# Patient Record
Sex: Male | Born: 1960
Health system: Southern US, Community
[De-identification: ages and names within clinical notes are randomized; demographics above are authoritative.]

## PROBLEM LIST (undated history)

## (undated) DIAGNOSIS — E0500 Thyrotoxicosis with diffuse goiter without thyrotoxic crisis or storm: Secondary | ICD-10-CM

## (undated) DIAGNOSIS — J189 Pneumonia, unspecified organism: Secondary | ICD-10-CM

## (undated) DIAGNOSIS — R1310 Dysphagia, unspecified: Secondary | ICD-10-CM

## (undated) DIAGNOSIS — E039 Hypothyroidism, unspecified: Secondary | ICD-10-CM

## (undated) DIAGNOSIS — E7800 Pure hypercholesterolemia, unspecified: Secondary | ICD-10-CM

## (undated) DIAGNOSIS — G35 Multiple sclerosis: Secondary | ICD-10-CM

## (undated) DIAGNOSIS — M6281 Muscle weakness (generalized): Secondary | ICD-10-CM

## (undated) HISTORY — PX: APPENDECTOMY: SHX54

---

## 2000-04-29 ENCOUNTER — Encounter: Admission: RE | Admit: 2000-04-29 | Discharge: 2000-04-29 | Payer: Self-pay | Admitting: Neurology

## 2000-06-21 ENCOUNTER — Emergency Department (HOSPITAL_COMMUNITY): Admission: EM | Admit: 2000-06-21 | Discharge: 2000-06-22 | Payer: Self-pay | Admitting: Emergency Medicine

## 2000-10-06 ENCOUNTER — Emergency Department (HOSPITAL_COMMUNITY): Admission: EM | Admit: 2000-10-06 | Discharge: 2000-10-07 | Payer: Self-pay | Admitting: Emergency Medicine

## 2000-10-14 ENCOUNTER — Encounter: Payer: Self-pay | Admitting: Neurology

## 2000-10-14 ENCOUNTER — Ambulatory Visit (HOSPITAL_COMMUNITY): Admission: RE | Admit: 2000-10-14 | Discharge: 2000-10-14 | Payer: Self-pay | Admitting: Neurology

## 2000-11-20 ENCOUNTER — Encounter: Payer: Self-pay | Admitting: Neurosurgery

## 2000-11-20 ENCOUNTER — Observation Stay (HOSPITAL_COMMUNITY): Admission: RE | Admit: 2000-11-20 | Discharge: 2000-11-20 | Payer: Self-pay | Admitting: Neurosurgery

## 2001-10-15 ENCOUNTER — Encounter: Admission: RE | Admit: 2001-10-15 | Discharge: 2001-10-15 | Payer: Self-pay | Admitting: Neurology

## 2002-11-22 ENCOUNTER — Encounter: Admission: RE | Admit: 2002-11-22 | Discharge: 2003-01-05 | Payer: Self-pay | Admitting: Psychiatry

## 2005-09-26 ENCOUNTER — Emergency Department (HOSPITAL_COMMUNITY): Admission: EM | Admit: 2005-09-26 | Discharge: 2005-09-27 | Payer: Self-pay | Admitting: Emergency Medicine

## 2006-06-15 ENCOUNTER — Encounter: Admission: RE | Admit: 2006-06-15 | Discharge: 2006-07-28 | Payer: Self-pay | Admitting: Neurology

## 2010-05-31 NOTE — Op Note (Signed)
[facility]  Patient:    Mark Kramer, Mark Kramer Visit Number: [id number] MRN: [id number]          Service Type: SUR Location: [facility] 3172 03 Attending Physician:  Abran Duke Dictated by:   Earnie Larsson, M.D. Proc. Date: 11/20/00 Admit Date:  11/20/2000 Discharge Date: 11/20/2000                             Operative Report  PREOPERATIVE DIAGNOSIS:  Right V3 trigeminal neuralgia.  POSTOPERATIVE DIAGNOSIS:  Right V3 trigeminal neuralgia.  PROCEDURE:  Right trigeminal balloon rhizotomy.  SURGEON:  Earnie Larsson, M.D.  ANESTHESIA:  General endotracheal.  INDICATIONS:  The patient is a 50 year old black male with multiple sclerosis who has intractable right-sided V3 distribution trigeminal neuralgia which has failed medical management.  The patient presents now for a right-sided balloon compression rhizotomy in hopes of alleviating some of his symptoms.  DESCRIPTION OF PROCEDURE:  The patient was brought to the operating room and placed on the operating table in the supine position.  After an adequate level of anesthesia was achieved, the patients left cheek was prepped and draped sterilely.  A 14-gauge Tuohy needle was then inserted approximately 2 cm lateral to the angle of his mouth, and passed along the jaw line along the superior border of his mandible.  This tracked along the pterygoid plate.  The needle was then directed under fluoroscopic guidance into the foramen ovale. This was confirmed by fluoroscopic guidance in both AP and lateral plane.  A #4 Fogarty catheter was then passed through the needle to the proper depth and entered into Meckels cave.  The balloon was inflated with radiopaque dye forming a classic pear shape confirmation within Meckels cave.  Compression continued for one minute total.  The balloon was deflated and the needle was removed.  There was no complication.  The patient tolerated the procedure well.  He returned to  the recovery room postoperative. Dictated by:   Earnie Larsson, M.D. Attending Physician:  Abran Duke DD:  11/20/00 TD:  11/21/00 Job: [id number]

## 2011-04-18 ENCOUNTER — Emergency Department (HOSPITAL_COMMUNITY)
Admission: EM | Admit: 2011-04-18 | Discharge: 2011-04-18 | Disposition: A | Discharge status: Home / Self Care | Payer: Managed Care, Other (non HMO) | Attending: Emergency Medicine | Admitting: Emergency Medicine

## 2011-04-18 ENCOUNTER — Encounter (HOSPITAL_COMMUNITY): Payer: Self-pay | Admitting: Emergency Medicine

## 2011-04-18 DIAGNOSIS — E0500 Thyrotoxicosis with diffuse goiter without thyrotoxic crisis or storm: Secondary | ICD-10-CM | POA: Insufficient documentation

## 2011-04-18 DIAGNOSIS — R5381 Other malaise: Secondary | ICD-10-CM | POA: Insufficient documentation

## 2011-04-18 DIAGNOSIS — G35 Multiple sclerosis: Secondary | ICD-10-CM

## 2011-04-18 HISTORY — DX: Multiple sclerosis: G35

## 2011-04-18 HISTORY — DX: Thyrotoxicosis with diffuse goiter without thyrotoxic crisis or storm: E05.00

## 2011-04-18 LAB — CBC
HCT: 49 % (ref 39.0–52.0)
Hemoglobin: 15.9 g/dL (ref 13.0–17.0)
MCH: 28.3 pg (ref 26.0–34.0)
MCHC: 32.4 g/dL (ref 30.0–36.0)
MCV: 87.2 fL (ref 78.0–100.0)
Platelets: 211 10*3/uL (ref 150–400)
RBC: 5.62 MIL/uL (ref 4.22–5.81)
RDW: 14 % (ref 11.5–15.5)
WBC: 5.6 10*3/uL (ref 4.0–10.5)

## 2011-04-18 LAB — BASIC METABOLIC PANEL
BUN: 10 mg/dL (ref 6–23)
Calcium: 9.9 mg/dL (ref 8.4–10.5)
Chloride: 100 mEq/L (ref 96–112)
Creatinine, Ser: 0.84 mg/dL (ref 0.50–1.35)
GFR calc Af Amer: >90 mL/min (ref >90)
GFR calc non Af Amer: >90 mL/min (ref >90)
Sodium: 141 mEq/L (ref 135–145)

## 2011-04-18 MED ORDER — PREDNISONE 20 MG PO TABS
60.0000 mg | ORAL_TABLET | Freq: Once | ORAL | Status: AC
  Administered 2011-04-18: 60 mg via ORAL
  Filled 2011-04-18: qty 3

## 2011-04-18 MED ORDER — PREDNISONE 10 MG PO TABS: 60.0000 mg | ORAL_TABLET | Freq: Every day | ORAL | Status: DC

## 2011-04-18 NOTE — ED Notes (Signed)
Per EMS.  Pt fell this am before going to adult daycare.  Pt denies complaints, pain or injury.  Pt has MS and is very shaky.  Pt did not want to come, but mother wanted pt to go.  Pt has been living by himself for the past month due to a separation, and is trying to get placement.  Pt stated to EMS that he has been talking to someone, but has not gotten to placement yet.  EMS states pt's living conditions unsafe, and pt is unable to take care of self.

## 2011-04-18 NOTE — Progress Notes (Signed)
Completed [facility] Referral to [facility] with confirmation Voice message left at [phone] to informing of change to [place] and requesting a return call to get update contact numbers for pt both home and mobile numbers listed on face sheet are disconnected

## 2011-04-18 NOTE — Progress Notes (Signed)
Called in home health referral to [facility] but services could not be started until next week for PT Called in referral to second choice of [place] Spoke with Debbie to complete referral Nursing to see pt first this weekend and other services to start soon after Pt updated and has contact information

## 2011-04-18 NOTE — ED Notes (Signed)
Case manager called.  Pt wants inpatient placement and needs social work consult.  Will tell MD.

## 2011-04-18 NOTE — ED Provider Notes (Signed)
History     CSN: [id number]  Arrival date & time 04/18/11  0917   First MD Initiated Contact with Patient 04/18/11 [phone]      Chief Complaint  Patient presents with  . Fall    The history is provided by the patient.   the patient has a history of multiple sclerosis and while he was on his way to adult daycare today he noticed he was more weak than usual.  He reports this is consistent with his MS exacerbations.  He is currently not on steroids.  His neurologist is in [place].  He currently lives at home by himself and his mother and his separated wife occasionally check in on him.  He would like to go to an assisted-living center and they have been looking into placement at an assisted living center but have been unsuccessful finding a place for this time.  He denies headache or confusion.  He's had no recent trauma.  He denies neck pain and back pain.  Past Medical History  Diagnosis Date  . Multiple sclerosis   . Grave's disease     History reviewed. No pertinent past surgical history.  History reviewed. No pertinent family history.  History  Substance Use Topics  . Smoking status: Never Smoker   . Smokeless tobacco: Not on file  . Alcohol Use: No      Review of Systems  All other systems reviewed and are negative.    Allergies  Review of patient's allergies indicates no known allergies.  Home Medications   Current Outpatient Rx  Name Route Sig Dispense Refill  . AMPHETAMINE-DEXTROAMPHETAMINE 20 MG PO TABS Oral Take 20 mg by mouth daily.    [name] CLONAZEPAM 0.5 MG PO TABS Oral Take 0.5 mg by mouth 2 (two) times daily as needed.    [name] DEXTROMETHORPHAN-QUINIDINE 20-10 MG PO CAPS Oral Take 1 capsule by mouth daily.    [name] ESCITALOPRAM OXALATE 20 MG PO TABS Oral Take 20 mg by mouth daily.    . IMIPRAMINE HCL 25 MG PO TABS Oral Take 50 mg by mouth at bedtime.    [name] LEVOTHYROXINE SODIUM 112 MCG PO TABS Oral Take 112 mcg by mouth daily.    [name] NADOLOL 80 MG PO  TABS Oral Take 80 mg by mouth daily.    [name] PRAVASTATIN SODIUM 20 MG PO TABS Oral Take 20 mg by mouth daily.    . QUETIAPINE FUMARATE 50 MG PO TABS Oral Take 50 mg by mouth at bedtime.      BP 143/80  Pulse 73  Temp(Src) 98.2 F (36.8 C) (Oral)  Resp 16  SpO2 100%  Physical Exam  Nursing note and vitals reviewed. Constitutional: He is oriented to person, place, and time. He appears well-developed and well-nourished.  HENT:  Head: Normocephalic and atraumatic.  Eyes: EOM are normal.  Neck: Normal range of motion.  Cardiovascular: Normal rate, regular rhythm, normal heart sounds and intact distal pulses.   Pulmonary/Chest: Effort normal and breath sounds normal. No respiratory distress.  Abdominal: Soft. He exhibits no distension. There is no tenderness.  Musculoskeletal: Normal range of motion.  Neurological: He is alert and oriented to person, place, and time.       Mild weakness of his bilateral upper and lower extremities without focal deficits  Skin: Skin is warm and dry.  Psychiatric: He has a normal mood and affect. Judgment normal.    ED Course  Procedures (including critical care time)  Labs Reviewed  CBC  BASIC METABOLIC PANEL   No results found.   1. Multiple sclerosis exacerbation       MDM  The patient is having exacerbation of his multiple sclerosis and requires additional assistance at home.  I spoke with both case management and social work to work on these issues.  Social work states is not a candidate due to his insurance time for placement into an assisted living facility at this time.  Case management was able to evaluate the patient and provide home health assistance to begin tomorrow.  The patient's mother is in the room and she will take him home back to [place] and return him back to his home and home health aides present tomorrow for additional assistance.  They will continue to work as an outpatient on placement into an assisted living  facility.  Will place the patient on prednisone for his multiple sclerosis exacerbation.  He has walkers and a wheelchair at home. I Have recommended close followup with his neurologist        Hoy Morn, MD 04/18/11 [phone]

## 2011-04-18 NOTE — ED Notes (Signed)
[id number] Expected date:<BR> Expected time:<BR> Means of arrival:<BR> Comments:<BR> Fall/ ems

## 2011-04-18 NOTE — Progress Notes (Signed)
ED CM updated ED RN. RN states labs just drawn for pt.  SW consult order needed

## 2011-04-18 NOTE — ED Notes (Signed)
Patient can stand in place with assistance although can not place any weigh on left leg.

## 2011-04-18 NOTE — Progress Notes (Signed)
ED CM made attempts throughout day to reach pt or mother at numbers provide mobile [phone] incorrect number) (home [phone] disconnected- old number for wife Mark Kramer) No return call from Mother Mark Kramer after vm x 1 left [phone]  Cm also attempted to reach pt's adult day care at [phone] which is a incorrect number and his pcp [facility] on [address] at [phone] which is an incorrect number Cm believes correct pcp is [facility] [phone] (Office) [phone] (Fax)

## 2011-04-18 NOTE — Progress Notes (Signed)
ED Cm received consult from Dr Venora Maples to assist pt with Home health services. Pt has [employer] managed coverage.  Cm spoke with pt and male friend at bedside about CM consult.  He has not used a home health agency previously He does not have medicaid coverage.  Pt states he prefers to go to assisted living states he has been working with a [place], Mr Laurann Montana at [phone] to get to an assisted living facility but can not recall the name of the assisted living facility. CM discussed process of referral to Assisted living with pt.  CM spoke with Dr Venora Maples about pt choice and left message for ED SW

## 2011-04-18 NOTE — ED Notes (Signed)
Talked to case management.  Pt to go home with mother for the night and home health to come to pt home tomorrow.

## 2011-04-18 NOTE — Progress Notes (Signed)
CSW spoke with patient and family who were bedside. Pt expressed interest in ALF, however, insurance will not pay for it.  Also, pt is said to make too much to qualify for Medicare A&B. CSW provided patient with list of ALF/FCH/RH in [facility]. CSW also reviewed steps on placement into these facilities so they can pursue if pt becomes eligible for Medicare which they are currently working on.  CSW provided family with list of habilitation services and advocacy groups in the area. Family appreciative for information and expressed no further needs at this time.  CSW signing off.  [place] Kenora Spayd ANN S , MSW, LCSWA 04/18/2011  12:16 PM [phone]

## 2011-05-20 ENCOUNTER — Encounter (HOSPITAL_COMMUNITY): Payer: Self-pay | Admitting: *Deleted

## 2011-05-20 ENCOUNTER — Emergency Department (HOSPITAL_COMMUNITY)
Admission: EM | Admit: 2011-05-20 | Discharge: 2011-05-20 | Disposition: A | Discharge status: Home / Self Care | Payer: Managed Care, Other (non HMO) | Attending: Emergency Medicine | Admitting: Emergency Medicine

## 2011-05-20 DIAGNOSIS — W010XXA Fall on same level from slipping, tripping and stumbling without subsequent striking against object, initial encounter: Secondary | ICD-10-CM | POA: Insufficient documentation

## 2011-05-20 DIAGNOSIS — S0990XA Unspecified injury of head, initial encounter: Secondary | ICD-10-CM | POA: Insufficient documentation

## 2011-05-20 DIAGNOSIS — S0081XA Abrasion of other part of head, initial encounter: Secondary | ICD-10-CM

## 2011-05-20 DIAGNOSIS — IMO0002 Reserved for concepts with insufficient information to code with codable children: Secondary | ICD-10-CM | POA: Insufficient documentation

## 2011-05-20 DIAGNOSIS — W19XXXA Unspecified fall, initial encounter: Secondary | ICD-10-CM

## 2011-05-20 DIAGNOSIS — G35 Multiple sclerosis: Secondary | ICD-10-CM | POA: Insufficient documentation

## 2011-05-20 DIAGNOSIS — E0500 Thyrotoxicosis with diffuse goiter without thyrotoxic crisis or storm: Secondary | ICD-10-CM | POA: Insufficient documentation

## 2011-05-20 NOTE — Progress Notes (Signed)
ED CM consulted with ED SW.  CM spoke with pt who states he did go to his mother's home after 04/18/11 ED visit Reports he was not seen by home health services per his mother decision. Then reports his mother got married and she assisted with getting him into a group home.  He reports not being happy at the group home but is a better situation than his living situation prior to 04/18/11.  Dr Venora Maples in to see pt

## 2011-05-20 NOTE — Progress Notes (Signed)
ED CM contacted by EMS staff about pt stating pt may need assisted living versus group home he is presently in. Referred to ED SW Pt has been seen by ED SW and ED CM previously and his coverage would not assist with disposition to assisted living CM previously assisted with home health services see 04/18/11 notes

## 2011-05-20 NOTE — ED Notes (Signed)
Pt has a noted abrasion to left cheek adjacent to eye

## 2011-05-20 NOTE — ED Notes (Signed)
Per EMS- pt in s/p fall, states he was getting up to lunch and he slipped, pt has MS, pt stays at a group home, pt with abrasions noted to left side of face, denies LOC or neck pain

## 2011-05-20 NOTE — Discharge Instructions (Signed)
Abrasions Abrasions are skin scrapes. Their treatment depends on how large and deep the abrasion is. Abrasions do not extend through all layers of the skin. A cut or lesion through all skin layers is called a laceration. HOME CARE INSTRUCTIONS   If you were given a dressing, change it at least once a day or as instructed by your caregiver. If the bandage sticks, soak it off with a solution of water or hydrogen peroxide.   Twice a day, wash the area with soap and water to remove all the cream/ointment. You may do this in a sink, under a tub faucet, or in a shower. Rinse off the soap and pat dry with a clean towel. Look for signs of infection (see below).   Reapply cream/ointment according to your caregiver's instruction. This will help prevent infection and keep the bandage from sticking. Telfa or gauze over the wound and under the dressing or wrap will also help keep the bandage from sticking.   If the bandage becomes wet, dirty, or develops a foul smell, change it as soon as possible.   Only take over-the-counter or prescription medicines for pain, discomfort, or fever as directed by your caregiver.  SEEK IMMEDIATE MEDICAL CARE IF:   Increasing pain in the wound.   Signs of infection develop: redness, swelling, surrounding area is tender to touch, or pus coming from the wound.   You have a fever.   Any foul smell coming from the wound or dressing.  Most skin wounds heal within ten days. Facial wounds heal faster. However, an infection may occur despite proper treatment. You should have the wound checked for signs of infection within 24 to 48 hours or sooner if problems arise. If you were not given a wound-check appointment, look closely at the wound yourself on the second day for early signs of infection listed above. MAKE SURE YOU:   Understand these instructions.   Will watch your condition.   Will get help right away if you are not doing well or get worse.  Document Released:  10/09/2004 Document Revised: 12/19/2010 Document Reviewed: 12/03/2010 [facility] Patient Information 2012 [place].Head Injury, Adult You have had a head injury that does not appear serious at this time. A concussion is a state of changed mental ability, usually from a blow to the head. You should take clear liquids for the rest of the day and then resume your regular diet. You should not take sedatives or alcoholic beverages for as long as directed by your caregiver after discharge. After injuries such as yours, most problems occur within the first 24 hours. SYMPTOMS These minor symptoms may be experienced after discharge:  Memory difficulties.   Dizziness.   Headaches.   Double vision.   Hearing difficulties.   Depression.   Tiredness.   Weakness.   Difficulty with concentration.  If you experience any of these problems, you should not be alarmed. A concussion requires a few days for recovery. Many patients with head injuries frequently experience such symptoms. Usually, these problems disappear without medical care. If symptoms last for more than one day, notify your caregiver. See your caregiver sooner if symptoms are becoming worse rather than better. HOME CARE INSTRUCTIONS   During the next 24 hours you must stay with someone who can watch you for the warning signs listed below.  Although it is unlikely that serious side effects will occur, you should be aware of signs and symptoms which may necessitate your return to this location. Side effects  may occur up to 7 - 10 days following the injury. It is important for you to carefully monitor your condition and contact your caregiver or seek immediate medical attention if there is a change in your condition. SEEK IMMEDIATE MEDICAL CARE IF:   There is confusion or drowsiness.   You can not awaken the injured person.   There is nausea (feeling sick to your stomach) or continued, forceful vomiting.   You notice dizziness or  unsteadiness which is getting worse, or inability to walk.   You have convulsions or unconsciousness.   You experience severe, persistent headaches not relieved by over-the-counter or prescription medicines for pain. (Do not take aspirin as this impairs clotting abilities). Take other pain medications only as directed.   You can not use arms or legs normally.   There is clear or bloody discharge from the nose or ears.  MAKE SURE YOU:   Understand these instructions.   Will watch your condition.   Will get help right away if you are not doing well or get worse.  Document Released: 12/30/2004 Document Revised: 12/19/2010 Document Reviewed: 11/17/2008 [facility] Patient Information 2012 [place].

## 2011-05-20 NOTE — ED Provider Notes (Signed)
History     CSN: [id number]  Arrival date & time 05/20/11  1236   First MD Initiated Contact with Patient 05/20/11 1537      Chief Complaint  Patient presents with  . Fall     The history is provided by the patient.   the patient has multiple sclerosis and reports he was getting up to go to lunch when he slipped.  He landed on the left side of his face and resulted in abrasions and left-sided his face.  He denies headache.  He had no loss consciousness.  He has no neck pain at this time.  He denies weakness of his upper lower extremities.  He denies chest pain or shortness of breath.  He denies palpitations.  Is not using anticoagulants.  He's had no recent illness.  He reports occasionally he has generalized weakness from his MS. his symptoms are mild at this time.  He is not requesting anything for pain.  Nothing worsens the symptoms.  Nothing improves his symptoms  Past Medical History  Diagnosis Date  . Multiple sclerosis   . Grave's disease     History reviewed. No pertinent past surgical history.  History reviewed. No pertinent family history.  History  Substance Use Topics  . Smoking status: Never Smoker   . Smokeless tobacco: Not on file  . Alcohol Use: No      Review of Systems  All other systems reviewed and are negative.    Allergies  Review of patient's allergies indicates no known allergies.  Home Medications   Current Outpatient Rx  Name Route Sig Dispense Refill  . AMPHETAMINE-DEXTROAMPHETAMINE 20 MG PO TABS Oral Take 20 mg by mouth daily.    [name] CLONAZEPAM 0.5 MG PO TABS Oral Take 0.5 mg by mouth 2 (two) times daily as needed.    [name] DEXTROMETHORPHAN-QUINIDINE 20-10 MG PO CAPS Oral Take 1 capsule by mouth daily.    [name] ESCITALOPRAM OXALATE 20 MG PO TABS Oral Take 20 mg by mouth daily.    . IMIPRAMINE HCL 25 MG PO TABS Oral Take 50 mg by mouth at bedtime.    [name] LEVOTHYROXINE SODIUM 112 MCG PO TABS Oral Take 112 mcg by mouth daily.    [name] NADOLOL 80 MG PO  TABS Oral Take 80 mg by mouth daily.    [name] PRAVASTATIN SODIUM 20 MG PO TABS Oral Take 20 mg by mouth daily.    . QUETIAPINE FUMARATE 50 MG PO TABS Oral Take 50 mg by mouth at bedtime.      BP 147/96  Pulse 61  Temp(Src) 98.3 F (36.8 C) (Oral)  Resp 15  SpO2 100%  Physical Exam  Nursing note and vitals reviewed. Constitutional: He is oriented to person, place, and time. He appears well-developed and well-nourished.  HENT:  Head: Normocephalic and atraumatic.       Superficial abrasions to the left side of the face.  Extraocular movements are intact.  No trismus or malocclusion.  Dentition is normal.  No soft tissue tenderness or swelling  Eyes: EOM are normal.  Neck: Normal range of motion. Neck supple.       No cervical spine tenderness.  C-spine is cleared by Nexus criteria  Cardiovascular: Normal rate, regular rhythm, normal heart sounds and intact distal pulses.   Pulmonary/Chest: Effort normal and breath sounds normal. No respiratory distress.  Abdominal: Soft. He exhibits no distension. There is no tenderness.  Musculoskeletal: Normal range of motion.  Neurological: He is alert and  oriented to person, place, and time.  Skin: Skin is warm and dry.  Psychiatric: He has a normal mood and affect. Judgment normal.    ED Course  Procedures (including critical care time)  Labs Reviewed - No data to display No results found.   1. Fall   2. Minor head injury   3. Abrasion of face       MDM  Minor head injury.  No loss of consciousness.  No indication for head CT.  Patient is not on anticoagulants.  Infection and head injury warnings were given.  Will provide standard wound care at bedside to his abrasions.  C-spine is cleared by Nexus criteria.  The focal signs mechanical in nature.  He has no weakness in his upper lower extremity.        Hoy Morn, MD 05/20/11 [phone]

## 2011-09-09 ENCOUNTER — Encounter (HOSPITAL_COMMUNITY): Payer: Self-pay | Admitting: Emergency Medicine

## 2011-09-09 ENCOUNTER — Emergency Department (HOSPITAL_COMMUNITY)
Admission: EM | Admit: 2011-09-09 | Discharge: 2011-09-09 | Disposition: A | Discharge status: Home / Self Care | Payer: Managed Care, Other (non HMO) | Attending: Emergency Medicine | Admitting: Emergency Medicine

## 2011-09-09 DIAGNOSIS — W1809XA Striking against other object with subsequent fall, initial encounter: Secondary | ICD-10-CM | POA: Insufficient documentation

## 2011-09-09 DIAGNOSIS — Z79899 Other long term (current) drug therapy: Secondary | ICD-10-CM | POA: Insufficient documentation

## 2011-09-09 DIAGNOSIS — G35 Multiple sclerosis: Secondary | ICD-10-CM | POA: Insufficient documentation

## 2011-09-09 DIAGNOSIS — E7800 Pure hypercholesterolemia, unspecified: Secondary | ICD-10-CM | POA: Insufficient documentation

## 2011-09-09 DIAGNOSIS — Y9301 Activity, walking, marching and hiking: Secondary | ICD-10-CM | POA: Insufficient documentation

## 2011-09-09 DIAGNOSIS — S01119A Laceration without foreign body of unspecified eyelid and periocular area, initial encounter: Secondary | ICD-10-CM | POA: Insufficient documentation

## 2011-09-09 DIAGNOSIS — S01111A Laceration without foreign body of right eyelid and periocular area, initial encounter: Secondary | ICD-10-CM

## 2011-09-09 DIAGNOSIS — W19XXXA Unspecified fall, initial encounter: Secondary | ICD-10-CM

## 2011-09-09 DIAGNOSIS — E0500 Thyrotoxicosis with diffuse goiter without thyrotoxic crisis or storm: Secondary | ICD-10-CM | POA: Insufficient documentation

## 2011-09-09 DIAGNOSIS — S058X9A Other injuries of unspecified eye and orbit, initial encounter: Secondary | ICD-10-CM | POA: Diagnosis not present

## 2011-09-09 HISTORY — DX: Pure hypercholesterolemia, unspecified: E78.00

## 2011-09-09 MED ORDER — LIDOCAINE-EPINEPHRINE-TETRACAINE (LET) SOLUTION
3.0000 mL | Freq: Once | NASAL | Status: AC
  Administered 2011-09-09: 3 mL via TOPICAL
  Filled 2011-09-09: qty 3

## 2011-09-09 NOTE — ED Notes (Signed)
Pt fell walking to restroom this am. Small abrasion over right eyebrow. No loc. History of MS.

## 2011-09-09 NOTE — ED Notes (Signed)
Patient being discharged-[facility] called. Staff Arlyss Repress informed of discharge and discharge instructions. Per staff EMS to be called for transport. EDP made aware.

## 2011-09-09 NOTE — ED Notes (Signed)
[facility] called and notified of EMS transport, Suanne Marker now working also given discharge instructions-verbalized understanding.

## 2011-09-09 NOTE — ED Provider Notes (Signed)
History  This chart was scribed for [name], MD by [name]. This patient was seen in room APA14/APA14 and the patient's care was started at 0758.   CSN: [id number]  Arrival date & time 09/09/11  [id number]   First MD Initiated Contact with Patient 09/09/11 [phone]      Chief Complaint  Patient presents with  . Fall   Patient is a 51 y.o. male presenting with fall. The history is provided by the patient and the EMS personnel. No language interpreter was used.  Fall The accident occurred 1 to 2 hours ago. The fall occurred while walking. He landed on a hard floor. There was no blood loss. Point of impact: small laceration right upper eyelid.  Pain location: no pain.  The patient is experiencing no pain. There was no entrapment after the fall. Pertinent negatives include no visual change, no fever, no abdominal pain, no nausea, no vomiting, no headaches and no loss of consciousness. Treatment on scene includes a c-collar and a backboard. He has tried nothing for the symptoms.   Mark Kramer is a 51 y.o. male brought in by ambulance, who presents to the Emergency Department complaining of a fall while using his walker to ambulate to the restroom this AM. He states that he fell to the floor and denies any LOC. He uses a walker primarily for his MS. He denies pain anywhere over his body, nausea, emesis, diarrhea, chest pain, weakness, dizziness, neck pain, HA and visual disturbances. He reports that he had little sleep last night because his roommate kept him up. He presents on long board and with C-collar in place by EMS.  PCP at [facility] family [facility], Dr. Zadie Rhine Dr. Leonie Man neurologist Dr. Mariea Stable his neurologist  Past Medical History  Diagnosis Date  . Multiple sclerosis   . Grave's disease   . High cholesterol     History reviewed. No pertinent past surgical history.  No family history on file.  History  Substance Use Topics  . Smoking status: Never Smoker   . Smokeless tobacco: Not  on file  . Alcohol Use: No   Lives in ALF Uses a walker  Review of Systems  Constitutional: Negative for fever and chills.  HENT: Negative for congestion and neck pain.   Respiratory: Negative for shortness of breath.   Cardiovascular: Negative for chest pain.  Gastrointestinal: Negative for nausea, vomiting, abdominal pain and diarrhea.  Musculoskeletal: Negative for back pain.  Skin: Positive for wound (Small laceration right upper eyelid. ).  Neurological: Negative for loss of consciousness, weakness and headaches.  All other systems reviewed and are negative.    Allergies  Review of patient's allergies indicates no known allergies.  Home Medications   Current Outpatient Rx  Name Route Sig Dispense Refill  . AMPHETAMINE-DEXTROAMPHETAMINE 20 MG PO TABS Oral Take 20 mg by mouth daily.    [name] CLONAZEPAM 0.5 MG PO TABS Oral Take 0.5 mg by mouth 2 (two) times daily as needed.    [name] DEXTROMETHORPHAN-QUINIDINE 20-10 MG PO CAPS Oral Take 1 capsule by mouth daily.    [name] ESCITALOPRAM OXALATE 20 MG PO TABS Oral Take 20 mg by mouth daily.    . IMIPRAMINE HCL 25 MG PO TABS Oral Take 50 mg by mouth at bedtime.    [name] LEVOTHYROXINE SODIUM 112 MCG PO TABS Oral Take 112 mcg by mouth daily.    [name] NADOLOL 80 MG PO TABS Oral Take 80 mg by mouth daily.    [name]  PRAVASTATIN SODIUM 20 MG PO TABS Oral Take 20 mg by mouth daily.    . QUETIAPINE FUMARATE 50 MG PO TABS Oral Take 50 mg by mouth at bedtime.      Triage Vitals: BP 135/90  Pulse 60  Temp 97.8 F (36.6 C)  Resp 16  Ht 6\' 2"  (1.88 m)  Wt 220 lb (99.791 kg)  BMI 28.25 kg/m2  SpO2 100%  Vital signs normal    Physical Exam  Nursing note and vitals reviewed. Constitutional: He is oriented to person, place, and time. He appears well-developed and well-nourished.  Non-toxic appearance. He does not appear ill. No distress.       Pt removed from backboard during my exam and C collar left in place.    HENT:  Head: Normocephalic and  atraumatic.  Right Ear: External ear normal.  Left Ear: External ear normal.  Nose: Nose normal. No mucosal edema or rhinorrhea.  Mouth/Throat: Oropharynx is clear and moist and mucous membranes are normal. No dental abscesses or uvula swelling.       Non tender to palpation of superior and inferior right orbital rims and his other facial bones.   Eyes: Conjunctivae and EOM are normal. Pupils are equal, round, and reactive to light.  Neck: Normal range of motion and full passive range of motion without pain. Neck supple.       Non tender to midline C-spine, no pain on ROM in all directions, C Collar was removed  Cardiovascular: Normal rate, regular rhythm and normal heart sounds.  Exam reveals no gallop and no friction rub.   No murmur heard. Pulmonary/Chest: Effort normal and breath sounds normal. No respiratory distress. He has no wheezes. He has no rhonchi. He has no rales. He exhibits no tenderness and no crepitus.  Abdominal: Soft. Normal appearance and bowel sounds are normal. He exhibits no distension. There is no tenderness. There is no rebound and no guarding.  Musculoskeletal: Normal range of motion. He exhibits no edema and no tenderness.       Moves all extremities well.   Neurological: He is alert and oriented to person, place, and time. He has normal strength. No cranial nerve deficit.  Skin: Skin is warm, dry and intact. No rash noted. No erythema. No pallor.       V shaped laceration 0.5 x 3/4 cm, lateral right upper eye lid with minimal swelling  Psychiatric: He has a normal mood and affect. His speech is normal and behavior is normal. His mood appears not anxious.    ED Course  Procedures (including critical care time)  Medications  lidocaine-EPINEPHrine-tetracaine (LET) solution (3 mL Topical Given 09/09/11 0826)     DIAGNOSTIC STUDIES: Oxygen Saturation is 100% on room air, normal by my interpretation.    COORDINATION OF CARE: At 800 AM Discussed treatment plan  with patient which includes repairing small laceration to right upper eyelid with derma bond . Patient agrees.   62 AM Applied  Derma bond to repair small laceration at patients right upper eyelid. Patient tolerated procedure well and in no pain or discomfort.    1. Fall   2. Laceration, eyelid, right    Plan discharge  Rolland Porter, MD, FACEP    MDM  I personally performed the services described in this documentation, which was scribed in my presence. The recorded information has been reviewed and considered.  Rolland Porter, MD, FACEP     [name], MD 09/09/11 [phone]

## 2012-04-08 ENCOUNTER — Encounter (HOSPITAL_COMMUNITY): Payer: Self-pay | Admitting: Emergency Medicine

## 2012-04-08 ENCOUNTER — Emergency Department (HOSPITAL_COMMUNITY): Payer: Medicare Other

## 2012-04-08 ENCOUNTER — Other Ambulatory Visit: Payer: Self-pay

## 2012-04-08 ENCOUNTER — Inpatient Hospital Stay (HOSPITAL_COMMUNITY)
Admission: EM | Admit: 2012-04-08 | Discharge: 2012-04-13 | DRG: 897 | Disposition: A | Discharge status: Skilled Nursing Facility | Payer: Medicare Other | Attending: Family Medicine | Admitting: Family Medicine

## 2012-04-08 DIAGNOSIS — E785 Hyperlipidemia, unspecified: Secondary | ICD-10-CM

## 2012-04-08 DIAGNOSIS — J9819 Other pulmonary collapse: Secondary | ICD-10-CM | POA: Diagnosis not present

## 2012-04-08 DIAGNOSIS — E7800 Pure hypercholesterolemia, unspecified: Secondary | ICD-10-CM | POA: Diagnosis present

## 2012-04-08 DIAGNOSIS — G35D Multiple sclerosis, unspecified: Secondary | ICD-10-CM | POA: Diagnosis not present

## 2012-04-08 DIAGNOSIS — T438X5A Adverse effect of other psychotropic drugs, initial encounter: Secondary | ICD-10-CM | POA: Diagnosis present

## 2012-04-08 DIAGNOSIS — G35 Multiple sclerosis: Secondary | ICD-10-CM | POA: Diagnosis present

## 2012-04-08 DIAGNOSIS — Z79899 Other long term (current) drug therapy: Secondary | ICD-10-CM

## 2012-04-08 DIAGNOSIS — I10 Essential (primary) hypertension: Secondary | ICD-10-CM | POA: Diagnosis present

## 2012-04-08 DIAGNOSIS — F19951 Other psychoactive substance use, unspecified with psychoactive substance-induced psychotic disorder with hallucinations: Principal | ICD-10-CM | POA: Diagnosis present

## 2012-04-08 DIAGNOSIS — R4182 Altered mental status, unspecified: Secondary | ICD-10-CM | POA: Diagnosis not present

## 2012-04-08 DIAGNOSIS — K5900 Constipation, unspecified: Secondary | ICD-10-CM | POA: Diagnosis present

## 2012-04-08 DIAGNOSIS — E0500 Thyrotoxicosis with diffuse goiter without thyrotoxic crisis or storm: Secondary | ICD-10-CM | POA: Diagnosis not present

## 2012-04-08 DIAGNOSIS — N390 Urinary tract infection, site not specified: Secondary | ICD-10-CM

## 2012-04-08 NOTE — ED Provider Notes (Addendum)
History     CSN: [id number]  Arrival date & time 04/08/12  2241   First MD Initiated Contact with Patient 04/08/12 2256      Chief Complaint  Patient presents with  . Altered Mental Status    (Consider location/radiation/quality/duration/timing/severity/associated sxs/prior treatment) HPI Comments: Patient brought by EMS with apparent change in mental status since noon today. Mother reported the patient was talking to people who were not there and speaking in only one-word sentences. Mother is not with the patient. Patient states he feels fine and is in no distress. He is oriented x3. Is answering questions appropriately. He denies any pain. He denies any recent illnesses, cough congestion, chest pain, shortness of breath or abdominal pain. Denies any recent medication changes. Has a history of multiple sclerosis and follows with  The history is provided by the patient and the EMS personnel. The history is limited by the absence of a caregiver and the condition of the patient.    Past Medical History  Diagnosis Date  . Multiple sclerosis   . Grave's disease   . High cholesterol     Past Surgical History  Procedure Laterality Date  . Appendectomy      No family history on file.  History  Substance Use Topics  . Smoking status: Never Smoker   . Smokeless tobacco: Not on file  . Alcohol Use: No      Review of Systems  Constitutional: Negative for fever, activity change and appetite change.  HENT: Negative for congestion and rhinorrhea.   Respiratory: Negative for cough, chest tightness and shortness of breath.   Cardiovascular: Negative for chest pain.  Gastrointestinal: Negative for nausea, vomiting and abdominal pain.  Genitourinary: Negative for dysuria and hematuria.  Musculoskeletal: Negative for back pain.  Skin: Negative for rash.  Neurological: Negative for dizziness, weakness and headaches.  Psychiatric/Behavioral: Positive for altered mental status.  A  complete 10 system review of systems was obtained and all systems are negative except as noted in the HPI and PMH.    Allergies  Review of patient's allergies indicates no known allergies.  Home Medications   No current outpatient prescriptions on file.  BP 157/88  Pulse 62  Temp(Src) 98 F (36.7 C) (Oral)  Resp 12  Ht 6\' 2"  (1.88 m)  Wt 188 lb 0.8 oz (85.3 kg)  BMI 24.13 kg/m2  SpO2 100%  Physical Exam  Constitutional: He is oriented to person, place, and time. He appears well-developed and well-nourished. No distress.  HENT:  Head: Normocephalic and atraumatic.  Mouth/Throat: Oropharynx is clear and moist. No oropharyngeal exudate.  Eyes: Conjunctivae and EOM are normal. Pupils are equal, round, and reactive to light.  Neck: Normal range of motion. Neck supple.  No meningimus  Cardiovascular: Normal rate, regular rhythm and normal heart sounds.   No murmur heard. Pulmonary/Chest: Effort normal and breath sounds normal. No respiratory distress.  Abdominal: Soft. There is no tenderness. There is no rebound and no guarding.  Musculoskeletal: Normal range of motion. He exhibits no edema and no tenderness.  Neurological: He is alert and oriented to person, place, and time. No cranial nerve deficit. He exhibits normal muscle tone. Coordination normal.  CN 2-12 intact, 5/5 strength upper extremitiest, 4/5 lower extremiteis. intention tremor with dysmetria  Skin: Skin is warm.    ED Course  Procedures (including critical care time)  Labs Reviewed  CBC WITH DIFFERENTIAL - Abnormal; Notable for the following:    Lymphocytes Relative 10 (*)  Lymphs Abs 0.4 (*)    Monocytes Relative 20 (*)    All other components within normal limits  COMPREHENSIVE METABOLIC PANEL - Abnormal; Notable for the following:    BUN 26 (*)    All other components within normal limits  CBC - Abnormal; Notable for the following:    WBC 3.1 (*)    All other components within normal limits  BASIC  METABOLIC PANEL - Abnormal; Notable for the following:    BUN 26 (*)    All other components within normal limits  MRSA PCR SCREENING  TROPONIN I  URINALYSIS, ROUTINE W REFLEX MICROSCOPIC  SEDIMENTATION RATE  C-REACTIVE PROTEIN  TSH   Dg Chest 2 View  04/08/2012  *RADIOLOGY REPORT*  Clinical Data: Altered mental status.  CHEST - 2 VIEW  Comparison: 08/30/2008.  Findings: Lung volumes are lower than on prior.  Basilar atelectasis.  Low volumes accentuate the cardiopericardial silhouette.  There is probable mild cardiomegaly.  Mediastinal contours appear within normal limits.  There is no focal consolidation.  No pleural effusion.  IMPRESSION: Low volume chest with basilar atelectasis.  Probable mild cardiomegaly.   Original Report Authenticated By: Dereck Ligas, M.D.    Ct Head Wo Contrast  04/08/2012  *RADIOLOGY REPORT*  Clinical Data: Altered mental status.  CT HEAD WITHOUT CONTRAST  Technique:  Contiguous axial images were obtained from the base of the skull through the vertex without contrast.  Comparison: None  Findings: There is age advanced cerebral atrophy, ventriculomegaly and periventricular white matter disease.  No extra-axial fluid collections.  No CT findings for acute hemispheric infarction and/or intracranial hemorrhage.  The brainstem and cerebellum grossly normal.  The bony structures are intact.  No acute fracture.  The paranasal sinuses and mastoid air cells are clear.  The globes are intact.  IMPRESSION:  1.  Age advanced cerebral atrophy, ventriculomegaly and periventricular white matter disease. 2.  No acute intracranial findings or mass lesions.   Original Report Authenticated By: Marijo Sanes, M.D.      1. Multiple sclerosis   2. Altered mental status       MDM  Change in mental status with decreased fluency in speech since noon.  Patient denies complaint and is oriented x3.  Denies fever, chest pain, SOB.  Patient seemed to be at baseline on arrival.. However did  have fluctuating symptoms in ED with decreased responsiveness and fluency. Labs unremarkable.  D/w Dr. Doy Mince. She doubts MS exacerbation but patient will need seizure workup, MRI, EEG. Dr. Claria Dice to admit.     Date: 04/09/2012  Rate: 63  Rhythm: normal sinus rhythm  QRS Axis: normal  Intervals: normal  ST/T Wave abnormalities: normal  Conduction Disutrbances:none  Narrative Interpretation:   Old EKG Reviewed: none available      Ezequiel Essex, MD 04/09/12 [place], MD 04/09/12 [phone]

## 2012-04-08 NOTE — ED Notes (Signed)
Patient with ALOC since approx noon today.  Mother stated that patient was talking to persons that are not there.  He is now only speaking in one word sentences.  Patient is unable to answer appropriately.  Patient is usually CAOx3, able to speak in complete sentences.

## 2012-04-09 ENCOUNTER — Encounter (HOSPITAL_COMMUNITY): Payer: Self-pay | Admitting: Family Medicine

## 2012-04-09 ENCOUNTER — Inpatient Hospital Stay (HOSPITAL_COMMUNITY): Payer: Medicare Other

## 2012-04-09 DIAGNOSIS — E0500 Thyrotoxicosis with diffuse goiter without thyrotoxic crisis or storm: Secondary | ICD-10-CM | POA: Diagnosis not present

## 2012-04-09 DIAGNOSIS — N390 Urinary tract infection, site not specified: Secondary | ICD-10-CM

## 2012-04-09 DIAGNOSIS — G35 Multiple sclerosis: Secondary | ICD-10-CM

## 2012-04-09 DIAGNOSIS — E785 Hyperlipidemia, unspecified: Secondary | ICD-10-CM

## 2012-04-09 DIAGNOSIS — G35D Multiple sclerosis, unspecified: Secondary | ICD-10-CM

## 2012-04-09 DIAGNOSIS — R4182 Altered mental status, unspecified: Secondary | ICD-10-CM | POA: Diagnosis not present

## 2012-04-09 LAB — COMPREHENSIVE METABOLIC PANEL
AST: 19 U/L (ref 0–37)
Albumin: 3.7 g/dL (ref 3.5–5.2)
Alkaline Phosphatase: 88 U/L (ref 39–117)
Chloride: 99 mEq/L (ref 96–112)
Creatinine, Ser: 0.97 mg/dL (ref 0.50–1.35)
Potassium: 4.6 mEq/L (ref 3.5–5.1)
Total Bilirubin: 0.7 mg/dL (ref 0.3–1.2)
Total Protein: 7.5 g/dL (ref 6.0–8.3)

## 2012-04-09 LAB — URINALYSIS, ROUTINE W REFLEX MICROSCOPIC
Glucose, UA: NEGATIVE mg/dL
Ketones, ur: 40 mg/dL — AB
Specific Gravity, Urine: 1.036 — ABNORMAL HIGH (ref 1.005–1.030)
pH: 5.5 (ref 5.0–8.0)

## 2012-04-09 LAB — CBC WITH DIFFERENTIAL/PLATELET
Basophils Absolute: 0 10*3/uL (ref 0.0–0.1)
Basophils Relative: 0 % (ref 0–1)
Eosinophils Absolute: 0 10*3/uL (ref 0.0–0.7)
MCHC: 34 g/dL (ref 30.0–36.0)
Neutro Abs: 2.8 10*3/uL (ref 1.7–7.7)
Neutrophils Relative %: 69 % (ref 43–77)
RDW: 13.7 % (ref 11.5–15.5)

## 2012-04-09 LAB — CBC
HCT: 47.9 % (ref 39.0–52.0)
Hemoglobin: 16.2 g/dL (ref 13.0–17.0)
MCV: 86.5 fL (ref 78.0–100.0)
RDW: 13.8 % (ref 11.5–15.5)
WBC: 3.1 10*3/uL — ABNORMAL LOW (ref 4.0–10.5)

## 2012-04-09 LAB — BASIC METABOLIC PANEL
BUN: 26 mg/dL — ABNORMAL HIGH (ref 6–23)
Chloride: 101 mEq/L (ref 96–112)
Creatinine, Ser: 0.9 mg/dL (ref 0.50–1.35)
GFR calc Af Amer: >90 mL/min (ref >90)
Glucose, Bld: 75 mg/dL (ref 70–99)

## 2012-04-09 LAB — TSH: TSH: 2.629 u[IU]/mL (ref 0.350–4.500)

## 2012-04-09 LAB — URINE MICROSCOPIC-ADD ON

## 2012-04-09 MED ORDER — ESCITALOPRAM OXALATE 20 MG PO TABS
20.0000 mg | ORAL_TABLET | Freq: Every day | ORAL | Status: DC
  Administered 2012-04-09 – 2012-04-13 (×5): 20 mg via ORAL
  Filled 2012-04-09 (×5): qty 1

## 2012-04-09 MED ORDER — NADOLOL 80 MG PO TABS
80.0000 mg | ORAL_TABLET | Freq: Two times a day (BID) | ORAL | Status: DC
  Administered 2012-04-09 – 2012-04-13 (×9): 80 mg via ORAL
  Filled 2012-04-09 (×11): qty 1

## 2012-04-09 MED ORDER — SODIUM CHLORIDE 0.9 % IV SOLN
INTRAVENOUS | Status: DC
  Administered 2012-04-09 – 2012-04-11 (×3): via INTRAVENOUS

## 2012-04-09 MED ORDER — DEXTROMETHORPHAN-QUINIDINE 20-10 MG PO CAPS
1.0000 | ORAL_CAPSULE | Freq: Two times a day (BID) | ORAL | Status: DC
  Administered 2012-04-10 – 2012-04-11 (×3): 1 via ORAL
  Administered 2012-04-12: 11:00:00 via ORAL
  Administered 2012-04-12 – 2012-04-13 (×2): 1 via ORAL
  Filled 2012-04-09 (×10): qty 1

## 2012-04-09 MED ORDER — ACETAMINOPHEN 325 MG PO TABS
650.0000 mg | ORAL_TABLET | Freq: Four times a day (QID) | ORAL | Status: DC | PRN
  Administered 2012-04-11: 650 mg via ORAL
  Filled 2012-04-09: qty 2

## 2012-04-09 MED ORDER — GADOBENATE DIMEGLUMINE 529 MG/ML IV SOLN
18.0000 mL | Freq: Once | INTRAVENOUS | Status: AC
  Administered 2012-04-09: 18 mL via INTRAVENOUS

## 2012-04-09 MED ORDER — ONDANSETRON HCL 4 MG/2ML IJ SOLN: 4.0000 mg | Freq: Four times a day (QID) | INTRAMUSCULAR | Status: DC | PRN

## 2012-04-09 MED ORDER — CLONAZEPAM 1 MG PO TABS: 2.0000 mg | ORAL_TABLET | Freq: Every evening | ORAL | Status: DC | PRN

## 2012-04-09 MED ORDER — ONDANSETRON HCL 4 MG PO TABS: 4.0000 mg | ORAL_TABLET | Freq: Four times a day (QID) | ORAL | Status: DC | PRN

## 2012-04-09 MED ORDER — FINGOLIMOD HCL 0.5 MG PO CAPS
1.0000 | ORAL_CAPSULE | Freq: Every day | ORAL | Status: DC
  Administered 2012-04-10 – 2012-04-11 (×2): 0.5 mg via ORAL
  Administered 2012-04-12: 11:00:00 via ORAL
  Administered 2012-04-13: 0.5 mg via ORAL
  Filled 2012-04-09 (×2): qty 1

## 2012-04-09 MED ORDER — CLONAZEPAM 1 MG PO TABS
1.5000 mg | ORAL_TABLET | Freq: Every day | ORAL | Status: DC | PRN
  Filled 2012-04-09: qty 3

## 2012-04-09 MED ORDER — LEVOTHYROXINE SODIUM 100 MCG IV SOLR
75.0000 ug | Freq: Every day | INTRAVENOUS | Status: DC
  Administered 2012-04-09 – 2012-04-11 (×3): 75 ug via INTRAVENOUS
  Filled 2012-04-09 (×4): qty 5

## 2012-04-09 MED ORDER — ENOXAPARIN SODIUM 40 MG/0.4ML ~~LOC~~ SOLN
40.0000 mg | SUBCUTANEOUS | Status: DC
  Administered 2012-04-09 – 2012-04-12 (×4): 40 mg via SUBCUTANEOUS
  Filled 2012-04-09 (×5): qty 0.4

## 2012-04-09 MED ORDER — DEXTROSE 5 % IV SOLN
1.0000 g | INTRAVENOUS | Status: DC
  Administered 2012-04-09 – 2012-04-10 (×2): 1 g via INTRAVENOUS
  Filled 2012-04-09 (×4): qty 10

## 2012-04-09 MED ORDER — DESMOPRESSIN ACETATE 0.1 MG PO TABS
0.1000 mg | ORAL_TABLET | Freq: Every day | ORAL | Status: DC
  Administered 2012-04-09 – 2012-04-12 (×4): 0.1 mg via ORAL
  Filled 2012-04-09 (×6): qty 1

## 2012-04-09 MED ORDER — ACETAMINOPHEN 650 MG RE SUPP: 650.0000 mg | Freq: Four times a day (QID) | RECTAL | Status: DC | PRN

## 2012-04-09 MED ORDER — AMPHETAMINE-DEXTROAMPHETAMINE 10 MG PO TABS
20.0000 mg | ORAL_TABLET | Freq: Every day | ORAL | Status: DC
  Administered 2012-04-09 – 2012-04-13 (×5): 20 mg via ORAL
  Filled 2012-04-09 (×2): qty 2
  Filled 2012-04-09 (×2): qty 1
  Filled 2012-04-09: qty 2
  Filled 2012-04-09 (×2): qty 1

## 2012-04-09 MED ORDER — CLONAZEPAM 0.5 MG PO TABS: 0.5000 mg | ORAL_TABLET | Freq: Two times a day (BID) | ORAL | Status: DC | PRN

## 2012-04-09 NOTE — Procedures (Signed)
EEG report.  Brief clinical history: 52 years old male with MS admitted to the hospital with mental state changes and hallucinations. No prior history of frank epileptic seizures.  Technique: this is a 17 channel routine scalp EEG performed at the bedside with bipolar and monopolar montages arranged in accordance to the international 10/20 system of electrode placement.  One channel was dedicated to EKG recording.  The study was performed during wakefulness, drowsiness, and stage 2 sleep. No activating procedures performed during the test.  Description:In the wakeful state, the best background consisted of a low amplitude, posterior dominant, well sustained, symmetric and reactive 10 Hz rhythm. Drowsiness demonstrated dropout of the alpha rhythm. Stage 2 sleep showed symmetric and synchronous sleep spindles without intermixed epileptiform discharges. No focal or generalized epileptiform discharges noted.  No slowing seen.  EKG showed sinus rhythm.  Impression: this is a normal awake and asleep EEG. Please, be aware that a normal EEG does not exclude the possibility of epilepsy.  Clinical correlation is advised.  Dorian Pod, MD

## 2012-04-09 NOTE — Procedures (Signed)
No note

## 2012-04-09 NOTE — Consult Note (Signed)
Reason for Consult:Altered mental status Referring Physician: Claria Dice  CC: Hallucinations  HPI: Mark Kramer is an 52 y.o. male with a history of multiple sclerosis.  Was sent to the ED today secondary to hallucinations and not responding as fluently as usual to questioning.  When initially evaluated in the ED was felt to be at baseline.  Prior to discharge the patient was again evaluated and felt to not be responding appropriately.  Admission was recommended at that time.   Patient reports that he has been diagnosed with MS for about 10 years.  He is currently on Gilenya.  At baseline is ambulatory but requires an assistive device.  Patient does not seem to recall the hallucinations.  Feels he is here because someone was in a bad mood.  He is followed by a neurologist in [facility].  His last MRI of the brain was less than a year ago and performed at [facility].  Past Medical History  Diagnosis Date  . Multiple sclerosis   . Grave's disease   . High cholesterol     Past Surgical History  Procedure Laterality Date  . Appendectomy      Family history: Mother alive and well.  Siblings alive and well also.  Father died of MS.  Social History:  reports that he has never smoked. He does not have any smokeless tobacco history on file. He reports that he does not drink alcohol or use illicit drugs.  No Known Allergies  Medications: I have reviewed the patient's current medications. Prior to Admission:  Current outpatient prescriptions: amphetamine-dextroamphetamine (ADDERALL) 20 MG tablet, Take 20 mg by mouth daily., Disp: , Rfl: ;  cholecalciferol (VITAMIN D) 1000 UNITS tablet, Take 2,000 Units by mouth daily., Disp: , Rfl: ;   clonazePAM (KLONOPIN) 1 MG tablet, Take 0.5-1 mg by mouth 2 (two) times daily as needed for anxiety (1.5 tabs in the am, 2 tabs in the pm)., Disp: , Rfl:  desmopressin (DDAVP) 0.1 MG tablet, Take 0.1 mg by mouth at bedtime., Disp: , Rfl: ;    Dextromethorphan-Quinidine (NUEDEXTA) 20-10 MG CAPS, Take 1 capsule by mouth 2 (two) times daily. , Disp: , Rfl: escitalopram (LEXAPRO) 20 MG tablet, Take 20 mg by mouth daily., Disp: , Rfl: ;   Fingolimod HCl (GILENYA) 0.5 MG CAPS, Take 1 capsule by mouth daily. For Multiple Sclerosis, Disp: , Rfl:  imipramine (TOFRANIL) 25 MG tablet, Take 50 mg by mouth at bedtime., Disp: , Rfl: ;   levothyroxine (SYNTHROID, LEVOTHROID) 112 MCG tablet, Take 112 mcg by mouth daily., Disp: , Rfl: ;   nadolol (CORGARD) 80 MG tablet, Take 80 mg by mouth 2 (two) times daily. , Disp: , Rfl: ;   pravastatin (PRAVACHOL) 20 MG tablet, Take 20 mg by mouth daily., Disp: , Rfl: ;   QUEtiapine (SEROQUEL) 50 MG tablet, Take 50 mg by mouth at bedtime., Disp: , Rfl:   ROS: History obtained from the patient  General ROS: negative for - chills, fatigue, fever, night sweats, weight gain or weight loss Psychological ROS: negative for - behavioral disorder, hallucinations, memory difficulties, mood swings or suicidal ideation Ophthalmic ROS: negative for - blurry vision, double vision, eye pain or loss of vision ENT ROS: negative for - epistaxis, nasal discharge, oral lesions, sore throat, tinnitus or vertigo Allergy and Immunology ROS: negative for - hives or itchy/watery eyes Hematological and Lymphatic ROS: negative for - bleeding problems, bruising or swollen lymph nodes Endocrine ROS: negative for - galactorrhea, hair  pattern changes, polydipsia/polyuria or temperature intolerance Respiratory ROS: negative for - cough, hemoptysis, shortness of breath or wheezing Cardiovascular ROS: negative for - chest pain, dyspnea on exertion, edema or irregular heartbeat Gastrointestinal ROS: negative for - abdominal pain, diarrhea, hematemesis, nausea/vomiting or stool incontinence Genito-Urinary ROS: negative for - dysuria, hematuria, incontinence or urinary frequency/urgency Musculoskeletal ROS: negative for - joint swelling or  muscular weakness Neurological ROS: as noted in HPI Dermatological ROS: negative for rash and skin lesion changes  Physical Examination: Blood pressure 152/78, pulse 62, temperature 98.9 F (37.2 C), temperature source Oral, resp. rate 14, SpO2 100.00%.  Neurologic Examination Mental Status: Alert, oriented, thought content appropriate.  Speech fluent without evidence of aphasia.  Able to follow 3 step commands without difficulty. Cranial Nerves: II: Discs flat bilaterally; Visual fields grossly normal, pupils equal, round, reactive to light and accommodation III,IV, VI: ptosis not present, extra-ocular motions intact bilaterally V,VII: smile symmetric, facial light touch sensation normal bilaterally VIII: hearing normal bilaterally IX,X: gag reflex present XI: bilateral shoulder shrug XII: midline tongue extension Motor: Able to lift both arms off bed and maintain against gravity.  Lifts lower extremities off bed minimally  Tone increased throughout.  Edema noted in the lower extremities.   Sensory: Pinprick and light touch intact throughout, bilaterally Deep Tendon Reflexes: 1+ in the upper extremities and absent in the lower extremities.   Plantars: Right: equivocal   Left: upgoing Cerebellar: Finger-to-nose markedly dysmetric in the upper extremities.  Unable to perform heel-to-shin testing Gait: Unable to test CV: pulses palpable throughout   Laboratory Studies:   Basic Metabolic Panel:  Recent Labs Lab 04/08/12 2345  NA 139  K 4.6  CL 99  CO2 29  GLUCOSE 80  BUN 26*  CREATININE 0.97  CALCIUM 9.9    Liver Function Tests:  Recent Labs Lab 04/08/12 2345  AST 19  ALT 12  ALKPHOS 88  BILITOT 0.7  PROT 7.5  ALBUMIN 3.7   No results found for this basename: LIPASE, AMYLASE,  in the last 168 hours No results found for this basename: AMMONIA,  in the last 168 hours  CBC:  Recent Labs Lab 04/08/12 2345  WBC 4.0  NEUTROABS 2.8  HGB 16.6  HCT 48.8  MCV  84.7  PLT 180    Cardiac Enzymes:  Recent Labs Lab 04/08/12 2345  TROPONINI <0.30    BNP: No components found with this basename: POCBNP,   CBG: No results found for this basename: GLUCAP,  in the last 168 hours  Microbiology: No results found for this or any previous visit.  Coagulation Studies: No results found for this basename: LABPROT, INR,  in the last 72 hours  Urinalysis: No results found for this basename: COLORURINE, APPERANCEUR, LABSPEC, PHURINE, GLUCOSEU, HGBUR, BILIRUBINUR, KETONESUR, PROTEINUR, UROBILINOGEN, NITRITE, LEUKOCYTESUR,  in the last 168 hours  Lipid Panel:  No results found for this basename: chol, trig, hdl, cholhdl, vldl, ldlcalc    HgbA1C:  No results found for this basename: HGBA1C    Urine Drug Screen:   No results found for this basename: labopia, cocainscrnur, labbenz, amphetmu, thcu, labbarb    Alcohol Level: No results found for this basename: ETH,  in the last 168 hours   Imaging: Dg Chest 2 View  04/08/2012  *RADIOLOGY REPORT*  Clinical Data: Altered mental status.  CHEST - 2 VIEW  Comparison: 08/30/2008.  Findings: Lung volumes are lower than on prior.  Basilar atelectasis.  Low volumes accentuate the cardiopericardial silhouette.  There is  probable mild cardiomegaly.  Mediastinal contours appear within normal limits.  There is no focal consolidation.  No pleural effusion.  IMPRESSION: Low volume chest with basilar atelectasis.  Probable mild cardiomegaly.   Original Report Authenticated By: Dereck Ligas, M.D.    Ct Head Wo Contrast  04/08/2012  *RADIOLOGY REPORT*  Clinical Data: Altered mental status.  CT HEAD WITHOUT CONTRAST  Technique:  Contiguous axial images were obtained from the base of the skull through the vertex without contrast.  Comparison: None  Findings: There is age advanced cerebral atrophy, ventriculomegaly and periventricular white matter disease.  No extra-axial fluid collections.  No CT findings for acute  hemispheric infarction and/or intracranial hemorrhage.  The brainstem and cerebellum grossly normal.  The bony structures are intact.  No acute fracture.  The paranasal sinuses and mastoid air cells are clear.  The globes are intact.  IMPRESSION:  1.  Age advanced cerebral atrophy, ventriculomegaly and periventricular white matter disease. 2.  No acute intracranial findings or mass lesions.   Original Report Authenticated By: Marijo Sanes, M.D.      Assessment/Plan: 52 year old male with a known history of MS presenting with mental status changes.  Although the patient seemed likely near baseline when I evaluated him it seems that he has been fluctuating.  Although this fluctuating presentation makes an MS exacerbation less likely, can not rule out seizures and/or PML.  Patient is on Seroquel at home as well and it is unclear why.  Presentation may be more psychiatric in origin.    Recommendations: 1.  MRI of the brain with and without contrast.  Would not start high dose Solumedrol at this time.   2.  EEG 3.  Frequent neuro checks.   4.  Thyroid testing  Alexis Goodell, MD [facility] [phone] 04/09/2012, 2:39 AM

## 2012-04-09 NOTE — Progress Notes (Addendum)
[facility] PROGRESS NOTE  Mark Kramer [id number] DOB: [birth year] DOA: 04/08/2012 PCP: Default, Provider, MD  Assessment/Plan: 1. Altered mental status- ? Cause, has abnormal UA, will obtain the urine culture and start IV rocephin.  2. Multiple sclerosis- MRI Btain shows MS. EEG is ordered, neurology following.   Code Status: Full code Family Communication:  Called the rest home and talked to patient's attendent Disposition Plan: To be determined   Consultants:  Neurology  Procedures:  None  Antibiotics:  None  HPI/Subjective: Patient seen and examined, still confused. Admitted with altered mental status, UA shows positive nitrite and leukocytes.  Objective: Filed Vitals:   04/09/12 0800 04/09/12 0812 04/09/12 1230 04/09/12 1321  BP: 149/68  157/84   Pulse: 68   64  Temp:  98 F (36.7 C) 98.4 F (36.9 C)   TempSrc:  Oral Oral   Resp: 20     Height:      Weight:      SpO2: 99%       Intake/Output Summary (Last 24 hours) at 04/09/12 1422 Last data filed at 04/09/12 0900  Gross per 24 hour  Intake 438.33 ml  Output    150 ml  Net 288.33 ml   Filed Weights   04/09/12 0343  Weight: 85.3 kg (188 lb 0.8 oz)    Exam:   General:  Appear confused  Cardiovascular: s1s2 RRR  Respiratory: Clear B/L  Abdomen: Soft, nontender  Musculoskeletal: No edema in lower extremities   Data Reviewed: Basic Metabolic Panel:  Recent Labs Lab 04/08/12 2345 04/09/12 0745  NA 139 138  K 4.6 3.6  CL 99 101  CO2 29 25  GLUCOSE 80 75  BUN 26* 26*  CREATININE 0.97 0.90  CALCIUM 9.9 9.1   Liver Function Tests:  Recent Labs Lab 04/08/12 2345  AST 19  ALT 12  ALKPHOS 88  BILITOT 0.7  PROT 7.5  ALBUMIN 3.7   No results found for this basename: LIPASE, AMYLASE,  in the last 168 hours No results found for this basename: AMMONIA,  in the last 168 hours CBC:  Recent Labs Lab 04/08/12 2345 04/09/12 0745  WBC 4.0 3.1*  NEUTROABS 2.8  --   HGB  16.6 16.2  HCT 48.8 47.9  MCV 84.7 86.5  PLT 180 174   Cardiac Enzymes:  Recent Labs Lab 04/08/12 2345  TROPONINI <0.30   BNP (last 3 results) No results found for this basename: PROBNP,  in the last 8760 hours CBG: No results found for this basename: GLUCAP,  in the last 168 hours  Recent Results (from the past 240 hour(s))  MRSA PCR SCREENING     Status: None   Collection Time    04/09/12  3:47 AM      Result Value Range Status   MRSA by PCR NEGATIVE  NEGATIVE Final   Comment:            The GeneXpert MRSA Assay (FDA     approved for NASAL specimens     only), is one component of a     comprehensive MRSA colonization     surveillance program. It is not     intended to diagnose MRSA     infection nor to guide or     monitor treatment for     MRSA infections.     Studies: Dg Chest 2 View  04/08/2012  *RADIOLOGY REPORT*  Clinical Data: Altered mental status.  CHEST - 2 VIEW  Comparison: 08/30/2008.  Findings: Lung volumes are lower than on prior.  Basilar atelectasis.  Low volumes accentuate the cardiopericardial silhouette.  There is probable mild cardiomegaly.  Mediastinal contours appear within normal limits.  There is no focal consolidation.  No pleural effusion.  IMPRESSION: Low volume chest with basilar atelectasis.  Probable mild cardiomegaly.   Original Report Authenticated By: Dereck Ligas, M.D.    Ct Head Wo Contrast  04/08/2012  *RADIOLOGY REPORT*  Clinical Data: Altered mental status.  CT HEAD WITHOUT CONTRAST  Technique:  Contiguous axial images were obtained from the base of the skull through the vertex without contrast.  Comparison: None  Findings: There is age advanced cerebral atrophy, ventriculomegaly and periventricular white matter disease.  No extra-axial fluid collections.  No CT findings for acute hemispheric infarction and/or intracranial hemorrhage.  The brainstem and cerebellum grossly normal.  The bony structures are intact.  No acute fracture.  The  paranasal sinuses and mastoid air cells are clear.  The globes are intact.  IMPRESSION:  1.  Age advanced cerebral atrophy, ventriculomegaly and periventricular white matter disease. 2.  No acute intracranial findings or mass lesions.   Original Report Authenticated By: Marijo Sanes, M.D.    Mr [name] Wo Contrast  04/09/2012  *RADIOLOGY REPORT*  Clinical Data: Multiple sclerosis with altered mental status  MRI HEAD WITHOUT AND WITH CONTRAST  Technique:  Multiplanar, multiecho pulse sequences of the brain and surrounding structures were obtained according to standard protocol without and with intravenous contrast  Contrast: 66mL MULTIHANCE GADOBENATE DIMEGLUMINE 529 MG/ML IV SOLN  Comparison: CT 04/08/2012  Findings: Moderate to advanced atrophy for age with enlargement of the ventricles and subarachnoid space diffusely.  Diffuse periventricular white matter hyperintensity compatible with multiple sclerosis.  There is atrophy of the corpus callosum which shows increased signal at the callosal septal interface, typical for multiple sclerosis.  Mild hyperintensity in the medulla may be due to ischemia or multiple sclerosis.  Hyperintensity in the right lower pons appears to represent chronic ischemia.  Diffusion weighted imaging is negative.  No enhancing lesions are seen following contrast administration.  No intracranial hemorrhage or mass is identified.  IMPRESSION: Moderate to advanced atrophy.  Periventricular white matter abnormality diffusely compatible with chronic multiple sclerosis there is also some mild disease in the medulla which may be due to multiple sclerosis.  No active plaques are identified.   Original Report Authenticated By: Carl Best, M.D.     Scheduled Meds: . amphetamine-dextroamphetamine  20 mg Oral Daily  . desmopressin  0.1 mg Oral QHS  . Dextromethorphan-Quinidine  1 capsule Oral BID  . enoxaparin (LOVENOX) injection  40 mg Subcutaneous Q24H  . escitalopram  20 mg Oral Daily   . Fingolimod HCl  1 capsule Oral Daily  . levothyroxine  75 mcg Intravenous QAC breakfast  . nadolol  80 mg Oral BID   Continuous Infusions: . sodium chloride 100 mL/hr at 04/09/12 0900    Principal Problem:   Multiple sclerosis Active Problems:   Graves disease   Dyslipidemia   Altered mental status    Time spent: 36 min    [facility] Pager [phone]. If 7PM-7AM, please contact night-coverage at [email or web address], password [facility] 04/09/2012, 2:22 PM  LOS: 1 day

## 2012-04-09 NOTE — Progress Notes (Signed)
EEG COMPLETED

## 2012-04-09 NOTE — H&P (Signed)
PCP:   Dr. Mariea Stable   Chief Complaint:  weakness  HPI: This is a perfusion with known history of multiple sclerosis, who was brought in for some confusion and weakness. In the ear the patient has become progressively weaker and more monosyllabic. During my interview patient is alert and oriented but very sluggish in response and scant in his answers. He reports no fevers, chills, nausea, vomiting, burning on urination, cough. He denies any pain or any evidence of infection. He says this doesn't feel like his usual MS. He denies any respiratory issues/difficulty.  Review of Systems:  Difficult to obtain but as best able to alternate points systems reviewed and negative except as noted in history of present illness.  Past Medical History: Past Medical History  Diagnosis Date  . Multiple sclerosis   . Grave's disease   . High cholesterol    Past Surgical History  Procedure Laterality Date  . Appendectomy      Medications: Prior to Admission medications   Medication Sig Start Date End Date Taking? Authorizing Provider  amphetamine-dextroamphetamine (ADDERALL) 20 MG tablet Take 20 mg by mouth daily.   Yes Historical Provider, MD  cholecalciferol (VITAMIN D) 1000 UNITS tablet Take 2,000 Units by mouth daily.   Yes Historical Provider, MD  clonazePAM (KLONOPIN) 1 MG tablet Take 0.5-1 mg by mouth 2 (two) times daily as needed for anxiety (1.5 tabs in the am, 2 tabs in the pm).   Yes Historical Provider, MD  desmopressin (DDAVP) 0.1 MG tablet Take 0.1 mg by mouth at bedtime.   Yes Historical Provider, MD  Dextromethorphan-Quinidine (NUEDEXTA) 20-10 MG CAPS Take 1 capsule by mouth 2 (two) times daily.    Yes Historical Provider, MD  escitalopram (LEXAPRO) 20 MG tablet Take 20 mg by mouth daily.   Yes Historical Provider, MD  Fingolimod HCl (GILENYA) 0.5 MG CAPS Take 1 capsule by mouth daily. For Multiple Sclerosis   Yes Historical Provider, MD  imipramine (TOFRANIL) 25 MG tablet Take 50 mg by  mouth at bedtime.   Yes Historical Provider, MD  levothyroxine (SYNTHROID, LEVOTHROID) 112 MCG tablet Take 112 mcg by mouth daily.   Yes Historical Provider, MD  nadolol (CORGARD) 80 MG tablet Take 80 mg by mouth 2 (two) times daily.    Yes Historical Provider, MD  pravastatin (PRAVACHOL) 20 MG tablet Take 20 mg by mouth daily.   Yes Historical Provider, MD  QUEtiapine (SEROQUEL) 50 MG tablet Take 50 mg by mouth at bedtime.   Yes Historical Provider, MD    Allergies:  No Known Allergies  Social History:  reports that he has never smoked. He does not have any smokeless tobacco history on file. He reports that he does not drink alcohol or use illicit drugs.  Family History: No family history of multiple sclerosis  Physical Exam: Filed Vitals:   04/09/12 0015 04/09/12 0100 04/09/12 0130 04/09/12 0200  BP: 115/63 106/55 125/76 161/87  Pulse: 66 66 65 65  Temp:      TempSrc:      Resp: 15 13 15 11   SpO2: 98% 96% 99% 99%    General:  Alert and oriented times three but slow and sluggish in response, well developed and nourished, no acute distress Eyes: PERRLA, pink conjunctiva, no scleral icterus ENT: Moist oral mucosa, neck supple, no thyromegaly Lungs: clear to ascultation, no wheeze, no crackles, no use of accessory muscles Cardiovascular: regular rate and rhythm, no regurgitation, no gallops, no murmurs. No carotid bruits, no JVD Abdomen:  soft, positive BS, non-tender, non-distended, no organomegaly, not an acute abdomen GU: not examined Neuro: CN II - XII grossly intact, sensation intact Musculoskeletal: strength 3/5 all extremities, no clubbing, cyanosis or edema Skin: no rash, no subcutaneous crepitation, no decubitus    Labs on Admission:   Recent Labs  04/08/12 2345  NA 139  K 4.6  CL 99  CO2 29  GLUCOSE 80  BUN 26*  CREATININE 0.97  CALCIUM 9.9    Recent Labs  04/08/12 2345  AST 19  ALT 12  ALKPHOS 88  BILITOT 0.7  PROT 7.5  ALBUMIN 3.7   No results  found for this basename: LIPASE, AMYLASE,  in the last 72 hours  Recent Labs  04/08/12 2345  WBC 4.0  NEUTROABS 2.8  HGB 16.6  HCT 48.8  MCV 84.7  PLT 180    Recent Labs  04/08/12 2345  TROPONINI <0.30    Micro Results: No results found for this or any previous visit (from the past 240 hour(s)).   Radiological Exams on Admission: Dg Chest 2 View  04/08/2012  *RADIOLOGY REPORT*  Clinical Data: Altered mental status.  CHEST - 2 VIEW  Comparison: 08/30/2008.  Findings: Lung volumes are lower than on prior.  Basilar atelectasis.  Low volumes accentuate the cardiopericardial silhouette.  There is probable mild cardiomegaly.  Mediastinal contours appear within normal limits.  There is no focal consolidation.  No pleural effusion.  IMPRESSION: Low volume chest with basilar atelectasis.  Probable mild cardiomegaly.   Original Report Authenticated By: Dereck Ligas, M.D.    Ct Head Wo Contrast  04/08/2012  *RADIOLOGY REPORT*  Clinical Data: Altered mental status.  CT HEAD WITHOUT CONTRAST  Technique:  Contiguous axial images were obtained from the base of the skull through the vertex without contrast.  Comparison: None  Findings: There is age advanced cerebral atrophy, ventriculomegaly and periventricular white matter disease.  No extra-axial fluid collections.  No CT findings for acute hemispheric infarction and/or intracranial hemorrhage.  The brainstem and cerebellum grossly normal.  The bony structures are intact.  No acute fracture.  The paranasal sinuses and mastoid air cells are clear.  The globes are intact.  IMPRESSION:  1.  Age advanced cerebral atrophy, ventriculomegaly and periventricular white matter disease. 2.  No acute intracranial findings or mass lesions.   Original Report Authenticated By: Marijo Sanes, M.D.     Assessment/Plan Present on Admission:  Generalized weakness Multiple sclerosis Admit to step down for overnight observation. Could this be an exacerbation of her  multiple sclerosis. Patient's physical examination has some inconsistencies were for this is unclear. Patient would not lift his arms for me, initially he did not neurology, however, when arms are held up he will keep them in that position, exhibits in fair strength. As stated neurology is on board. Currently no evidence of infection. Will order MRI of the brain and EEG  MS medications continued Seroquel held,  Check ESR and C-reactive protein Graves' disease Check TSH Dyslipidemia Home medications held  Full code DVT prophylaxis  Time in: 2 AM Time out: 2:30 AM  Nanie Dunkleberger 04/09/2012, 2:32 AM

## 2012-04-09 NOTE — Progress Notes (Signed)
Utilization review completed.  

## 2012-04-09 NOTE — Progress Notes (Signed)
Clinical Education officer, museum (CSW) informed that pt was admitted from a nursing facility however CSW unable to confirm from which facility. Pt presents confused, pt wife number is disconnected and number provided by mother is for [place]. CSW will await family to arrive to the hospital to complete assessment.  Assessment to follow.  Hunt Oris, MSW, [place]

## 2012-04-10 DIAGNOSIS — N390 Urinary tract infection, site not specified: Secondary | ICD-10-CM | POA: Diagnosis not present

## 2012-04-10 DIAGNOSIS — G35 Multiple sclerosis: Secondary | ICD-10-CM | POA: Diagnosis not present

## 2012-04-10 DIAGNOSIS — R4182 Altered mental status, unspecified: Secondary | ICD-10-CM | POA: Diagnosis not present

## 2012-04-10 LAB — URINE CULTURE: Colony Count: >=100000

## 2012-04-10 NOTE — Progress Notes (Signed)
Discharged to home with family.  No complaints.

## 2012-04-10 NOTE — Progress Notes (Addendum)
[facility] PROGRESS NOTE  Mark Kramer [id number] DOB: 01-19-1960 DOA: 04/08/2012 PCP: Default, Provider, MD  Assessment/Plan: 1. Altered mental status- ? Cause, has abnormal UA, urine culture is pending. Continue IV rocephin. 2. Multiple sclerosis- MRI Btain shows MS. EEG is ordered, neurology following.   Code Status: Full code Family Communication:  Called the rest home and talked to patient's attendent Disposition Plan: To be determined   Consultants:  Neurology  Procedures:  None  Antibiotics:  Rocephin 3/28>>>  HPI/Subjective: Patient seen and examined, mentation has improved somewhat, though still confused, says he is in hospital for surgery  Objective: Filed Vitals:   04/10/12 0934 04/10/12 1144 04/10/12 1201 04/10/12 1542  BP:  114/85    Pulse:  74    Temp: 98.4 F (36.9 C)  98 F (36.7 C) 98 F (36.7 C)  TempSrc: Oral  Oral Oral  Resp:  17    Height:      Weight:      SpO2:  97%      Intake/Output Summary (Last 24 hours) at 04/10/12 1552 Last data filed at 04/10/12 1542  Gross per 24 hour  Intake   2950 ml  Output   1350 ml  Net   1600 ml   Filed Weights   04/09/12 0343  Weight: 85.3 kg (188 lb 0.8 oz)    Exam:   General:  Appear confused  Cardiovascular: s1s2 RRR  Respiratory: Clear B/L  Abdomen: Soft, nontender  Musculoskeletal: No edema in lower extremities   Data Reviewed: Basic Metabolic Panel:  Recent Labs Lab 04/08/12 2345 04/09/12 0745  NA 139 138  K 4.6 3.6  CL 99 101  CO2 29 25  GLUCOSE 80 75  BUN 26* 26*  CREATININE 0.97 0.90  CALCIUM 9.9 9.1   Liver Function Tests:  Recent Labs Lab 04/08/12 2345  AST 19  ALT 12  ALKPHOS 88  BILITOT 0.7  PROT 7.5  ALBUMIN 3.7   No results found for this basename: LIPASE, AMYLASE,  in the last 168 hours No results found for this basename: AMMONIA,  in the last 168 hours CBC:  Recent Labs Lab 04/08/12 2345 04/09/12 0745  WBC 4.0 3.1*  NEUTROABS  2.8  --   HGB 16.6 16.2  HCT 48.8 47.9  MCV 84.7 86.5  PLT 180 174   Cardiac Enzymes:  Recent Labs Lab 04/08/12 2345  TROPONINI <0.30   BNP (last 3 results) No results found for this basename: PROBNP,  in the last 8760 hours CBG: No results found for this basename: GLUCAP,  in the last 168 hours  Recent Results (from the past 240 hour(s))  MRSA PCR SCREENING     Status: None   Collection Time    04/09/12  3:47 AM      Result Value Range Status   MRSA by PCR NEGATIVE  NEGATIVE Final   Comment:            The GeneXpert MRSA Assay (FDA     approved for NASAL specimens     only), is one component of a     comprehensive MRSA colonization     surveillance program. It is not     intended to diagnose MRSA     infection nor to guide or     monitor treatment for     MRSA infections.     Studies: Dg Chest 2 View  04/08/2012  *RADIOLOGY REPORT*  Clinical Data: Altered mental status.  CHEST - 2  VIEW  Comparison: 08/30/2008.  Findings: Lung volumes are lower than on prior.  Basilar atelectasis.  Low volumes accentuate the cardiopericardial silhouette.  There is probable mild cardiomegaly.  Mediastinal contours appear within normal limits.  There is no focal consolidation.  No pleural effusion.  IMPRESSION: Low volume chest with basilar atelectasis.  Probable mild cardiomegaly.   Original Report Authenticated By: Dereck Ligas, M.D.    Ct Head Wo Contrast  04/08/2012  *RADIOLOGY REPORT*  Clinical Data: Altered mental status.  CT HEAD WITHOUT CONTRAST  Technique:  Contiguous axial images were obtained from the base of the skull through the vertex without contrast.  Comparison: None  Findings: There is age advanced cerebral atrophy, ventriculomegaly and periventricular white matter disease.  No extra-axial fluid collections.  No CT findings for acute hemispheric infarction and/or intracranial hemorrhage.  The brainstem and cerebellum grossly normal.  The bony structures are intact.  No acute  fracture.  The paranasal sinuses and mastoid air cells are clear.  The globes are intact.  IMPRESSION:  1.  Age advanced cerebral atrophy, ventriculomegaly and periventricular white matter disease. 2.  No acute intracranial findings or mass lesions.   Original Report Authenticated By: Marijo Sanes, M.D.    Mr [name] Wo Contrast  04/09/2012  *RADIOLOGY REPORT*  Clinical Data: Multiple sclerosis with altered mental status  MRI HEAD WITHOUT AND WITH CONTRAST  Technique:  Multiplanar, multiecho pulse sequences of the brain and surrounding structures were obtained according to standard protocol without and with intravenous contrast  Contrast: 37mL MULTIHANCE GADOBENATE DIMEGLUMINE 529 MG/ML IV SOLN  Comparison: CT 04/08/2012  Findings: Moderate to advanced atrophy for age with enlargement of the ventricles and subarachnoid space diffusely.  Diffuse periventricular white matter hyperintensity compatible with multiple sclerosis.  There is atrophy of the corpus callosum which shows increased signal at the callosal septal interface, typical for multiple sclerosis.  Mild hyperintensity in the medulla may be due to ischemia or multiple sclerosis.  Hyperintensity in the right lower pons appears to represent chronic ischemia.  Diffusion weighted imaging is negative.  No enhancing lesions are seen following contrast administration.  No intracranial hemorrhage or mass is identified.  IMPRESSION: Moderate to advanced atrophy.  Periventricular white matter abnormality diffusely compatible with chronic multiple sclerosis there is also some mild disease in the medulla which may be due to multiple sclerosis.  No active plaques are identified.   Original Report Authenticated By: Carl Best, M.D.     Scheduled Meds: . amphetamine-dextroamphetamine  20 mg Oral Daily  . cefTRIAXone (ROCEPHIN)  IV  1 g Intravenous Q24H  . desmopressin  0.1 mg Oral QHS  . Dextromethorphan-Quinidine  1 capsule Oral BID  . enoxaparin (LOVENOX)  injection  40 mg Subcutaneous Q24H  . escitalopram  20 mg Oral Daily  . Fingolimod HCl  1 capsule Oral Daily  . levothyroxine  75 mcg Intravenous QAC breakfast  . nadolol  80 mg Oral BID   Continuous Infusions: . sodium chloride 100 mL/hr at 04/09/12 0900    Principal Problem:   Multiple sclerosis Active Problems:   Graves disease   Dyslipidemia   Altered mental status   UTI (urinary tract infection)    Time spent: 36 min    [facility] Pager [phone]. If 7PM-7AM, please contact night-coverage at [email or web address], password [facility] 04/10/2012, 3:52 PM  LOS: 2 days

## 2012-04-10 NOTE — Progress Notes (Signed)
NEURO HOSPITALIST PROGRESS NOTE   SUBJECTIVE:                                                                                                                        Offers no neurological complains. MRI-DWI showed no acute intracranial abnormality. EEG normal. Remains afebrile.   OBJECTIVE:                                                                                                                           Vital signs in last 24 hours: Temp:  [97.6 F (36.4 C)-98.5 F (36.9 C)] 98.5 F (36.9 C) (03/29 0435) Pulse Rate:  [64-75] 69 (03/29 0435) Resp:  [13-17] 17 (03/29 0435) BP: (142-157)/(70-84) 142/70 mmHg (03/29 0435) SpO2:  [97 %-100 %] 100 % (03/29 0435)  Intake/Output from previous day: 03/28 0701 - 03/29 0700 In: 2588.3 [I.V.:2538.3; IV Piggyback:50] Out: [id number] [Urine:675] Intake/Output this shift:   Nutritional status: General  Past Medical History  Diagnosis Date  . Multiple sclerosis   . Grave's disease   . High cholesterol     Neurologic ROS negative with exception of above.  Neurologic Exam:  Neurologic Examination  Mental Status:  Alert, awake, oriented, thought content appropriate. Comprehension, naming, and repetition normal. Speech fluent without evidence of aphasia. Able to follow 3 step commands without difficulty.  Cranial Nerves:  II: Discs flat bilaterally; Visual fields grossly normal, pupils equal, round, reactive to light and accommodation  III,IV, VI: ptosis not present, extra-ocular motions intact bilaterally  V,VII: smile symmetric, facial light touch sensation normal bilaterally  VIII: hearing normal bilaterally  IX,X: gag reflex present  XI: bilateral shoulder shrug  XII: midline tongue extension  Motor:  Able to lift both arms off bed and maintain against gravity. Lifts lower extremities off bed minimally  Tone increased throughout. Edema noted in the lower extremities.  Sensory: Pinprick and  light touch intact throughout, bilaterally  Deep Tendon Reflexes: 1+ in the upper extremities and absent in the lower extremities.  Plantars:  Right: equivocal Left: upgoing  Cerebellar:  Finger-to-nose markedly dysmetric in the upper extremities. Unable to perform heel-to-shin testing  Gait: Unable to test  CV: pulses palpable throughout    Lab Results: No results found for  this basename: cbc, bmp, coags, chol, tri, ldl, hga1c   Lipid Panel No results found for this basename: CHOL, TRIG, HDL, CHOLHDL, VLDL, LDLCALC,  in the last 72 hours  Studies/Results: Dg Chest 2 View  04/08/2012  *RADIOLOGY REPORT*  Clinical Data: Altered mental status.  CHEST - 2 VIEW  Comparison: 08/30/2008.  Findings: Lung volumes are lower than on prior.  Basilar atelectasis.  Low volumes accentuate the cardiopericardial silhouette.  There is probable mild cardiomegaly.  Mediastinal contours appear within normal limits.  There is no focal consolidation.  No pleural effusion.  IMPRESSION: Low volume chest with basilar atelectasis.  Probable mild cardiomegaly.   Original Report Authenticated By: Dereck Ligas, M.D.    Ct Head Wo Contrast  04/08/2012  *RADIOLOGY REPORT*  Clinical Data: Altered mental status.  CT HEAD WITHOUT CONTRAST  Technique:  Contiguous axial images were obtained from the base of the skull through the vertex without contrast.  Comparison: None  Findings: There is age advanced cerebral atrophy, ventriculomegaly and periventricular white matter disease.  No extra-axial fluid collections.  No CT findings for acute hemispheric infarction and/or intracranial hemorrhage.  The brainstem and cerebellum grossly normal.  The bony structures are intact.  No acute fracture.  The paranasal sinuses and mastoid air cells are clear.  The globes are intact.  IMPRESSION:  1.  Age advanced cerebral atrophy, ventriculomegaly and periventricular white matter disease. 2.  No acute intracranial findings or mass lesions.    Original Report Authenticated By: Marijo Sanes, M.D.    Mr [name] Wo Contrast  04/09/2012  *RADIOLOGY REPORT*  Clinical Data: Multiple sclerosis with altered mental status  MRI HEAD WITHOUT AND WITH CONTRAST  Technique:  Multiplanar, multiecho pulse sequences of the brain and surrounding structures were obtained according to standard protocol without and with intravenous contrast  Contrast: 15mL MULTIHANCE GADOBENATE DIMEGLUMINE 529 MG/ML IV SOLN  Comparison: CT 04/08/2012  Findings: Moderate to advanced atrophy for age with enlargement of the ventricles and subarachnoid space diffusely.  Diffuse periventricular white matter hyperintensity compatible with multiple sclerosis.  There is atrophy of the corpus callosum which shows increased signal at the callosal septal interface, typical for multiple sclerosis.  Mild hyperintensity in the medulla may be due to ischemia or multiple sclerosis.  Hyperintensity in the right lower pons appears to represent chronic ischemia.  Diffusion weighted imaging is negative.  No enhancing lesions are seen following contrast administration.  No intracranial hemorrhage or mass is identified.  IMPRESSION: Moderate to advanced atrophy.  Periventricular white matter abnormality diffusely compatible with chronic multiple sclerosis there is also some mild disease in the medulla which may be due to multiple sclerosis.  No active plaques are identified.   Original Report Authenticated By: Carl Best, M.D.     MEDICATIONS                                                                                                                       I have reviewed the patient's  current medications.  ASSESSMENT/PLAN:                                                                                                           Mr. Steinwand is doing well from a neurological standpoint. MRI-DWI and EEG unremarkable. No further neurological intervention needed at this moment. Will sign off.  Dorian Pod ,MD [facility] [phone]  04/10/2012, 8:14 AM

## 2012-04-11 DIAGNOSIS — R4182 Altered mental status, unspecified: Secondary | ICD-10-CM | POA: Diagnosis not present

## 2012-04-11 DIAGNOSIS — N390 Urinary tract infection, site not specified: Secondary | ICD-10-CM | POA: Diagnosis not present

## 2012-04-11 DIAGNOSIS — R404 Transient alteration of awareness: Secondary | ICD-10-CM

## 2012-04-11 DIAGNOSIS — G35 Multiple sclerosis: Secondary | ICD-10-CM | POA: Diagnosis not present

## 2012-04-11 MED ORDER — HYDRALAZINE HCL 20 MG/ML IJ SOLN
10.0000 mg | Freq: Four times a day (QID) | INTRAMUSCULAR | Status: DC | PRN
  Administered 2012-04-12: 10 mg via INTRAVENOUS
  Filled 2012-04-11: qty 1

## 2012-04-11 MED ORDER — HYDROCODONE-ACETAMINOPHEN 5-325 MG PO TABS: 1.0000 | ORAL_TABLET | ORAL | Status: DC | PRN

## 2012-04-11 MED ORDER — LEVOTHYROXINE SODIUM 112 MCG PO TABS
112.0000 ug | ORAL_TABLET | Freq: Every day | ORAL | Status: DC
  Administered 2012-04-12 – 2012-04-13 (×2): 112 ug via ORAL
  Filled 2012-04-11 (×3): qty 1

## 2012-04-11 MED ORDER — HYDRALAZINE HCL 20 MG/ML IJ SOLN
20.0000 mg | Freq: Once | INTRAMUSCULAR | Status: AC
  Administered 2012-04-11: 20 mg via INTRAVENOUS
  Filled 2012-04-11: qty 1

## 2012-04-11 NOTE — Consult Note (Signed)
Reason for Consult: AMS Referring Physician: unknown  Mark Kramer is an 52 y.o. male.  HPI:    Pt with known history of multiple sclerosis, who was brought in for some confusion and weakness. per chart patient has become progressively weaker and more monosyllabic. During interview patient is  very sluggish in response and scant in his answers.    appeared to be confused. he is not sure where he is now and also why he is here. Not sure if he has medical problems. Not sure if she needs help for anything.   Past Medical History  Diagnosis Date  . Multiple sclerosis   . Grave's disease   . High cholesterol     Past Surgical History  Procedure Laterality Date  . Appendectomy      No family history on file.  Social History:  reports that he has never smoked. He does not have any smokeless tobacco history on file. He reports that he does not drink alcohol or use illicit drugs.  Allergies: No Known Allergies  Medications: I have reviewed the patient's current medications.  No results found for this or any previous visit (from the past 48 hour(s)).  No results found.  ROS Blood pressure 167/96, pulse 66, temperature 98.2 F (36.8 C), temperature source Oral, resp. rate 13, height 6\' 2"  (1.88 m), weight 85.3 kg (188 lb 0.8 oz), SpO2 99.00%. Physical Exam  Mental Status Examination/Evaluation:   Appearance: on bed confused   Eye Contact::  poor   Speech: limited   Volume: low   Mood: no answer   Affect: ristricted   Thought Process: disorganized   Orientation: 1/4 person only   Thought Content: denies AVH   Suicidal Thoughts: No   Homicidal Thoughts: no   Memory: Recent; Poor   Judgement: Impaired   Insight: Lacking   Psychomotor Activity: slow   Concentration: poor   Recall: poor   Akathisia: No   Assessment:   AXIS I: Delirium  NOS AXIS II: Deferred   AXIS III: see emdical hx ?     AXIS IV: unknown   AXIS V:  15   ?   Treatment Plan/Recommendations:   - Likley delirium at this itme   - will recommend to continue work up at this. Consider risperidone 0.25 mg to 0.5 mg QHS for agitation   - will follow and continue evaluate   Raiford Simmonds 04/11/2012, 9:20 PM

## 2012-04-11 NOTE — Progress Notes (Signed)
Notified md bp 198/102, orders received will continue to monitor

## 2012-04-11 NOTE — Progress Notes (Signed)
[facility] PROGRESS NOTE  Mark Kramer [id number] DOB: [birth year] DOA: 04/08/2012 PCP: Default, Provider, MD  Assessment/Plan:  1. Altered mental status- resolved, says the psych medications have caused change in mental status.  Urine culture shows bacterial morphotype, will d/c the rocephin 2. Multiple sclerosis- MRI Brain shows MS, EEG is negative.   Code Status: Full code Family Communication:  Called the rest home and talked to patient's attendent Disposition Plan: To be determined   Consultants:  Neurology  Procedures:  None  Antibiotics:  Rocephin 3/28>>>3/30  HPI/Subjective: Patient seen and examined, mentation has improved, says the medications making altered mentals status. Objective: Filed Vitals:   04/11/12 0700 04/11/12 0721 04/11/12 1202 04/11/12 1224  BP:  164/91  147/95  Pulse:  72  63  Temp: 98.1 F (36.7 C)  98.1 F (36.7 C)   TempSrc: Oral  Oral   Resp:  13  13  Height:      Weight:      SpO2:  99%  98%    Intake/Output Summary (Last 24 hours) at 04/11/12 1430 Last data filed at 04/11/12 1202  Gross per 24 hour  Intake   2250 ml  Output   3400 ml  Net  -1150 ml   Filed Weights   04/09/12 0343  Weight: 85.3 kg (188 lb 0.8 oz)    Exam:   General:  Appear confused  Cardiovascular: s1s2 RRR  Respiratory: Clear B/L  Abdomen: Soft, nontender  Musculoskeletal: No edema in lower extremities   Data Reviewed: Basic Metabolic Panel:  Recent Labs Lab 04/08/12 2345 04/09/12 0745  NA 139 138  K 4.6 3.6  CL 99 101  CO2 29 25  GLUCOSE 80 75  BUN 26* 26*  CREATININE 0.97 0.90  CALCIUM 9.9 9.1   Liver Function Tests:  Recent Labs Lab 04/08/12 2345  AST 19  ALT 12  ALKPHOS 88  BILITOT 0.7  PROT 7.5  ALBUMIN 3.7   No results found for this basename: LIPASE, AMYLASE,  in the last 168 hours No results found for this basename: AMMONIA,  in the last 168 hours CBC:  Recent Labs Lab 04/08/12 2345 04/09/12 0745   WBC 4.0 3.1*  NEUTROABS 2.8  --   HGB 16.6 16.2  HCT 48.8 47.9  MCV 84.7 86.5  PLT 180 174   Cardiac Enzymes:  Recent Labs Lab 04/08/12 2345  TROPONINI <0.30   BNP (last 3 results) No results found for this basename: PROBNP,  in the last 8760 hours CBG: No results found for this basename: GLUCAP,  in the last 168 hours  Recent Results (from the past 240 hour(s))  MRSA PCR SCREENING     Status: None   Collection Time    04/09/12  3:47 AM      Result Value Range Status   MRSA by PCR NEGATIVE  NEGATIVE Final   Comment:            The GeneXpert MRSA Assay (FDA     approved for NASAL specimens     only), is one component of a     comprehensive MRSA colonization     surveillance program. It is not     intended to diagnose MRSA     infection nor to guide or     monitor treatment for     MRSA infections.  URINE CULTURE     Status: None   Collection Time    04/09/12 12:32 PM      Result  Value Range Status   Specimen Description URINE, CATHETERIZED   Final   Special Requests NONE   Final   Culture  Setup Time 04/09/2012 13:49   Final   Colony Count >=100,000 COLONIES/ML   Final   Culture     Final   Value: Multiple bacterial morphotypes present, none predominant. Suggest appropriate recollection if clinically indicated.   Report Status 04/10/2012 FINAL   Final     Studies: No results found.  Scheduled Meds: . amphetamine-dextroamphetamine  20 mg Oral Daily  . cefTRIAXone (ROCEPHIN)  IV  1 g Intravenous Q24H  . desmopressin  0.1 mg Oral QHS  . Dextromethorphan-Quinidine  1 capsule Oral BID  . enoxaparin (LOVENOX) injection  40 mg Subcutaneous Q24H  . escitalopram  20 mg Oral Daily  . Fingolimod HCl  1 capsule Oral Daily  . [START ON 04/12/2012] levothyroxine  112 mcg Oral QAC breakfast  . nadolol  80 mg Oral BID   Continuous Infusions: . sodium chloride 100 mL/hr at 04/10/12 2219    Principal Problem:   Multiple sclerosis Active Problems:   Graves disease    Dyslipidemia   Altered mental status   UTI (urinary tract infection)    Time spent: 36 min    [place] Pager [phone]. If 7PM-7AM, please contact night-coverage at [email or web address], password [facility] 04/11/2012, 2:30 PM  LOS: 3 days

## 2012-04-12 DIAGNOSIS — R4182 Altered mental status, unspecified: Secondary | ICD-10-CM | POA: Diagnosis not present

## 2012-04-12 DIAGNOSIS — R404 Transient alteration of awareness: Secondary | ICD-10-CM | POA: Diagnosis not present

## 2012-04-12 DIAGNOSIS — G35 Multiple sclerosis: Secondary | ICD-10-CM | POA: Diagnosis not present

## 2012-04-12 MED ORDER — HYDRALAZINE HCL 20 MG/ML IJ SOLN
20.0000 mg | Freq: Once | INTRAMUSCULAR | Status: AC
  Administered 2012-04-12: 20 mg via INTRAVENOUS
  Filled 2012-04-12: qty 1

## 2012-04-12 MED ORDER — RISPERIDONE 0.25 MG PO TABS
0.2500 mg | ORAL_TABLET | Freq: Every day | ORAL | Status: DC
  Administered 2012-04-12: 0.25 mg via ORAL
  Filled 2012-04-12 (×2): qty 1

## 2012-04-12 NOTE — Progress Notes (Addendum)
Clinical Social Work Department CLINICAL SOCIAL WORK PLACEMENT NOTE 04/12/2012  Patient:  Mark Kramer, Mark Kramer  Account Number:  [id number] [place] date:  04/08/2012  Clinical Social Worker:  Hunt Oris, Latanya Presser  Date/time:  04/12/2012 02:47 PM  Clinical Social Work is seeking post-discharge placement for this patient at the following level of care:   [facility]   (*CSW will update this form in Epic as items are completed)   04/12/2012  Patient/family provided with [place] Department of Clinical Social Work's list of facilities offering this level of care within the geographic area requested by the patient (or if unable, by the patient's family).  04/12/2012  Patient/family informed of their freedom to choose among providers that offer the needed level of care, that participate in Medicare, Medicaid or managed care program needed by the patient, have an available bed and are willing to accept the patient.  04/12/2012  Patient/family informed of [facility]' ownership interest in [facility], as well as of the fact that they are under no obligation to receive care at this facility.  PASARR submitted to EDS on 04/12/2012 PASARR number received from EDS on 04/12/2012  FL2 transmitted to all facilities in geographic area requested by pt/family on  04/12/2012 FL2 transmitted to all facilities within larger geographic area on   Patient informed that his/her managed care company has contracts with or will negotiate with  certain facilities, including the following:     Patient/family informed of bed offers received:  04/12/12 Patient chooses bed at [facility] Physician recommends and patient chooses bed at    Patient to be transferred to  on   Patient to be transferred to facility by   The following physician request were entered in Epic:   Additional Comments:  Hunt Oris, MSW, [place]

## 2012-04-12 NOTE — Progress Notes (Signed)
Pt to Mark Kramer to [id number], VSS, called report. Informed family of [place].

## 2012-04-12 NOTE — Clinical Social Work Psych Assess (Signed)
Clinical Social Work Department CLINICAL SOCIAL WORK PSYCHIATRY SERVICE LINE ASSESSMENT 04/12/2012  Patient:  Mark Kramer  Account:  [id number]  [place] Date:  04/08/2012  Clinical Social Worker:  Wylene Men  Date/Time:  04/12/2012 11:09 AM Referred by:  RN  Date referred:  04/12/2012 Reason for Referral  Behavioral Health Issues   Presenting Symptoms/Problems (In the persons/familys own words):   Altered Mental Status   Abuse/Neglect/Trauma History (check all that apply)  Denies history   Abuse/Neglect/Trauma Comments:   none reported or noted   Psychiatric History (check all that apply)  Denies history   Psychiatric medications:  Respirdone   Current Mental Health Hospitalizations/Previous Mental Health History:   none reported or noted   Current [place]e Huizinga:   none reported or noted   Place and Date:   none reported or noted   Current Medications:   Scheduled Meds:       amphetamine-dextroamphetamine  20 mg Oral Daily   desmopressin  0.1 mg Oral QHS   Dextromethorphan-Quinidine  1 capsule Oral BID   enoxaparin (LOVENOX) injection  40 mg Subcutaneous Q24H   escitalopram  20 mg Oral Daily   Fingolimod HCl  1 capsule Oral Daily   levothyroxine  112 mcg Oral QAC breakfast   nadolol  80 mg Oral BID   risperiDONE  0.25 mg Oral QHS        Continuous Infusions:       sodium chloride 50 mL/hr at 04/12/12 0230          PRN Meds:.acetaminophen, acetaminophen, clonazePAM, clonazePAM, hydrALAZINE, HYDROcodone-acetaminophen, ondansetron (ZOFRAN) IV, ondansetron       Previous Impatient Admission/Date/Reason:   none reported or noted   Emotional Health / Current Symptoms    Suicide/Self Harm  None reported   Suicide attempt in the past:   none reported or noted   Other harmful behavior:   none reported or noted   Psychotic/Dissociative Symptoms  Confusion   Other Psychotic/Dissociative Symptoms:   none reported or noted    Attention/Behavioral  Symptoms  Within Normal Limits   Other Attention / Behavioral Symptoms:   none reported or noted    Cognitive Impairment  Impairment due to current medical condition/treatment (stroke,reaction to medication,reaction to infections,etc)   Other Cognitive Impairment:   none reported or noted    Mood and Adjustment  Mood Congruent    Stress, Anxiety, Trauma, Any Recent Loss/Stressor  None reported   Anxiety (frequency):   none reported or noted   Phobia (specify):   none reported or noted   Compulsive behavior (specify):   none reported or noted   Obsessive behavior (specify):   none reported or noted   Other:   none reported or noted   Substance Abuse/Use  None   SBIRT completed (please refer for detailed history):  N  Self-reported substance use:   none reported or noted   Urinary Drug Screen Completed:  N Alcohol level:   none reported or noted    Environmental/Housing/Living Arrangement  Assisted Living / Group Home   Who is in the home:   pt from [facility] [phone]   Emergency contact:  Mother, Layman Altier  work: [phone]   [place]   Patients Strengths and Goals (patients own words):   Pt has supportive family who is active in healthcare decisions.   Clinical Social Workers Interpretive Summary:   Pt not oriented and remains confused.  Pt not good  historian.  CSW spoke to unit CSW who provided collateral contact information from assessment with Mother Fransico Him.  CSW reviewed the chart to find Rasul, MD (psychiatrist) has assessed the pt for possible psychosis and ruled this out. Psychiatrist recommends to continue medical workup.  CSW is signing off.  No further psych CSW needs identified.  Unit CSW to follow up on ALF and d/c needs.   Disposition:  Psych Clinical Social Worker signing off

## 2012-04-12 NOTE — Consult Note (Signed)
Reason for Consult: AMS Referring Physician: unknown  Mark Kramer is an 52 y.o. male.  HPI:    Pt with known history of multiple sclerosis, who was brought in for some confusion and weakness. per chart patient has become progressively weaker and more monosyllabic. During interview patient is  very sluggish in response and scant in his answers.    Interval Hx:    More calmer and talkative but appeared to be confused. he is not sure where he is now and also why he is here. Not sure if he has medical problems. Not sure if he needs help for anything. Reports living in group home before coming here.   Past Medical History  Diagnosis Date  . Multiple sclerosis   . Grave's disease   . High cholesterol     Past Surgical History  Procedure Laterality Date  . Appendectomy      No family history on file.  Social History:  reports that he has never smoked. He does not have any smokeless tobacco history on file. He reports that he does not drink alcohol or use illicit drugs.  Allergies: No Known Allergies  Medications: I have reviewed the patient's current medications.  No results found for this or any previous visit (from the past 48 hour(s)).  No results found.  Review of Systems  Gastrointestinal: Positive for constipation.   Blood pressure 147/82, pulse 69, temperature 97.8 F (36.6 C), temperature source Oral, resp. rate 15, height 6\' 2"  (1.88 m), weight 85.3 kg (188 lb 0.8 oz), SpO2 100.00%. Physical Exam   Mental Status Examination/Evaluation:   Appearance: on bed confused   Eye Contact::  poor   Speech: limited   Volume: low   Mood: no answer   Affect: ristricted   Thought Process: disorganized   Orientation: 1/4 person only   Thought Content: denies AVH   Suicidal Thoughts: No   Homicidal Thoughts: no   Memory: Recent; Poor   Judgement: Impaired   Insight: Lacking   Psychomotor Activity: slow   Concentration:  poor   Recall: poor   Akathisia: No   Assessment:   AXIS I: Delirium  NOS AXIS II: Deferred   AXIS III: see emdical hx ?     AXIS IV: unknown   AXIS V: 15   ?   Treatment Plan/Recommendations:   - Likley delirium at this and AMS ins improving   - will recommend to continue work up at this. Consider risperidone 0.25 mg to 0.5 mg QHS for agitation   - will follow and continue to evaluate   Raiford Simmonds 04/12/2012, 4:36 PM

## 2012-04-12 NOTE — Clinical Social Work Psychosocial (Signed)
     Clinical Social Work Department BRIEF PSYCHOSOCIAL ASSESSMENT 04/12/2012  Patient:  Mark Kramer, Mark Kramer     Account Number:  [id number]     Admit date:  04/08/2012  Clinical Social Worker:  Valda Lamb  Date/Time:  04/12/2012 10:15 AM  Referred by:  RN  Date Referred:  04/12/2012 Referred for  SNF Placement   Other Referral:   Interview type:  Family Other interview type:   CSW completed assessment with pt mother Mark Kramer [phone]    PSYCHOSOCIAL DATA Living Status:  FACILITY Admitted from facility:   Level of care:  Assisted Living Primary support name:  Mark Kramer [phone] Primary support relationship to patient:  PARENT Degree of support available:   Pt mother actively involved in pt care.    CURRENT CONCERNS Current Concerns  Post-Acute Placement   Other Concerns:    SOCIAL WORK ASSESSMENT / PLAN CSW informed that pt was admitted from a facility.    CSW attempted to assess pt however pt presents confused and unable to provide CSW with accurated information.    CSW contacted pt mother's work number Mark Kramer, Mark Kramer [phone]) and CSW introduced herself and role. Pt mother confirmed that pt was admitted from [facility] in [place]. Pt mother disclosed that she would prefer for pt to dc to a SNF. CSW inquired whether pt has Medicaid for LT care, mother stated pt does not have Medicaid and she believes due to his income being $1400. CSW inquired whether she has personally gone to apply for Medicaid, mother stated she has not. CSW informed mother of challenges with pt only having Medicare due to that only covering [place] [facility]. CSW explored how pt is paying for ALF and mother stated all of pt check $1400 goes to the facility and mother has also been paying out of pocket to cover his medications. CSW informed mother that CSW would staff case with supervisor.    CSW contacted [facility] and spoke with Mark Kramer who confirmed that pt is a resident of their  facility (has been there approx 36months,) and is welcome to return. Mark Kramer informed CSW that she does not believe that pt will qualify for Medicaid due to his income/ life insurance however Mark Kramer could not confirm whether a Medicaid application has been done. CSW informed that on day of discharge pt FL2/Dc summary are to be faxed to [phone] ATTN: Mark Kramer or Mark Kramer.    CSW will staff case with supervisor to determine whether CSW is to do a new SNF search for Mark Kramer as requested by mother however pt is difficult to place due to financial situation.   Assessment/plan status:  Psychosocial Support/Ongoing Assessment of Needs Other assessment/ plan:   Information/referral to community resources:   CSW to provide a SNF list and information on applying for Medicaid.    PATIENTS/FAMILYS RESPONSE TO PLAN OF CARE: Pt alert in room however presents confused. Pt mother is pt main contact as mother states that pt wife is no longer involved in pt life (divorced?). Pt mother requesting a new facility however made aware of challenges due to pt insurance situation.

## 2012-04-12 NOTE — Progress Notes (Signed)
NURSING PROGRESS NOTE  Mark Kramer YK:[id number] Transfer Data: 04/12/2012 5:05 PM Attending Provider: Oswald Hillock, MD SQ:[id number], Provider, MD Code Status: full  Mark Kramer is a 52 y.o. male patient transferred from 19 -No acute distress noted.  -No complaints of shortness of breath.  -No complaints of chest pain.   Blood pressure 147/86, pulse 70, temperature 98.1 F (36.7 C), temperature source Oral, resp. rate 20, height 6\' 2"  (1.88 m), weight 85.3 kg (188 lb 0.8 oz), SpO2 99.00%.   IV Fluids:  IV in place, occlusive dsg intact without redness, IV cath hand right, condition patent and no redness normal saline. Left AC SL.  Allergies:  Review of patient's allergies indicates no known allergies.  Past Medical History:   has a past medical history of Multiple sclerosis; Grave's disease; and High cholesterol.  Past Surgical History:   has past surgical history that includes Appendectomy.  Social History:   reports that he has never smoked. He does not have any smokeless tobacco history on file. He reports that he does not drink alcohol or use illicit drugs.  Skin: warm, dry and intact   Patient/Family orientated to room. Information packet given to patient/family. Admission inpatient armband information verified with patient/family to include name and date of birth and placed on patient arm. Side rails up x 2, fall assessment and education completed with patient/family. Patient/family able to verbalize understanding of risk associated with falls and verbalized understanding to call for assistance before getting out of bed. Call light within reach. Patient/family able to voice and demonstrate understanding of unit orientation instructions.    Will continue to evaluate and treat per MD orders.

## 2012-04-12 NOTE — Progress Notes (Signed)
[facility] PROGRESS NOTE  Mark Kramer [id number] DOB: 1961-01-07 DOA: 04/08/2012 PCP: Default, Provider, MD  Assessment/Plan:  1. Altered mental status- internittent, says the psych medications have caused change in mental status.  Urine culture shows bacterial morphotype, discontinued  the rocephin 2. Multiple sclerosis- MRI Brain shows MS, EEG is negative.   Code Status: Full code Family Communication:  Called the rest home and talked to patient's attendent Disposition Plan: To be determined   Consultants:  Neurology  Procedures:  None  Antibiotics:  Rocephin 3/28>>>3/30  HPI/Subjective: Patient seen and examined, fluctuating mentation, psych following. Objective: Filed Vitals:   04/12/12 0800 04/12/12 1000 04/12/12 1233 04/12/12 1700  BP: 148/81 157/90 147/82 147/86  Pulse: 66 69  70  Temp: 97.3 F (36.3 C)  97.8 F (36.6 C) 98.1 F (36.7 C)  TempSrc: Oral  Oral Oral  Resp: 13 15  20   Height:      Weight:      SpO2: 100% 100%  99%    Intake/Output Summary (Last 24 hours) at 04/12/12 1809 Last data filed at 04/12/12 1233  Gross per 24 hour  Intake   1245 ml  Output   1600 ml  Net   -355 ml   Filed Weights   04/09/12 0343  Weight: 85.3 kg (188 lb 0.8 oz)    Exam:   General:  Appear confused  Cardiovascular: s1s2 RRR  Respiratory: Clear B/L  Abdomen: Soft, nontender  Musculoskeletal: No edema in lower extremities   Data Reviewed: Basic Metabolic Panel:  Recent Labs Lab 04/08/12 2345 04/09/12 0745  NA 139 138  K 4.6 3.6  CL 99 101  CO2 29 25  GLUCOSE 80 75  BUN 26* 26*  CREATININE 0.97 0.90  CALCIUM 9.9 9.1   Liver Function Tests:  Recent Labs Lab 04/08/12 2345  AST 19  ALT 12  ALKPHOS 88  BILITOT 0.7  PROT 7.5  ALBUMIN 3.7   CBC:  Recent Labs Lab 04/08/12 2345 04/09/12 0745  WBC 4.0 3.1*  NEUTROABS 2.8  --   HGB 16.6 16.2  HCT 48.8 47.9  MCV 84.7 86.5  PLT 180 174   Cardiac Enzymes:  Recent  Labs Lab 04/08/12 2345  TROPONINI <0.30   BNP (last 3 results) No results found for this basename: PROBNP,  in the last 8760 hours CBG: No results found for this basename: GLUCAP,  in the last 168 hours  Recent Results (from the past 240 hour(s))  MRSA PCR SCREENING     Status: None   Collection Time    04/09/12  3:47 AM      Result Value Range Status   MRSA by PCR NEGATIVE  NEGATIVE Final   Comment:            The GeneXpert MRSA Assay (FDA     approved for NASAL specimens     only), is one component of a     comprehensive MRSA colonization     surveillance program. It is not     intended to diagnose MRSA     infection nor to guide or     monitor treatment for     MRSA infections.  URINE CULTURE     Status: None   Collection Time    04/09/12 12:32 PM      Result Value Range Status   Specimen Description URINE, CATHETERIZED   Final   Special Requests NONE   Final   Culture  Setup Time 04/09/2012 13:49  Final   Colony Count >=100,000 COLONIES/ML   Final   Culture     Final   Value: Multiple bacterial morphotypes present, none predominant. Suggest appropriate recollection if clinically indicated.   Report Status 04/10/2012 FINAL   Final     Studies: No results found.  Scheduled Meds: . amphetamine-dextroamphetamine  20 mg Oral Daily  . desmopressin  0.1 mg Oral QHS  . Dextromethorphan-Quinidine  1 capsule Oral BID  . enoxaparin (LOVENOX) injection  40 mg Subcutaneous Q24H  . escitalopram  20 mg Oral Daily  . Fingolimod HCl  1 capsule Oral Daily  . levothyroxine  112 mcg Oral QAC breakfast  . nadolol  80 mg Oral BID  . risperiDONE  0.25 mg Oral QHS   Continuous Infusions: . sodium chloride 50 mL/hr at 04/12/12 0230    Principal Problem:   Multiple sclerosis Active Problems:   Graves disease   Dyslipidemia   Altered mental status   UTI (urinary tract infection)    Time spent: 36 min    [facility] Pager [phone]. If 7PM-7AM,  please contact night-coverage at [email or web address], password [facility] 04/12/2012, 6:09 PM  LOS: 4 days

## 2012-04-12 NOTE — Clinical Social Work Psych Assess (Signed)
Clinical Social Work Department CLINICAL SOCIAL WORK PSYCHIATRY SERVICE LINE ASSESSMENT 04/12/2012  Patient:  Mark Kramer  Account:  [id number]  [place] Date:  04/08/2012  Clinical Social Worker:  Mark Kramer  Date/Time:  04/12/2012 11:09 AM Referred by:  RN  Date referred:  04/12/2012 Reason for Referral  Behavioral Health Issues   Presenting Symptoms/Problems (In the person's/family's own words):   Altered Mental Status   Abuse/Neglect/Trauma History (check all that apply)  Denies history   Abuse/Neglect/Trauma Comments:   none reported or noted   Psychiatric History (check all that apply)  Denies history   Psychiatric medications:  Respirdone   Current Mental Health Hospitalizations/Previous Mental Health History:   none reported or noted   Current provider:   none reported or noted   Place and Date:   none reported or noted   Current Medications:   Scheduled Meds:      . amphetamine-dextroamphetamine  20 mg Oral Daily  . desmopressin  0.1 mg Oral QHS  . Dextromethorphan-Quinidine  1 capsule Oral BID  . enoxaparin (LOVENOX) injection  40 mg Subcutaneous Q24H  . escitalopram  20 mg Oral Daily  . Fingolimod HCl  1 capsule Oral Daily  . levothyroxine  112 mcg Oral QAC breakfast  . nadolol  80 mg Oral BID  . risperiDONE  0.25 mg Oral QHS        Continuous Infusions:      . sodium chloride 50 mL/hr at 04/12/12 0230          PRN Meds:.acetaminophen, acetaminophen, clonazePAM, clonazePAM, hydrALAZINE, HYDROcodone-acetaminophen, ondansetron (ZOFRAN) IV, ondansetron       Previous Impatient Admission/Date/Reason:   none reported or noted   Emotional Health / Current Symptoms    Suicide/Self Harm  None reported   Suicide attempt in the past:   none reported or noted   Other harmful behavior:   none reported or noted   Psychotic/Dissociative Symptoms  Confusion   Other Psychotic/Dissociative Symptoms:   none reported or noted     Attention/Behavioral Symptoms  Within Normal Limits   Other Attention / Behavioral Symptoms:   none reported or noted    Cognitive Impairment  Impairment due to current medical condition/treatment (stroke,reaction to medication,reaction to infections,etc)   Other Cognitive Impairment:   none reported or noted    Mood and Adjustment  Mood Congruent    Stress, Anxiety, Trauma, Any Recent Loss/Stressor  None reported   Anxiety (frequency):   none reported or noted   Phobia (specify):   none reported or noted   Compulsive behavior (specify):   none reported or noted   Obsessive behavior (specify):   none reported or noted   Other:   none reported or noted   Substance Abuse/Use  None   SBIRT completed (please refer for detailed history):  N  Self-reported substance use:   none reported or noted   Urinary Drug Screen Completed:  N Alcohol level:   none reported or noted    Environmental/Housing/Living Arrangement  Assisted Living / Group Home   Who is in the home:   [name] [phone]   Emergency contact:  Mother, Mark Kramer  work: [employer]   Patient's Strengths and Goals (patient's own words):   Pt has supportive family who is active in healthcare decisions.   Clinical Social Worker's Interpretive Summary:   Pt not oriented and remains confused.  Pt not good historian.  CSW  spoke to unit CSW who provided collateral contact information from assessment with Mother Mark Kramer.  CSW reviewed the chart to find Rasul, MD (psychiatrist) has assessed the pt for possible psychosis and ruled this out. Psychiatrist recommends to continue medical workup.  CSW is signing off.  No further psych CSW needs identified.  Unit CSW to follow up on ALF and d/c needs.   Disposition:  Psych Clinical Social Worker signing off   Nonnie Done, [place] [phone]  Clinical Social Work

## 2012-04-13 DIAGNOSIS — R279 Unspecified lack of coordination: Secondary | ICD-10-CM | POA: Diagnosis not present

## 2012-04-13 DIAGNOSIS — N390 Urinary tract infection, site not specified: Secondary | ICD-10-CM | POA: Diagnosis not present

## 2012-04-13 DIAGNOSIS — I10 Essential (primary) hypertension: Secondary | ICD-10-CM | POA: Diagnosis not present

## 2012-04-13 DIAGNOSIS — E039 Hypothyroidism, unspecified: Secondary | ICD-10-CM | POA: Diagnosis not present

## 2012-04-13 DIAGNOSIS — R351 Nocturia: Secondary | ICD-10-CM | POA: Diagnosis not present

## 2012-04-13 DIAGNOSIS — G40909 Epilepsy, unspecified, not intractable, without status epilepticus: Secondary | ICD-10-CM | POA: Diagnosis not present

## 2012-04-13 DIAGNOSIS — R488 Other symbolic dysfunctions: Secondary | ICD-10-CM | POA: Diagnosis not present

## 2012-04-13 DIAGNOSIS — F329 Major depressive disorder, single episode, unspecified: Secondary | ICD-10-CM | POA: Diagnosis not present

## 2012-04-13 DIAGNOSIS — M6281 Muscle weakness (generalized): Secondary | ICD-10-CM | POA: Diagnosis not present

## 2012-04-13 DIAGNOSIS — E785 Hyperlipidemia, unspecified: Secondary | ICD-10-CM

## 2012-04-13 DIAGNOSIS — E0500 Thyrotoxicosis with diffuse goiter without thyrotoxic crisis or storm: Secondary | ICD-10-CM | POA: Diagnosis not present

## 2012-04-13 DIAGNOSIS — F411 Generalized anxiety disorder: Secondary | ICD-10-CM | POA: Diagnosis not present

## 2012-04-13 DIAGNOSIS — R4182 Altered mental status, unspecified: Secondary | ICD-10-CM | POA: Diagnosis not present

## 2012-04-13 DIAGNOSIS — R269 Unspecified abnormalities of gait and mobility: Secondary | ICD-10-CM | POA: Diagnosis not present

## 2012-04-13 DIAGNOSIS — E119 Type 2 diabetes mellitus without complications: Secondary | ICD-10-CM | POA: Diagnosis not present

## 2012-04-13 DIAGNOSIS — Z5189 Encounter for other specified aftercare: Secondary | ICD-10-CM | POA: Diagnosis not present

## 2012-04-13 DIAGNOSIS — F909 Attention-deficit hyperactivity disorder, unspecified type: Secondary | ICD-10-CM | POA: Diagnosis not present

## 2012-04-13 DIAGNOSIS — G35 Multiple sclerosis: Secondary | ICD-10-CM | POA: Diagnosis not present

## 2012-04-13 MED ORDER — RISPERIDONE 0.25 MG PO TABS: 0.2500 mg | ORAL_TABLET | Freq: Every day | ORAL | Status: DC

## 2012-04-13 MED ORDER — AMLODIPINE BESYLATE 10 MG PO TABS: 10.0000 mg | ORAL_TABLET | Freq: Every day | ORAL | Status: DC

## 2012-04-13 MED ORDER — CLONAZEPAM 1 MG PO TABS: 1.0000 mg | ORAL_TABLET | Freq: Two times a day (BID) | ORAL | Status: DC | PRN

## 2012-04-13 NOTE — Care Management Note (Signed)
    Page 1 of 1   04/13/2012     11:11:14 AM   CARE MANAGEMENT NOTE 04/13/2012  Patient:  Mark Kramer, Mark Kramer   Account Number:  [id number]  Date Initiated:  04/12/2012  Documentation initiated by:  Marvetta Gibbons  Subjective/Objective Assessment:   Pt admitted with multiple scierosis exacerbation     Action/Plan:   PTA pt lived at an ALF in [place] following for possible SNF placement   Anticipated DC Date:  04/13/2012   Anticipated DC Plan:  [place]  In-house referral  Clinical Social Worker      DC Planning Services  CM consult      Choice offered to / List presented to:             Status of service:  Completed, signed off Medicare Important Message given?   (If response is "NO", the following Medicare IM given date fields will be blank) Date Medicare IM given:   Date Additional Medicare IM given:    Discharge Disposition:  [place]  Per UR Regulation:  Reviewed for med. necessity/level of care/duration of stay  If discussed at [place] of Stay Meetings, dates discussed:    Comments:  04/13/12 11:09 Tomi Bamberger RN, BSN [phone] patient is for dc to snf today, CSW following, patient to go to [facility].

## 2012-04-13 NOTE — Progress Notes (Signed)
Clinical Social Worker (CSW) has provided pt mother with bed offers. Pt mother has accepted placement at [facility] for pt and plans to sign paperwork at the facility today. CSW also informed pt mother that pt has transferred to another unit and will now be followed by that unit CSW. Mother very appreciative.  This CSW signing off, unit CSW to begin following and assist with dc plans.  Hunt Oris, MSW, [place]

## 2012-04-13 NOTE — Progress Notes (Signed)
Clinical Social Work Department  CLINICAL SOCIAL WORK PLACEMENT NOTE  04/12/2012  Patient: Mark Kramer, Mark Kramer Account Number: [id number]  [place] date: 04/08/2012  Clinical Social Worker: Hunt Oris, Latanya Presser Date/time: 04/12/2012 02:47 PM  Clinical Social Work is seeking post-discharge placement for this patient at the following level of care: [place] (*CSW will update this form in Epic as items are completed)  04/12/2012 Patient/family provided with [place] Department of Clinical Social Work's list of facilities offering this level of care within the geographic area requested by the patient (or if unable, by the patient's family).  04/12/2012 Patient/family informed of their freedom to choose among providers that offer the needed level of care, that participate in Medicare, Medicaid or managed care program needed by the patient, have an available bed and are willing to accept the patient.  04/12/2012 Patient/family informed of [facility]' ownership interest in [facility], as well as of the fact that they are under no obligation to receive care at this facility.  PASARR submitted to EDS on 04/12/2012  PASARR number received from EDS on 04/12/2012  FL2 transmitted to all facilities in geographic area requested by pt/family on 04/12/2012  FL2 transmitted to all facilities within larger geographic area on  Patient informed that his/her managed care company has contracts with or will negotiate with certain facilities, including the following:  Patient/family informed of bed offers received: 04/12/12  Patient chooses bed at [facility]  Physician recommends and patient chooses bed at  Patient to be transferred to on  Patient to be transferred to facility by  The following physician request were entered in Epic:  Additional Comments:  Hunt Oris, MSW, [place]   Revision History...      Date/Time User Action    04/13/2012 8:36 AM Hunt Oris, LCSW Addend     04/12/2012 2:48 PM Hunt Oris, LCSW Sign   View Details Report    Eduard Clos, MSW, [place]

## 2012-04-13 NOTE — Progress Notes (Signed)
Patient ready for SNF transfer today to [facility]- patient and his mother are agreeable to this plan- plan transfer via [place], MSW, [place]

## 2012-04-13 NOTE — Discharge Summary (Signed)
Physician Discharge Summary  Mark Kramer [id number] DOB: 1960/12/19 DOA: 04/08/2012  PCP: Default, Provider, MD  Admit date: 04/08/2012 Discharge date: 04/13/2012  Time spent: *50 minutes  Recommendations for Outpatient Follow-up:  1. Follow up Neurology as outpatient  Discharge Diagnoses:  Principal Problem:   Multiple sclerosis Active Problems:   Graves disease   Dyslipidemia   Altered mental status   UTI (urinary tract infection)   Discharge Condition: Stable  Diet recommendation: Low fat diet  Filed Weights   04/09/12 0343  Weight: 85.3 kg (188 lb 0.8 oz)    History of present illness:  Patient with h/o multiple sclerosis, who was brought in for some confusion and weakness. In the ear the patient has become progressively weaker and more monosyllabic. During my interview patient is alert and oriented but very sluggish in response and scant in his answers. He reports no fevers, chills, nausea, vomiting, burning on urination, cough. He denies any pain or any evidence of infection. He says this doesn't feel like his usual MS. He denies any respiratory issues/difficulty.   Hospital Course:   Altered mental status Resolved, back to baseline Patient was admitted with altered mental status, which has improved. All the cultures are negative, urine culture grew morphophytes. Psychiatry was consulted, and the medications were adjusted, stopped Seroquel. He has been started on Risperdal 0.25 mg po daily.Stable , back to baseline. EEG and MRI Brain were negative  Multiple sclerosis Will continue  The home medications, will follow up with neurology as outpatient  Hypertension Continue with nadolol 80 mg po BID Will also start him on amlodipine 10 mg po daily  Procedures:  EEG: negative  Consultations:  Neurology  psychiatry  Discharge Exam: Filed Vitals:   04/12/12 1233 04/12/12 1700 04/12/12 2231 04/13/12 0559  BP: 147/82 147/86 159/89 165/94  Pulse:  70 64 68   Temp: 97.8 F (36.6 C) 98.1 F (36.7 C) 98.2 F (36.8 C) 98.1 F (36.7 C)  TempSrc: Oral Oral Oral Oral  Resp:  20 18 13   Height:      Weight:      SpO2:  99% 96% 99%    General: appear in no acute distress Cardiovascular: s1s2 RRR Respiratory: clear bilaterally Ext: No edema  Discharge Instructions  Discharge Orders   Future Orders Complete By Expires     Diet - low sodium heart healthy  As directed     Increase activity slowly  As directed         Medication List    STOP taking these medications       QUEtiapine 50 MG tablet  Commonly known as:  SEROQUEL      TAKE these medications       amphetamine-dextroamphetamine 20 MG tablet  Commonly known as:  ADDERALL  Take 20 mg by mouth daily.     cholecalciferol 1000 UNITS tablet  Commonly known as:  VITAMIN D  Take 2,000 Units by mouth daily.     clonazePAM 1 MG tablet  Commonly known as:  KLONOPIN  Take 1 tablet (1 mg total) by mouth 2 (two) times daily as needed for anxiety (1.5 tabs in the am, 2 tabs in the pm).     desmopressin 0.1 MG tablet  Commonly known as:  DDAVP  Take 0.1 mg by mouth at bedtime.     escitalopram 20 MG tablet  Commonly known as:  LEXAPRO  Take 20 mg by mouth daily.     GILENYA 0.5 MG Caps  Generic  drug:  Fingolimod HCl  Take 1 capsule by mouth daily. For Multiple Sclerosis     imipramine 25 MG tablet  Commonly known as:  TOFRANIL  Take 50 mg by mouth at bedtime.     levothyroxine 112 MCG tablet  Commonly known as:  SYNTHROID, LEVOTHROID  Take 112 mcg by mouth daily.     nadolol 80 MG tablet  Commonly known as:  CORGARD  Take 80 mg by mouth 2 (two) times daily.     NUEDEXTA 20-10 MG Caps  Generic drug:  Dextromethorphan-Quinidine  Take 1 capsule by mouth 2 (two) times daily.     pravastatin 20 MG tablet  Commonly known as:  PRAVACHOL  Take 20 mg by mouth daily.     risperiDONE 0.25 MG tablet  Commonly known as:  RISPERDAL  Take 1 tablet (0.25 mg total) by mouth  at bedtime.       Amlodipine 10 mg po daily   The results of significant diagnostics from this hospitalization (including imaging, microbiology, ancillary and laboratory) are listed below for reference.    Significant Diagnostic Studies: Dg Chest 2 View  04/08/2012  *RADIOLOGY REPORT*  Clinical Data: Altered mental status.  CHEST - 2 VIEW  Comparison: 08/30/2008.   IMPRESSION: Low volume chest with basilar atelectasis.  Probable mild cardiomegaly.   Original Report Authenticated By: Dereck Ligas, M.D.    Ct Head Wo Contrast  04/08/2012  *RADIOLOGY REPORT*  Clinical Data: Altered mental status.  CT HEAD WITHOUT CONTRAST  IMPRESSION:  1.  Age advanced cerebral atrophy, ventriculomegaly and periventricular white matter disease. 2.  No acute intracranial findings or mass lesions.   Original Report Authenticated By: Marijo Sanes, M.D.    Mr [name] Wo Contrast  04/09/2012  *RADIOLOGY REPORT*  Clinical Data: Multiple sclerosis with altered mental status  MRI HEAD WITHOUT AND WITH CONTRAST  Technique:  Multiplanar, multiecho pulse sequences of the brain and surrounding structures were obtained according to standard protocol without and with intravenous contrast  Contrast: 38mL MULTIHANCE GADOBENATE DIMEGLUMINE 529 MG/ML IV SOLN  Comparison: CT 04/08/2012  Findings: Moderate to advanced atrophy for age with enlargement of the ventricles and subarachnoid space diffusely.  Diffuse periventricular white matter hyperintensity compatible with multiple sclerosis.  There is atrophy of the corpus callosum which shows increased signal at the callosal septal interface, typical for multiple sclerosis.  Mild hyperintensity in the medulla may be due to ischemia or multiple sclerosis.  Hyperintensity in the right lower pons appears to represent chronic ischemia.  Diffusion weighted imaging is negative.  No enhancing lesions are seen following contrast administration.  No intracranial hemorrhage or mass is identified.   IMPRESSION: Moderate to advanced atrophy.  Periventricular white matter abnormality diffusely compatible with chronic multiple sclerosis there is also some mild disease in the medulla which may be due to multiple sclerosis.  No active plaques are identified.   Original Report Authenticated By: Carl Best, M.D.     Microbiology: Recent Results (from the past 240 hour(s))  MRSA PCR SCREENING     Status: None   Collection Time    04/09/12  3:47 AM      Result Value Range Status   MRSA by PCR NEGATIVE  NEGATIVE Final   Comment:            The GeneXpert MRSA Assay (FDA     approved for NASAL specimens     only), is one component of a     comprehensive MRSA colonization  surveillance program. It is not     intended to diagnose MRSA     infection nor to guide or     monitor treatment for     MRSA infections.  URINE CULTURE     Status: None   Collection Time    04/09/12 12:32 PM      Result Value Range Status   Specimen Description URINE, CATHETERIZED   Final   Special Requests NONE   Final   Culture  Setup Time 04/09/2012 13:49   Final   Colony Count >=100,000 COLONIES/ML   Final   Culture     Final   Value: Multiple bacterial morphotypes present, none predominant. Suggest appropriate recollection if clinically indicated.   Report Status 04/10/2012 FINAL   Final     Labs: Basic Metabolic Panel:  Recent Labs Lab 04/08/12 2345 04/09/12 0745  NA 139 138  K 4.6 3.6  CL 99 101  CO2 29 25  GLUCOSE 80 75  BUN 26* 26*  CREATININE 0.97 0.90  CALCIUM 9.9 9.1   Liver Function Tests:  Recent Labs Lab 04/08/12 2345  AST 19  ALT 12  ALKPHOS 88  BILITOT 0.7  PROT 7.5  ALBUMIN 3.7   No results found for this basename: LIPASE, AMYLASE,  in the last 168 hours No results found for this basename: AMMONIA,  in the last 168 hours CBC:  Recent Labs Lab 04/08/12 2345 04/09/12 0745  WBC 4.0 3.1*  NEUTROABS 2.8  --   HGB 16.6 16.2  HCT 48.8 47.9  MCV 84.7 86.5  PLT 180  174   Cardiac Enzymes:  Recent Labs Lab 04/08/12 2345  TROPONINI <0.30   BNP: BNP (last 3 results) No results found for this basename: PROBNP,  in the last 8760 hours CBG: No results found for this basename: GLUCAP,  in the last 168 hours     Signed:  Ardell Aaronson S  [facility] 04/13/2012, 11:22 AM

## 2012-06-03 DIAGNOSIS — R5381 Other malaise: Secondary | ICD-10-CM | POA: Diagnosis not present

## 2012-06-03 DIAGNOSIS — G35 Multiple sclerosis: Secondary | ICD-10-CM | POA: Diagnosis not present

## 2012-06-03 DIAGNOSIS — G252 Other specified forms of tremor: Secondary | ICD-10-CM | POA: Diagnosis not present

## 2012-06-03 DIAGNOSIS — G250 Essential tremor: Secondary | ICD-10-CM | POA: Diagnosis not present

## 2012-06-03 DIAGNOSIS — R5383 Other fatigue: Secondary | ICD-10-CM | POA: Diagnosis not present

## 2012-06-03 DIAGNOSIS — N3941 Urge incontinence: Secondary | ICD-10-CM | POA: Diagnosis not present

## 2012-06-03 DIAGNOSIS — R4586 Emotional lability: Secondary | ICD-10-CM | POA: Diagnosis not present

## 2012-07-07 DIAGNOSIS — E039 Hypothyroidism, unspecified: Secondary | ICD-10-CM | POA: Diagnosis not present

## 2012-07-07 DIAGNOSIS — G35 Multiple sclerosis: Secondary | ICD-10-CM | POA: Diagnosis not present

## 2012-07-07 DIAGNOSIS — I10 Essential (primary) hypertension: Secondary | ICD-10-CM | POA: Diagnosis not present

## 2012-08-19 DIAGNOSIS — Z79899 Other long term (current) drug therapy: Secondary | ICD-10-CM | POA: Diagnosis not present

## 2012-08-19 DIAGNOSIS — Z5181 Encounter for therapeutic drug level monitoring: Secondary | ICD-10-CM | POA: Diagnosis not present

## 2012-08-30 DIAGNOSIS — K739 Chronic hepatitis, unspecified: Secondary | ICD-10-CM | POA: Diagnosis not present

## 2012-08-30 DIAGNOSIS — G35 Multiple sclerosis: Secondary | ICD-10-CM | POA: Diagnosis not present

## 2012-08-30 DIAGNOSIS — I10 Essential (primary) hypertension: Secondary | ICD-10-CM | POA: Diagnosis not present

## 2012-10-20 DIAGNOSIS — Z23 Encounter for immunization: Secondary | ICD-10-CM | POA: Diagnosis not present

## 2012-10-22 DIAGNOSIS — M6281 Muscle weakness (generalized): Secondary | ICD-10-CM | POA: Diagnosis not present

## 2012-10-22 DIAGNOSIS — R279 Unspecified lack of coordination: Secondary | ICD-10-CM | POA: Diagnosis not present

## 2012-10-25 DIAGNOSIS — R279 Unspecified lack of coordination: Secondary | ICD-10-CM | POA: Diagnosis not present

## 2012-10-25 DIAGNOSIS — M6281 Muscle weakness (generalized): Secondary | ICD-10-CM | POA: Diagnosis not present

## 2012-10-26 DIAGNOSIS — M6281 Muscle weakness (generalized): Secondary | ICD-10-CM | POA: Diagnosis not present

## 2012-10-26 DIAGNOSIS — R279 Unspecified lack of coordination: Secondary | ICD-10-CM | POA: Diagnosis not present

## 2012-10-27 DIAGNOSIS — M6281 Muscle weakness (generalized): Secondary | ICD-10-CM | POA: Diagnosis not present

## 2012-10-27 DIAGNOSIS — R279 Unspecified lack of coordination: Secondary | ICD-10-CM | POA: Diagnosis not present

## 2012-10-27 DIAGNOSIS — K739 Chronic hepatitis, unspecified: Secondary | ICD-10-CM | POA: Diagnosis not present

## 2012-10-27 DIAGNOSIS — G35 Multiple sclerosis: Secondary | ICD-10-CM | POA: Diagnosis not present

## 2012-10-27 DIAGNOSIS — I10 Essential (primary) hypertension: Secondary | ICD-10-CM | POA: Diagnosis not present

## 2012-10-28 DIAGNOSIS — M6281 Muscle weakness (generalized): Secondary | ICD-10-CM | POA: Diagnosis not present

## 2012-10-28 DIAGNOSIS — R279 Unspecified lack of coordination: Secondary | ICD-10-CM | POA: Diagnosis not present

## 2012-10-29 DIAGNOSIS — M6281 Muscle weakness (generalized): Secondary | ICD-10-CM | POA: Diagnosis not present

## 2012-10-29 DIAGNOSIS — R279 Unspecified lack of coordination: Secondary | ICD-10-CM | POA: Diagnosis not present

## 2012-11-01 DIAGNOSIS — R279 Unspecified lack of coordination: Secondary | ICD-10-CM | POA: Diagnosis not present

## 2012-11-01 DIAGNOSIS — M6281 Muscle weakness (generalized): Secondary | ICD-10-CM | POA: Diagnosis not present

## 2012-11-02 DIAGNOSIS — R279 Unspecified lack of coordination: Secondary | ICD-10-CM | POA: Diagnosis not present

## 2012-11-02 DIAGNOSIS — M6281 Muscle weakness (generalized): Secondary | ICD-10-CM | POA: Diagnosis not present

## 2012-11-03 DIAGNOSIS — R279 Unspecified lack of coordination: Secondary | ICD-10-CM | POA: Diagnosis not present

## 2012-11-03 DIAGNOSIS — M6281 Muscle weakness (generalized): Secondary | ICD-10-CM | POA: Diagnosis not present

## 2012-11-04 DIAGNOSIS — R279 Unspecified lack of coordination: Secondary | ICD-10-CM | POA: Diagnosis not present

## 2012-11-04 DIAGNOSIS — M6281 Muscle weakness (generalized): Secondary | ICD-10-CM | POA: Diagnosis not present

## 2012-11-05 DIAGNOSIS — M6281 Muscle weakness (generalized): Secondary | ICD-10-CM | POA: Diagnosis not present

## 2012-11-05 DIAGNOSIS — R279 Unspecified lack of coordination: Secondary | ICD-10-CM | POA: Diagnosis not present

## 2012-11-09 DIAGNOSIS — R279 Unspecified lack of coordination: Secondary | ICD-10-CM | POA: Diagnosis not present

## 2012-11-09 DIAGNOSIS — M6281 Muscle weakness (generalized): Secondary | ICD-10-CM | POA: Diagnosis not present

## 2012-11-10 DIAGNOSIS — M6281 Muscle weakness (generalized): Secondary | ICD-10-CM | POA: Diagnosis not present

## 2012-11-10 DIAGNOSIS — R279 Unspecified lack of coordination: Secondary | ICD-10-CM | POA: Diagnosis not present

## 2012-11-11 DIAGNOSIS — R279 Unspecified lack of coordination: Secondary | ICD-10-CM | POA: Diagnosis not present

## 2012-11-11 DIAGNOSIS — M6281 Muscle weakness (generalized): Secondary | ICD-10-CM | POA: Diagnosis not present

## 2012-11-12 DIAGNOSIS — M6281 Muscle weakness (generalized): Secondary | ICD-10-CM | POA: Diagnosis not present

## 2012-11-12 DIAGNOSIS — R279 Unspecified lack of coordination: Secondary | ICD-10-CM | POA: Diagnosis not present

## 2012-11-13 DIAGNOSIS — R279 Unspecified lack of coordination: Secondary | ICD-10-CM | POA: Diagnosis not present

## 2012-11-13 DIAGNOSIS — M6281 Muscle weakness (generalized): Secondary | ICD-10-CM | POA: Diagnosis not present

## 2012-11-14 DIAGNOSIS — M6281 Muscle weakness (generalized): Secondary | ICD-10-CM | POA: Diagnosis not present

## 2012-11-14 DIAGNOSIS — R279 Unspecified lack of coordination: Secondary | ICD-10-CM | POA: Diagnosis not present

## 2012-11-16 DIAGNOSIS — Z5181 Encounter for therapeutic drug level monitoring: Secondary | ICD-10-CM | POA: Diagnosis not present

## 2012-11-16 DIAGNOSIS — M6281 Muscle weakness (generalized): Secondary | ICD-10-CM | POA: Diagnosis not present

## 2012-11-16 DIAGNOSIS — Z79899 Other long term (current) drug therapy: Secondary | ICD-10-CM | POA: Diagnosis not present

## 2012-11-16 DIAGNOSIS — R279 Unspecified lack of coordination: Secondary | ICD-10-CM | POA: Diagnosis not present

## 2012-11-17 DIAGNOSIS — R279 Unspecified lack of coordination: Secondary | ICD-10-CM | POA: Diagnosis not present

## 2012-11-17 DIAGNOSIS — M6281 Muscle weakness (generalized): Secondary | ICD-10-CM | POA: Diagnosis not present

## 2012-11-18 DIAGNOSIS — R279 Unspecified lack of coordination: Secondary | ICD-10-CM | POA: Diagnosis not present

## 2012-11-18 DIAGNOSIS — M6281 Muscle weakness (generalized): Secondary | ICD-10-CM | POA: Diagnosis not present

## 2012-11-19 DIAGNOSIS — M6281 Muscle weakness (generalized): Secondary | ICD-10-CM | POA: Diagnosis not present

## 2012-11-19 DIAGNOSIS — R279 Unspecified lack of coordination: Secondary | ICD-10-CM | POA: Diagnosis not present

## 2012-11-22 DIAGNOSIS — R279 Unspecified lack of coordination: Secondary | ICD-10-CM | POA: Diagnosis not present

## 2012-11-22 DIAGNOSIS — M6281 Muscle weakness (generalized): Secondary | ICD-10-CM | POA: Diagnosis not present

## 2012-11-23 DIAGNOSIS — M6281 Muscle weakness (generalized): Secondary | ICD-10-CM | POA: Diagnosis not present

## 2012-11-23 DIAGNOSIS — R279 Unspecified lack of coordination: Secondary | ICD-10-CM | POA: Diagnosis not present

## 2012-11-24 DIAGNOSIS — R279 Unspecified lack of coordination: Secondary | ICD-10-CM | POA: Diagnosis not present

## 2012-11-24 DIAGNOSIS — M6281 Muscle weakness (generalized): Secondary | ICD-10-CM | POA: Diagnosis not present

## 2012-11-25 DIAGNOSIS — R279 Unspecified lack of coordination: Secondary | ICD-10-CM | POA: Diagnosis not present

## 2012-11-25 DIAGNOSIS — M6281 Muscle weakness (generalized): Secondary | ICD-10-CM | POA: Diagnosis not present

## 2012-11-29 DIAGNOSIS — M6281 Muscle weakness (generalized): Secondary | ICD-10-CM | POA: Diagnosis not present

## 2012-11-29 DIAGNOSIS — R279 Unspecified lack of coordination: Secondary | ICD-10-CM | POA: Diagnosis not present

## 2012-11-30 DIAGNOSIS — M6281 Muscle weakness (generalized): Secondary | ICD-10-CM | POA: Diagnosis not present

## 2012-11-30 DIAGNOSIS — R279 Unspecified lack of coordination: Secondary | ICD-10-CM | POA: Diagnosis not present

## 2012-12-01 DIAGNOSIS — M6281 Muscle weakness (generalized): Secondary | ICD-10-CM | POA: Diagnosis not present

## 2012-12-01 DIAGNOSIS — R279 Unspecified lack of coordination: Secondary | ICD-10-CM | POA: Diagnosis not present

## 2012-12-02 DIAGNOSIS — N3941 Urge incontinence: Secondary | ICD-10-CM | POA: Diagnosis not present

## 2012-12-02 DIAGNOSIS — R279 Unspecified lack of coordination: Secondary | ICD-10-CM | POA: Diagnosis not present

## 2012-12-02 DIAGNOSIS — M6281 Muscle weakness (generalized): Secondary | ICD-10-CM | POA: Diagnosis not present

## 2012-12-02 DIAGNOSIS — R4586 Emotional lability: Secondary | ICD-10-CM | POA: Diagnosis not present

## 2012-12-02 DIAGNOSIS — G35 Multiple sclerosis: Secondary | ICD-10-CM | POA: Diagnosis not present

## 2012-12-02 DIAGNOSIS — G250 Essential tremor: Secondary | ICD-10-CM | POA: Diagnosis not present

## 2012-12-03 DIAGNOSIS — R279 Unspecified lack of coordination: Secondary | ICD-10-CM | POA: Diagnosis not present

## 2012-12-03 DIAGNOSIS — M6281 Muscle weakness (generalized): Secondary | ICD-10-CM | POA: Diagnosis not present

## 2012-12-05 DIAGNOSIS — R279 Unspecified lack of coordination: Secondary | ICD-10-CM | POA: Diagnosis not present

## 2012-12-05 DIAGNOSIS — M6281 Muscle weakness (generalized): Secondary | ICD-10-CM | POA: Diagnosis not present

## 2012-12-07 DIAGNOSIS — R279 Unspecified lack of coordination: Secondary | ICD-10-CM | POA: Diagnosis not present

## 2012-12-07 DIAGNOSIS — M6281 Muscle weakness (generalized): Secondary | ICD-10-CM | POA: Diagnosis not present

## 2012-12-08 DIAGNOSIS — R279 Unspecified lack of coordination: Secondary | ICD-10-CM | POA: Diagnosis not present

## 2012-12-08 DIAGNOSIS — M6281 Muscle weakness (generalized): Secondary | ICD-10-CM | POA: Diagnosis not present

## 2012-12-10 DIAGNOSIS — M6281 Muscle weakness (generalized): Secondary | ICD-10-CM | POA: Diagnosis not present

## 2012-12-10 DIAGNOSIS — R279 Unspecified lack of coordination: Secondary | ICD-10-CM | POA: Diagnosis not present

## 2012-12-13 DIAGNOSIS — R279 Unspecified lack of coordination: Secondary | ICD-10-CM | POA: Diagnosis not present

## 2012-12-13 DIAGNOSIS — M6281 Muscle weakness (generalized): Secondary | ICD-10-CM | POA: Diagnosis not present

## 2012-12-14 DIAGNOSIS — M6281 Muscle weakness (generalized): Secondary | ICD-10-CM | POA: Diagnosis not present

## 2012-12-14 DIAGNOSIS — R279 Unspecified lack of coordination: Secondary | ICD-10-CM | POA: Diagnosis not present

## 2012-12-15 DIAGNOSIS — R279 Unspecified lack of coordination: Secondary | ICD-10-CM | POA: Diagnosis not present

## 2012-12-15 DIAGNOSIS — M6281 Muscle weakness (generalized): Secondary | ICD-10-CM | POA: Diagnosis not present

## 2012-12-16 DIAGNOSIS — R279 Unspecified lack of coordination: Secondary | ICD-10-CM | POA: Diagnosis not present

## 2012-12-16 DIAGNOSIS — M6281 Muscle weakness (generalized): Secondary | ICD-10-CM | POA: Diagnosis not present

## 2012-12-17 DIAGNOSIS — R279 Unspecified lack of coordination: Secondary | ICD-10-CM | POA: Diagnosis not present

## 2012-12-17 DIAGNOSIS — M6281 Muscle weakness (generalized): Secondary | ICD-10-CM | POA: Diagnosis not present

## 2012-12-17 DIAGNOSIS — H9120 Sudden idiopathic hearing loss, unspecified ear: Secondary | ICD-10-CM | POA: Diagnosis not present

## 2012-12-19 DIAGNOSIS — I10 Essential (primary) hypertension: Secondary | ICD-10-CM | POA: Diagnosis not present

## 2012-12-19 DIAGNOSIS — G35 Multiple sclerosis: Secondary | ICD-10-CM | POA: Diagnosis not present

## 2012-12-19 DIAGNOSIS — K469 Unspecified abdominal hernia without obstruction or gangrene: Secondary | ICD-10-CM | POA: Diagnosis not present

## 2012-12-21 DIAGNOSIS — R279 Unspecified lack of coordination: Secondary | ICD-10-CM | POA: Diagnosis not present

## 2012-12-21 DIAGNOSIS — M6281 Muscle weakness (generalized): Secondary | ICD-10-CM | POA: Diagnosis not present

## 2012-12-22 DIAGNOSIS — R279 Unspecified lack of coordination: Secondary | ICD-10-CM | POA: Diagnosis not present

## 2012-12-22 DIAGNOSIS — M6281 Muscle weakness (generalized): Secondary | ICD-10-CM | POA: Diagnosis not present

## 2012-12-24 DIAGNOSIS — M6281 Muscle weakness (generalized): Secondary | ICD-10-CM | POA: Diagnosis not present

## 2012-12-24 DIAGNOSIS — R279 Unspecified lack of coordination: Secondary | ICD-10-CM | POA: Diagnosis not present

## 2012-12-27 DIAGNOSIS — R279 Unspecified lack of coordination: Secondary | ICD-10-CM | POA: Diagnosis not present

## 2012-12-27 DIAGNOSIS — M6281 Muscle weakness (generalized): Secondary | ICD-10-CM | POA: Diagnosis not present

## 2012-12-29 DIAGNOSIS — Z79899 Other long term (current) drug therapy: Secondary | ICD-10-CM | POA: Diagnosis not present

## 2012-12-29 DIAGNOSIS — M6281 Muscle weakness (generalized): Secondary | ICD-10-CM | POA: Diagnosis not present

## 2012-12-29 DIAGNOSIS — R279 Unspecified lack of coordination: Secondary | ICD-10-CM | POA: Diagnosis not present

## 2012-12-29 DIAGNOSIS — Z5181 Encounter for therapeutic drug level monitoring: Secondary | ICD-10-CM | POA: Diagnosis not present

## 2012-12-30 DIAGNOSIS — H4720 Unspecified optic atrophy: Secondary | ICD-10-CM | POA: Diagnosis not present

## 2012-12-30 DIAGNOSIS — M6281 Muscle weakness (generalized): Secondary | ICD-10-CM | POA: Diagnosis not present

## 2012-12-30 DIAGNOSIS — R279 Unspecified lack of coordination: Secondary | ICD-10-CM | POA: Diagnosis not present

## 2012-12-30 DIAGNOSIS — G35 Multiple sclerosis: Secondary | ICD-10-CM | POA: Diagnosis not present

## 2012-12-30 DIAGNOSIS — H35379 Puckering of macula, unspecified eye: Secondary | ICD-10-CM | POA: Diagnosis not present

## 2012-12-31 DIAGNOSIS — R279 Unspecified lack of coordination: Secondary | ICD-10-CM | POA: Diagnosis not present

## 2012-12-31 DIAGNOSIS — M6281 Muscle weakness (generalized): Secondary | ICD-10-CM | POA: Diagnosis not present

## 2013-01-01 DIAGNOSIS — R279 Unspecified lack of coordination: Secondary | ICD-10-CM | POA: Diagnosis not present

## 2013-01-01 DIAGNOSIS — M6281 Muscle weakness (generalized): Secondary | ICD-10-CM | POA: Diagnosis not present

## 2013-01-02 DIAGNOSIS — M6281 Muscle weakness (generalized): Secondary | ICD-10-CM | POA: Diagnosis not present

## 2013-01-02 DIAGNOSIS — R279 Unspecified lack of coordination: Secondary | ICD-10-CM | POA: Diagnosis not present

## 2013-01-03 DIAGNOSIS — M6281 Muscle weakness (generalized): Secondary | ICD-10-CM | POA: Diagnosis not present

## 2013-01-03 DIAGNOSIS — R279 Unspecified lack of coordination: Secondary | ICD-10-CM | POA: Diagnosis not present

## 2013-01-04 DIAGNOSIS — M6281 Muscle weakness (generalized): Secondary | ICD-10-CM | POA: Diagnosis not present

## 2013-01-04 DIAGNOSIS — R279 Unspecified lack of coordination: Secondary | ICD-10-CM | POA: Diagnosis not present

## 2013-01-05 DIAGNOSIS — R279 Unspecified lack of coordination: Secondary | ICD-10-CM | POA: Diagnosis not present

## 2013-01-05 DIAGNOSIS — Z5181 Encounter for therapeutic drug level monitoring: Secondary | ICD-10-CM | POA: Diagnosis not present

## 2013-01-05 DIAGNOSIS — M6281 Muscle weakness (generalized): Secondary | ICD-10-CM | POA: Diagnosis not present

## 2013-01-05 DIAGNOSIS — Z79899 Other long term (current) drug therapy: Secondary | ICD-10-CM | POA: Diagnosis not present

## 2013-01-10 DIAGNOSIS — M6281 Muscle weakness (generalized): Secondary | ICD-10-CM | POA: Diagnosis not present

## 2013-01-10 DIAGNOSIS — R279 Unspecified lack of coordination: Secondary | ICD-10-CM | POA: Diagnosis not present

## 2013-01-11 DIAGNOSIS — R279 Unspecified lack of coordination: Secondary | ICD-10-CM | POA: Diagnosis not present

## 2013-01-11 DIAGNOSIS — M6281 Muscle weakness (generalized): Secondary | ICD-10-CM | POA: Diagnosis not present

## 2013-01-12 DIAGNOSIS — M6281 Muscle weakness (generalized): Secondary | ICD-10-CM | POA: Diagnosis not present

## 2013-01-12 DIAGNOSIS — R279 Unspecified lack of coordination: Secondary | ICD-10-CM | POA: Diagnosis not present

## 2013-01-13 DIAGNOSIS — M6281 Muscle weakness (generalized): Secondary | ICD-10-CM | POA: Diagnosis not present

## 2013-01-13 DIAGNOSIS — R279 Unspecified lack of coordination: Secondary | ICD-10-CM | POA: Diagnosis not present

## 2013-01-14 DIAGNOSIS — M6281 Muscle weakness (generalized): Secondary | ICD-10-CM | POA: Diagnosis not present

## 2013-01-14 DIAGNOSIS — R279 Unspecified lack of coordination: Secondary | ICD-10-CM | POA: Diagnosis not present

## 2013-01-17 DIAGNOSIS — M6281 Muscle weakness (generalized): Secondary | ICD-10-CM | POA: Diagnosis not present

## 2013-01-17 DIAGNOSIS — R279 Unspecified lack of coordination: Secondary | ICD-10-CM | POA: Diagnosis not present

## 2013-01-18 DIAGNOSIS — R279 Unspecified lack of coordination: Secondary | ICD-10-CM | POA: Diagnosis not present

## 2013-01-18 DIAGNOSIS — M6281 Muscle weakness (generalized): Secondary | ICD-10-CM | POA: Diagnosis not present

## 2013-01-19 DIAGNOSIS — R279 Unspecified lack of coordination: Secondary | ICD-10-CM | POA: Diagnosis not present

## 2013-01-19 DIAGNOSIS — M6281 Muscle weakness (generalized): Secondary | ICD-10-CM | POA: Diagnosis not present

## 2013-01-20 DIAGNOSIS — R279 Unspecified lack of coordination: Secondary | ICD-10-CM | POA: Diagnosis not present

## 2013-01-20 DIAGNOSIS — M6281 Muscle weakness (generalized): Secondary | ICD-10-CM | POA: Diagnosis not present

## 2013-01-21 DIAGNOSIS — M6281 Muscle weakness (generalized): Secondary | ICD-10-CM | POA: Diagnosis not present

## 2013-01-21 DIAGNOSIS — R279 Unspecified lack of coordination: Secondary | ICD-10-CM | POA: Diagnosis not present

## 2013-01-24 DIAGNOSIS — R279 Unspecified lack of coordination: Secondary | ICD-10-CM | POA: Diagnosis not present

## 2013-01-24 DIAGNOSIS — M6281 Muscle weakness (generalized): Secondary | ICD-10-CM | POA: Diagnosis not present

## 2013-01-26 DIAGNOSIS — R279 Unspecified lack of coordination: Secondary | ICD-10-CM | POA: Diagnosis not present

## 2013-01-26 DIAGNOSIS — M6281 Muscle weakness (generalized): Secondary | ICD-10-CM | POA: Diagnosis not present

## 2013-01-28 DIAGNOSIS — M6281 Muscle weakness (generalized): Secondary | ICD-10-CM | POA: Diagnosis not present

## 2013-01-28 DIAGNOSIS — R279 Unspecified lack of coordination: Secondary | ICD-10-CM | POA: Diagnosis not present

## 2013-01-31 DIAGNOSIS — R279 Unspecified lack of coordination: Secondary | ICD-10-CM | POA: Diagnosis not present

## 2013-01-31 DIAGNOSIS — M6281 Muscle weakness (generalized): Secondary | ICD-10-CM | POA: Diagnosis not present

## 2013-02-02 DIAGNOSIS — R279 Unspecified lack of coordination: Secondary | ICD-10-CM | POA: Diagnosis not present

## 2013-02-02 DIAGNOSIS — M6281 Muscle weakness (generalized): Secondary | ICD-10-CM | POA: Diagnosis not present

## 2013-02-04 DIAGNOSIS — M6281 Muscle weakness (generalized): Secondary | ICD-10-CM | POA: Diagnosis not present

## 2013-02-04 DIAGNOSIS — R279 Unspecified lack of coordination: Secondary | ICD-10-CM | POA: Diagnosis not present

## 2013-02-05 DIAGNOSIS — K469 Unspecified abdominal hernia without obstruction or gangrene: Secondary | ICD-10-CM | POA: Diagnosis not present

## 2013-02-05 DIAGNOSIS — I10 Essential (primary) hypertension: Secondary | ICD-10-CM | POA: Diagnosis not present

## 2013-02-05 DIAGNOSIS — G35 Multiple sclerosis: Secondary | ICD-10-CM | POA: Diagnosis not present

## 2013-02-15 DIAGNOSIS — Z79899 Other long term (current) drug therapy: Secondary | ICD-10-CM | POA: Diagnosis not present

## 2013-02-15 DIAGNOSIS — Z5181 Encounter for therapeutic drug level monitoring: Secondary | ICD-10-CM | POA: Diagnosis not present

## 2013-03-23 DIAGNOSIS — K469 Unspecified abdominal hernia without obstruction or gangrene: Secondary | ICD-10-CM | POA: Diagnosis not present

## 2013-03-23 DIAGNOSIS — E079 Disorder of thyroid, unspecified: Secondary | ICD-10-CM | POA: Diagnosis not present

## 2013-03-23 DIAGNOSIS — G35 Multiple sclerosis: Secondary | ICD-10-CM | POA: Diagnosis not present

## 2013-05-08 DIAGNOSIS — K469 Unspecified abdominal hernia without obstruction or gangrene: Secondary | ICD-10-CM | POA: Diagnosis not present

## 2013-05-08 DIAGNOSIS — E039 Hypothyroidism, unspecified: Secondary | ICD-10-CM | POA: Diagnosis not present

## 2013-05-08 DIAGNOSIS — G35 Multiple sclerosis: Secondary | ICD-10-CM | POA: Diagnosis not present

## 2013-05-17 DIAGNOSIS — Z5181 Encounter for therapeutic drug level monitoring: Secondary | ICD-10-CM | POA: Diagnosis not present

## 2013-05-17 DIAGNOSIS — Z79899 Other long term (current) drug therapy: Secondary | ICD-10-CM | POA: Diagnosis not present

## 2013-06-19 DIAGNOSIS — I10 Essential (primary) hypertension: Secondary | ICD-10-CM | POA: Diagnosis not present

## 2013-06-19 DIAGNOSIS — F329 Major depressive disorder, single episode, unspecified: Secondary | ICD-10-CM | POA: Diagnosis not present

## 2013-06-19 DIAGNOSIS — F3289 Other specified depressive episodes: Secondary | ICD-10-CM | POA: Diagnosis not present

## 2013-06-19 DIAGNOSIS — E039 Hypothyroidism, unspecified: Secondary | ICD-10-CM | POA: Diagnosis not present

## 2013-07-27 DIAGNOSIS — F329 Major depressive disorder, single episode, unspecified: Secondary | ICD-10-CM | POA: Diagnosis not present

## 2013-07-27 DIAGNOSIS — F3289 Other specified depressive episodes: Secondary | ICD-10-CM | POA: Diagnosis not present

## 2013-07-27 DIAGNOSIS — E039 Hypothyroidism, unspecified: Secondary | ICD-10-CM | POA: Diagnosis not present

## 2013-07-27 DIAGNOSIS — I10 Essential (primary) hypertension: Secondary | ICD-10-CM | POA: Diagnosis not present

## 2013-07-27 DIAGNOSIS — E785 Hyperlipidemia, unspecified: Secondary | ICD-10-CM | POA: Diagnosis not present

## 2013-08-17 DIAGNOSIS — Z79899 Other long term (current) drug therapy: Secondary | ICD-10-CM | POA: Diagnosis not present

## 2013-08-17 DIAGNOSIS — Z5181 Encounter for therapeutic drug level monitoring: Secondary | ICD-10-CM | POA: Diagnosis not present

## 2013-08-24 DIAGNOSIS — R4586 Emotional lability: Secondary | ICD-10-CM | POA: Diagnosis not present

## 2013-08-24 DIAGNOSIS — G250 Essential tremor: Secondary | ICD-10-CM | POA: Diagnosis not present

## 2013-08-24 DIAGNOSIS — G35 Multiple sclerosis: Secondary | ICD-10-CM | POA: Diagnosis not present

## 2013-08-24 DIAGNOSIS — G35D Multiple sclerosis, unspecified: Secondary | ICD-10-CM | POA: Diagnosis not present

## 2013-08-24 DIAGNOSIS — E559 Vitamin D deficiency, unspecified: Secondary | ICD-10-CM | POA: Diagnosis not present

## 2013-08-24 DIAGNOSIS — G252 Other specified forms of tremor: Secondary | ICD-10-CM | POA: Diagnosis not present

## 2013-08-24 DIAGNOSIS — N3941 Urge incontinence: Secondary | ICD-10-CM | POA: Diagnosis not present

## 2013-08-24 DIAGNOSIS — R5381 Other malaise: Secondary | ICD-10-CM | POA: Diagnosis not present

## 2013-08-24 DIAGNOSIS — R5383 Other fatigue: Secondary | ICD-10-CM | POA: Diagnosis not present

## 2013-09-01 DIAGNOSIS — M6281 Muscle weakness (generalized): Secondary | ICD-10-CM | POA: Diagnosis not present

## 2013-09-14 DIAGNOSIS — I10 Essential (primary) hypertension: Secondary | ICD-10-CM | POA: Diagnosis not present

## 2013-09-14 DIAGNOSIS — E039 Hypothyroidism, unspecified: Secondary | ICD-10-CM | POA: Diagnosis not present

## 2013-09-14 DIAGNOSIS — F329 Major depressive disorder, single episode, unspecified: Secondary | ICD-10-CM | POA: Diagnosis not present

## 2013-09-14 DIAGNOSIS — E785 Hyperlipidemia, unspecified: Secondary | ICD-10-CM | POA: Diagnosis not present

## 2013-09-14 DIAGNOSIS — F3289 Other specified depressive episodes: Secondary | ICD-10-CM | POA: Diagnosis not present

## 2013-09-27 DIAGNOSIS — Z23 Encounter for immunization: Secondary | ICD-10-CM | POA: Diagnosis not present

## 2013-10-15 DIAGNOSIS — F329 Major depressive disorder, single episode, unspecified: Secondary | ICD-10-CM | POA: Diagnosis not present

## 2013-10-15 DIAGNOSIS — I10 Essential (primary) hypertension: Secondary | ICD-10-CM | POA: Diagnosis not present

## 2013-10-15 DIAGNOSIS — E785 Hyperlipidemia, unspecified: Secondary | ICD-10-CM | POA: Diagnosis not present

## 2013-11-16 DIAGNOSIS — Z79899 Other long term (current) drug therapy: Secondary | ICD-10-CM | POA: Diagnosis not present

## 2013-11-16 DIAGNOSIS — Z5181 Encounter for therapeutic drug level monitoring: Secondary | ICD-10-CM | POA: Diagnosis not present

## 2013-12-04 DIAGNOSIS — G35 Multiple sclerosis: Secondary | ICD-10-CM | POA: Diagnosis not present

## 2013-12-04 DIAGNOSIS — E785 Hyperlipidemia, unspecified: Secondary | ICD-10-CM | POA: Diagnosis not present

## 2013-12-04 DIAGNOSIS — I10 Essential (primary) hypertension: Secondary | ICD-10-CM | POA: Diagnosis not present

## 2014-01-04 DIAGNOSIS — G35 Multiple sclerosis: Secondary | ICD-10-CM | POA: Diagnosis not present

## 2014-01-04 DIAGNOSIS — H35371 Puckering of macula, right eye: Secondary | ICD-10-CM | POA: Diagnosis not present

## 2014-01-04 DIAGNOSIS — H35372 Puckering of macula, left eye: Secondary | ICD-10-CM | POA: Diagnosis not present

## 2014-01-25 DIAGNOSIS — I10 Essential (primary) hypertension: Secondary | ICD-10-CM | POA: Diagnosis not present

## 2014-01-25 DIAGNOSIS — G35 Multiple sclerosis: Secondary | ICD-10-CM | POA: Diagnosis not present

## 2014-01-25 DIAGNOSIS — E785 Hyperlipidemia, unspecified: Secondary | ICD-10-CM | POA: Diagnosis not present

## 2014-02-17 DIAGNOSIS — Z5181 Encounter for therapeutic drug level monitoring: Secondary | ICD-10-CM | POA: Diagnosis not present

## 2014-02-17 DIAGNOSIS — Z79899 Other long term (current) drug therapy: Secondary | ICD-10-CM | POA: Diagnosis not present

## 2014-03-14 DIAGNOSIS — E785 Hyperlipidemia, unspecified: Secondary | ICD-10-CM | POA: Diagnosis not present

## 2014-03-14 DIAGNOSIS — G35 Multiple sclerosis: Secondary | ICD-10-CM | POA: Diagnosis not present

## 2014-03-14 DIAGNOSIS — I10 Essential (primary) hypertension: Secondary | ICD-10-CM | POA: Diagnosis not present

## 2014-04-12 DIAGNOSIS — G35 Multiple sclerosis: Secondary | ICD-10-CM | POA: Diagnosis not present

## 2014-04-12 DIAGNOSIS — E785 Hyperlipidemia, unspecified: Secondary | ICD-10-CM | POA: Diagnosis not present

## 2014-04-12 DIAGNOSIS — I10 Essential (primary) hypertension: Secondary | ICD-10-CM | POA: Diagnosis not present

## 2014-04-21 DIAGNOSIS — L600 Ingrowing nail: Secondary | ICD-10-CM | POA: Diagnosis not present

## 2014-05-15 DIAGNOSIS — Z79899 Other long term (current) drug therapy: Secondary | ICD-10-CM | POA: Diagnosis not present

## 2014-05-15 DIAGNOSIS — E875 Hyperkalemia: Secondary | ICD-10-CM | POA: Diagnosis not present

## 2014-05-15 DIAGNOSIS — Z5181 Encounter for therapeutic drug level monitoring: Secondary | ICD-10-CM | POA: Diagnosis not present

## 2014-05-22 DIAGNOSIS — Z79899 Other long term (current) drug therapy: Secondary | ICD-10-CM | POA: Diagnosis not present

## 2014-05-22 DIAGNOSIS — Z5181 Encounter for therapeutic drug level monitoring: Secondary | ICD-10-CM | POA: Diagnosis not present

## 2014-05-22 DIAGNOSIS — E875 Hyperkalemia: Secondary | ICD-10-CM | POA: Diagnosis not present

## 2014-06-04 DIAGNOSIS — E785 Hyperlipidemia, unspecified: Secondary | ICD-10-CM | POA: Diagnosis not present

## 2014-06-04 DIAGNOSIS — I10 Essential (primary) hypertension: Secondary | ICD-10-CM | POA: Diagnosis not present

## 2014-06-04 DIAGNOSIS — G35 Multiple sclerosis: Secondary | ICD-10-CM | POA: Diagnosis not present

## 2014-06-27 DIAGNOSIS — H5213 Myopia, bilateral: Secondary | ICD-10-CM | POA: Diagnosis not present

## 2014-06-27 DIAGNOSIS — H2513 Age-related nuclear cataract, bilateral: Secondary | ICD-10-CM | POA: Diagnosis not present

## 2014-06-27 DIAGNOSIS — H40053 Ocular hypertension, bilateral: Secondary | ICD-10-CM | POA: Diagnosis not present

## 2014-07-11 ENCOUNTER — Telehealth: Payer: Self-pay | Admitting: *Deleted

## 2014-07-11 NOTE — Telephone Encounter (Signed)
Request faxed to [facility] requesting records this is the seconds request.

## 2014-07-24 DIAGNOSIS — I10 Essential (primary) hypertension: Secondary | ICD-10-CM | POA: Diagnosis not present

## 2014-07-24 DIAGNOSIS — G35 Multiple sclerosis: Secondary | ICD-10-CM | POA: Diagnosis not present

## 2014-07-24 DIAGNOSIS — E785 Hyperlipidemia, unspecified: Secondary | ICD-10-CM | POA: Diagnosis not present

## 2014-07-26 ENCOUNTER — Ambulatory Visit (INDEPENDENT_AMBULATORY_CARE_PROVIDER_SITE_OTHER): Payer: Medicare Other | Admitting: Neurology

## 2014-07-26 ENCOUNTER — Encounter: Payer: Self-pay | Admitting: Neurology

## 2014-07-26 VITALS — BP 130/76 | HR 58 | Resp 14 | Ht 74.0 in | Wt 211.0 lb

## 2014-07-26 DIAGNOSIS — G35 Multiple sclerosis: Secondary | ICD-10-CM | POA: Diagnosis not present

## 2014-07-26 DIAGNOSIS — R251 Tremor, unspecified: Secondary | ICD-10-CM

## 2014-07-26 DIAGNOSIS — F329 Major depressive disorder, single episode, unspecified: Secondary | ICD-10-CM | POA: Insufficient documentation

## 2014-07-26 DIAGNOSIS — N3941 Urge incontinence: Secondary | ICD-10-CM | POA: Diagnosis not present

## 2014-07-26 DIAGNOSIS — F32A Depression, unspecified: Secondary | ICD-10-CM | POA: Insufficient documentation

## 2014-07-26 DIAGNOSIS — F09 Unspecified mental disorder due to known physiological condition: Secondary | ICD-10-CM | POA: Diagnosis not present

## 2014-07-26 DIAGNOSIS — R5383 Other fatigue: Secondary | ICD-10-CM | POA: Diagnosis not present

## 2014-07-26 NOTE — Progress Notes (Signed)
[facility]  PATIENT: Mark Kramer DOB: [birth year]  REFERRING DOCTOR OR PCP:  Stoney Bang SOURCE: records and imaging reports and labs from [place]  _________________________________   HISTORICAL  CHIEF COMPLAINT:  Chief Complaint  Patient presents with  . Multiple Sclerosis    Former pt. of Dr. Garth Bigness from [facility].  Sts. he continues to tolerate Gilenya well.  He does not walk at all--is in a w/c today.  Caregiver with him sts. tremors in hands, sometimes head and neck are worse.  More noticeable when he is trying to eat/fim    HISTORY OF PRESENT ILLNESS:  Mark Kramer is a 54 year old man with multiple sclerosis who I have previously seen at [facility].  MS History:   He was diagnosed with MS about 15 years ago.  He presented with a reduced gait and had multiple exacerbations his first few years.    Initially,  he was on Rebif and was switched to [place] in 2014.    He tolerates Gilenya well.     Gait/strength/sensation:   He is unable to walk and has been wheelchair bound for several years.   He does not assist in a meaningful way with transfers.   Both legs are fairly symmetrically weak and spastic -- slightly better on left at times.   He also has severe ataxia and tremor.   The tremor is very high amplitude.      He denies any significant numbness or dysesthesia.    He takes nadolol and primidone for the tremor.  Bladder/Bowel:   He is unable to void voluntarily and uses Depends.     Vision:   He denies any MS related vision issue.  He wears corrective lenses.    Fatigue/sleep:   He denies fatigue most days and sleeps 8-9 hours of sleep most days.    In the past, he had delayed sleep phae disorder that has been less of a problem in the nursing home.      Mood/cognition:  He denies any depression but had had some depression and irritability in the past. He is on Lexapro and clonazepam and tolerates that well.  Social: He is  a resident of [facility] in [place]: Constitutional: No fevers, chills, sweats, or change in appetite Eyes: No visual changes, double vision, eye pain Ear, nose and throat: No hearing loss, ear pain, nasal congestion, sore throat Cardiovascular: No chest pain, palpitations Respiratory: No shortness of breath at rest or with exertion.   No wheezes GastrointestinaI: No nausea, vomiting, diarrhea, abdominal pain, fecal incontinence Genitourinary: as above Musculoskeletal: No neck pain, back pain Integumentary: No rash, pruritus, skin lesions Neurological: as above Psychiatric: No depression at this time.  No anxiety Endocrine: No palpitations, diaphoresis, change in appetite, change in weigh or increased thirst Hematologic/Lymphatic: No anemia, purpura, petechiae. Allergic/Immunologic: No itchy/runny eyes, nasal congestion, recent allergic reactions, rashes  ALLERGIES: No Known Allergies  HOME MEDICATIONS:  Current outpatient prescriptions:  .  amLODipine (NORVASC) 10 MG tablet, Take 1 tablet (10 mg total) by mouth daily., Disp: 30 tablet, Rfl: 2 .  amphetamine-dextroamphetamine (ADDERALL) 20 MG tablet, Take 20 mg by mouth daily., Disp: , Rfl:  .  atorvastatin (LIPITOR) 80 MG tablet, Take 80 mg by mouth daily., Disp: , Rfl:  .  cholecalciferol (VITAMIN D) 1000 UNITS tablet, Take 2,000 Units by mouth daily., Disp: , Rfl:  .  clonazePAM (KLONOPIN) 1 MG tablet, Take 1 tablet (1 mg total)  by mouth 2 (two) times daily as needed for anxiety (1.5 tabs in the am, 2 tabs in the pm)., Disp: 30 tablet, Rfl: 0 .  escitalopram (LEXAPRO) 20 MG tablet, Take 20 mg by mouth daily., Disp: , Rfl:  .  Fingolimod HCl (GILENYA) 0.5 MG CAPS, Take 1 capsule by mouth daily. For Multiple Sclerosis, Disp: , Rfl:  .  furosemide (LASIX) 20 MG tablet, Take 20 mg by mouth daily., Disp: , Rfl:  .  levothyroxine (SYNTHROID, LEVOTHROID) 112 MCG tablet, Take 112 mcg by mouth daily., Disp:  , Rfl:  .  primidone (MYSOLINE) 50 MG tablet, Take 50 mg by mouth 2 (two) times daily., Disp: , Rfl:  .  desmopressin (DDAVP) 0.1 MG tablet, Take 0.1 mg by mouth at bedtime., Disp: , Rfl:  .  Dextromethorphan-Quinidine (NUEDEXTA) 20-10 MG CAPS, Take 1 capsule by mouth 2 (two) times daily. , Disp: , Rfl:  .  imipramine (TOFRANIL) 25 MG tablet, Take 50 mg by mouth at bedtime., Disp: , Rfl:  .  nadolol (CORGARD) 80 MG tablet, Take 80 mg by mouth 2 (two) times daily. , Disp: , Rfl:  .  pravastatin (PRAVACHOL) 20 MG tablet, Take 20 mg by mouth daily., Disp: , Rfl:  .  risperiDONE (RISPERDAL) 0.25 MG tablet, Take 1 tablet (0.25 mg total) by mouth at bedtime., Disp: 30 tablet, Rfl: 0  PAST MEDICAL HISTORY: Past Medical History  Diagnosis Date  . Multiple sclerosis   . Grave's disease   . High cholesterol     PAST SURGICAL HISTORY: Past Surgical History  Procedure Laterality Date  . Appendectomy      FAMILY HISTORY: History reviewed. No pertinent family history.  SOCIAL HISTORY:  History   Social History  . Marital Status: Divorced    Spouse Name: N/A  . Number of Children: N/A  . Years of Education: N/A   Occupational History  . Not on file.   Social History Main Topics  . Smoking status: Never Smoker   . Smokeless tobacco: Not on file  . Alcohol Use: No  . Drug Use: No  . Sexual Activity: Not on file   Other Topics Concern  . Not on file   Social History Narrative     PHYSICAL EXAM  Filed Vitals:   07/26/14 1423  BP: 130/76  Pulse: 58  Resp: 14  Height: 6\' 2"  (1.88 m)  Weight: 211 lb (95.709 kg)    Body mass index is 27.08 kg/(m^2).   General: The patient is well-developed and well-nourished and in no acute distress  Neck: The neck is supple, no carotid bruits are noted.  The neck is nontender.  Cardiovascular: The heart has a regular rate and rhythm with a normal S1 and S2. There were no murmurs, gallops or rubs.   Musculoskeletal:  Back is  nontender  Neurologic Exam  Mental status: The patient is alert and oriented x 3 at the time of the examination. The patient has apparent normal recent and remote memory, with a reduced attention span and concentration ability.   Speech is normal.  Cranial nerves: Extraocular movements are full. Pupils are equal, round, and reactive to light and accomodation.  Visual fields are full.  Facial symmetry is present. There is good facial sensation to soft touch bilaterally.Facial strength is normal.  Trapezius and sternocleidomastoid strength is normal. No dysarthria is noted.  The tongue is midline, and the patient has symmetric elevation of the soft palate. No obvious hearing deficits are noted.  Motor:  There is a mild rest tremor but a more severe intention tremor, especially on the left.  Muscle bulk is normal.   Tone is increased in the legs, left worse than right. Strength is 2 minus/5 left and 2/5 right.   Sensory: Sensory testing is intact to pinprick, soft touch and vibration sensation in all 4 extremities.  Coordination: Cerebellar testing reveals poor finger-nose-finger.  Gait and station: He is unable to stand or walk.   Reflexes: Deep tendon reflexes are normal in the arms but increase in the legs bilaterally.     DIAGNOSTIC DATA (LABS, IMAGING, TESTING) - I reviewed patient records, labs, notes, testing and imaging myself where available.  Lab Results  Component Value Date   WBC 3.1* 04/09/2012   HGB 16.2 04/09/2012   HCT 47.9 04/09/2012   MCV 86.5 04/09/2012   PLT 174 04/09/2012      Component Value Date/Time   NA 138 04/09/2012 0745   K 3.6 04/09/2012 0745   CL 101 04/09/2012 0745   CO2 25 04/09/2012 0745   GLUCOSE 75 04/09/2012 0745   BUN 26* 04/09/2012 0745   CREATININE 0.90 04/09/2012 0745   CALCIUM 9.1 04/09/2012 0745   PROT 7.5 04/08/2012 2345   ALBUMIN 3.7 04/08/2012 2345   AST 19 04/08/2012 2345   ALT 12 04/08/2012 2345   ALKPHOS 88 04/08/2012 2345    BILITOT 0.7 04/08/2012 2345   GFRNONAA >90 04/09/2012 0745   GFRAA >90 04/09/2012 0745   No results found for: CHOL, HDL, LDLCALC, LDLDIRECT, TRIG, CHOLHDL No results found for: HGBA1C No results found for: VITAMINB12 Lab Results  Component Value Date   TSH 2.629 04/09/2012       ASSESSMENT AND PLAN  Multiple sclerosis - Plan: CBC with Differential/Platelet, Hepatic Function Panel  Tremor  Other fatigue  Urge incontinence  Cognitive dysfunction   1.  Continue Gilenya for MS. I will check CBC with differential and hepatic function test today. 2.   Continue primidone almond in her low on his for tremor escitalopram for depression. 3.   Return to see me in 6 months or sooner if there are new or worsening neurologic symptoms.   Candon A. Felecia Shelling, MD, PhD [id number], 0000000 PM Certified in Neurology, Clinical Neurophysiology, Sleep Medicine, Pain Medicine and Neuroimaging  [facility] [address], [place], [place] [address] [phone]

## 2014-07-27 LAB — HEPATIC FUNCTION PANEL
ALBUMIN: 4.6 g/dL (ref 3.5–5.5)
ALT: 26 IU/L (ref 0–44)
AST: 40 IU/L (ref 0–40)
Alkaline Phosphatase: 124 IU/L — ABNORMAL HIGH (ref 39–117)
BILIRUBIN TOTAL: 0.4 mg/dL (ref 0.0–1.2)
Bilirubin, Direct: 0.1 mg/dL (ref 0.00–0.40)
Total Protein: 7.2 g/dL (ref 6.0–8.5)

## 2014-07-27 LAB — CBC WITH DIFFERENTIAL/PLATELET
Basophils Absolute: 0 10*3/uL (ref 0.0–0.2)
Basos: 1 %
EOS (ABSOLUTE): 0 10*3/uL (ref 0.0–0.4)
Eos: 1 %
Hematocrit: 49.1 % (ref 37.5–51.0)
Hemoglobin: 16.6 g/dL (ref 12.6–17.7)
IMMATURE GRANS (ABS): 0.1 10*3/uL (ref 0.0–0.1)
IMMATURE GRANULOCYTES: 1 %
Lymphocytes Absolute: 0.7 10*3/uL (ref 0.7–3.1)
Lymphs: 16 %
MCH: 28.2 pg (ref 26.6–33.0)
MCHC: 33.8 g/dL (ref 31.5–35.7)
MCV: 83 fL (ref 79–97)
MONOCYTES: 18 %
Monocytes Absolute: 0.8 10*3/uL (ref 0.1–0.9)
NEUTROS PCT: 63 %
Neutrophils Absolute: 2.7 10*3/uL (ref 1.4–7.0)
Platelets: 199 10*3/uL (ref 150–379)
RBC: 5.89 x10E6/uL — ABNORMAL HIGH (ref 4.14–5.80)
RDW: 15.3 % (ref 12.3–15.4)
WBC: 4.3 10*3/uL (ref 3.4–10.8)

## 2014-08-14 DIAGNOSIS — M6281 Muscle weakness (generalized): Secondary | ICD-10-CM | POA: Diagnosis not present

## 2014-08-15 DIAGNOSIS — M6281 Muscle weakness (generalized): Secondary | ICD-10-CM | POA: Diagnosis not present

## 2014-08-16 DIAGNOSIS — Z5181 Encounter for therapeutic drug level monitoring: Secondary | ICD-10-CM | POA: Diagnosis not present

## 2014-08-16 DIAGNOSIS — Z79899 Other long term (current) drug therapy: Secondary | ICD-10-CM | POA: Diagnosis not present

## 2014-08-16 DIAGNOSIS — M6281 Muscle weakness (generalized): Secondary | ICD-10-CM | POA: Diagnosis not present

## 2014-08-17 DIAGNOSIS — M6281 Muscle weakness (generalized): Secondary | ICD-10-CM | POA: Diagnosis not present

## 2014-08-18 DIAGNOSIS — M6281 Muscle weakness (generalized): Secondary | ICD-10-CM | POA: Diagnosis not present

## 2014-08-21 DIAGNOSIS — M6281 Muscle weakness (generalized): Secondary | ICD-10-CM | POA: Diagnosis not present

## 2014-08-22 DIAGNOSIS — M6281 Muscle weakness (generalized): Secondary | ICD-10-CM | POA: Diagnosis not present

## 2014-08-23 DIAGNOSIS — M6281 Muscle weakness (generalized): Secondary | ICD-10-CM | POA: Diagnosis not present

## 2014-08-24 DIAGNOSIS — M6281 Muscle weakness (generalized): Secondary | ICD-10-CM | POA: Diagnosis not present

## 2014-08-28 DIAGNOSIS — M6281 Muscle weakness (generalized): Secondary | ICD-10-CM | POA: Diagnosis not present

## 2014-08-30 DIAGNOSIS — M6281 Muscle weakness (generalized): Secondary | ICD-10-CM | POA: Diagnosis not present

## 2014-08-31 DIAGNOSIS — M6281 Muscle weakness (generalized): Secondary | ICD-10-CM | POA: Diagnosis not present

## 2014-09-01 DIAGNOSIS — M6281 Muscle weakness (generalized): Secondary | ICD-10-CM | POA: Diagnosis not present

## 2014-09-04 DIAGNOSIS — M6281 Muscle weakness (generalized): Secondary | ICD-10-CM | POA: Diagnosis not present

## 2014-09-06 DIAGNOSIS — M6281 Muscle weakness (generalized): Secondary | ICD-10-CM | POA: Diagnosis not present

## 2014-09-09 DIAGNOSIS — G35 Multiple sclerosis: Secondary | ICD-10-CM | POA: Diagnosis not present

## 2014-09-09 DIAGNOSIS — E785 Hyperlipidemia, unspecified: Secondary | ICD-10-CM | POA: Diagnosis not present

## 2014-09-09 DIAGNOSIS — I10 Essential (primary) hypertension: Secondary | ICD-10-CM | POA: Diagnosis not present

## 2014-09-13 DIAGNOSIS — M6281 Muscle weakness (generalized): Secondary | ICD-10-CM | POA: Diagnosis not present

## 2014-09-14 DIAGNOSIS — M6281 Muscle weakness (generalized): Secondary | ICD-10-CM | POA: Diagnosis not present

## 2014-09-19 DIAGNOSIS — M6281 Muscle weakness (generalized): Secondary | ICD-10-CM | POA: Diagnosis not present

## 2014-10-09 DIAGNOSIS — L600 Ingrowing nail: Secondary | ICD-10-CM | POA: Diagnosis not present

## 2014-10-16 DIAGNOSIS — Z23 Encounter for immunization: Secondary | ICD-10-CM | POA: Diagnosis not present

## 2014-12-06 DIAGNOSIS — G35 Multiple sclerosis: Secondary | ICD-10-CM | POA: Diagnosis not present

## 2014-12-06 DIAGNOSIS — B351 Tinea unguium: Secondary | ICD-10-CM | POA: Diagnosis not present

## 2014-12-06 DIAGNOSIS — M79672 Pain in left foot: Secondary | ICD-10-CM | POA: Diagnosis not present

## 2014-12-06 DIAGNOSIS — M79671 Pain in right foot: Secondary | ICD-10-CM | POA: Diagnosis not present

## 2014-12-13 DIAGNOSIS — I10 Essential (primary) hypertension: Secondary | ICD-10-CM | POA: Diagnosis not present

## 2014-12-13 DIAGNOSIS — E784 Other hyperlipidemia: Secondary | ICD-10-CM | POA: Diagnosis not present

## 2014-12-13 DIAGNOSIS — G35 Multiple sclerosis: Secondary | ICD-10-CM | POA: Diagnosis not present

## 2014-12-14 DIAGNOSIS — H538 Other visual disturbances: Secondary | ICD-10-CM | POA: Diagnosis not present

## 2014-12-14 DIAGNOSIS — H2513 Age-related nuclear cataract, bilateral: Secondary | ICD-10-CM | POA: Diagnosis not present

## 2015-01-30 ENCOUNTER — Ambulatory Visit: Payer: Medicare Other | Admitting: Neurology

## 2015-01-31 ENCOUNTER — Ambulatory Visit: Payer: Medicare Other | Admitting: Neurology

## 2015-01-31 DIAGNOSIS — G35 Multiple sclerosis: Secondary | ICD-10-CM | POA: Diagnosis not present

## 2015-01-31 DIAGNOSIS — E784 Other hyperlipidemia: Secondary | ICD-10-CM | POA: Diagnosis not present

## 2015-01-31 DIAGNOSIS — I10 Essential (primary) hypertension: Secondary | ICD-10-CM | POA: Diagnosis not present

## 2015-02-01 ENCOUNTER — Encounter: Payer: Self-pay | Admitting: Neurology

## 2015-02-01 ENCOUNTER — Telehealth: Payer: Self-pay | Admitting: Neurology

## 2015-02-01 ENCOUNTER — Ambulatory Visit (INDEPENDENT_AMBULATORY_CARE_PROVIDER_SITE_OTHER): Payer: Medicare Other | Admitting: Neurology

## 2015-02-01 DIAGNOSIS — F09 Unspecified mental disorder due to known physiological condition: Secondary | ICD-10-CM | POA: Diagnosis not present

## 2015-02-01 DIAGNOSIS — G35 Multiple sclerosis: Secondary | ICD-10-CM

## 2015-02-01 DIAGNOSIS — R251 Tremor, unspecified: Secondary | ICD-10-CM

## 2015-02-01 DIAGNOSIS — N3941 Urge incontinence: Secondary | ICD-10-CM | POA: Diagnosis not present

## 2015-02-01 DIAGNOSIS — F32A Depression, unspecified: Secondary | ICD-10-CM

## 2015-02-01 DIAGNOSIS — R5383 Other fatigue: Secondary | ICD-10-CM

## 2015-02-01 DIAGNOSIS — F329 Major depressive disorder, single episode, unspecified: Secondary | ICD-10-CM

## 2015-02-01 NOTE — Telephone Encounter (Signed)
I have spoken with Mark Kramer this afternoon and advised that pt. had cbc with diff, lft's and tsh drawn during his ov today.  She verbalized understanding of same/fim

## 2015-02-01 NOTE — Telephone Encounter (Signed)
[facility] [phone] called regarding paperwork that came back with patient from visit with Dr. Felecia Shelling today. Paperwork said new orders, CBC, Hepatic function panel, TSH. Marlowe Kays needs clarification if this is indeed a new order or this is an old order. Medications were listed that patient is not even taking right now.

## 2015-02-01 NOTE — Progress Notes (Signed)
[facility]  PATIENT: Mark Kramer DOB: 11/19/1960  REFERRING DOCTOR OR PCP:  Stoney Bang SOURCE: records and imaging reports and labs from [place]  _________________________________   HISTORICAL  CHIEF COMPLAINT:  Chief Complaint  Patient presents with  . Multiple Sclerosis    Sts. tremor is about the same.  Denies new or worsening sx./fim  . Tremors    HISTORY OF PRESENT ILLNESS:  Mark Kramer is a 55 year old man with multiple sclerosis.   He appears to have a relapsing form of secondary progressive MS.    Initially,  he was on Rebif and was switched to [place] in 2014.    He tolerates Gilenya well.    MS History:   He was diagnosed with MS about 2001.  He presented with a reduced gait and had multiple exacerbations his first few years.     Gait/strength/sensation:   He is unable to walk and has been wheelchair bound for several years.   He does not assist in a meaningful way with transfers.   Both legs are fairly symmetrically weak and spastic -- slightly better on left at times.   He also has severe ataxia and tremor.   The tremor is very high amplitude.      He denies any significant numbness or dysesthesia.    He takes nadolol and primidone for the tremor.  Bladder/Bowel:   He is unable to void voluntarily and uses Depends.     Vision:   He denies any MS related vision issue.  He wears corrective lenses.    Fatigue/sleep:   He denies fatigue most days.   Sleep is better, now sleeping from 9 pm to 5 am.  In the past, he had delayed sleep phae disorder.      Mood/cognition:  He denies any depression but had had some depression and irritability in the past. He is on Lexapro and clonazepam and tolerates that well.   He continues to have some cognitive dysfunction but this appears to be stable.  Social: He is a resident of [facility] in [place]: Constitutional: No fevers, chills, sweats, or change in  appetite Eyes: No visual changes, double vision, eye pain Ear, nose and throat: No hearing loss, ear pain, nasal congestion, sore throat Cardiovascular: No chest pain, palpitations Respiratory: No shortness of breath at rest or with exertion.   No wheezes GastrointestinaI: No nausea, vomiting, diarrhea, abdominal pain, fecal incontinence Genitourinary: as above Musculoskeletal: No neck pain, back pain Integumentary: No rash, pruritus, skin lesions Neurological: as above Psychiatric: No depression at this time.  No anxiety Endocrine: No palpitations, diaphoresis, change in appetite, change in weigh or increased thirst Hematologic/Lymphatic: No anemia, purpura, petechiae. Allergic/Immunologic: No itchy/runny eyes, nasal congestion, recent allergic reactions, rashes  ALLERGIES: No Known Allergies  HOME MEDICATIONS:  Current outpatient prescriptions:  .  amLODipine (NORVASC) 10 MG tablet, Take 1 tablet (10 mg total) by mouth daily., Disp: 30 tablet, Rfl: 2 .  amphetamine-dextroamphetamine (ADDERALL) 20 MG tablet, Take 20 mg by mouth daily., Disp: , Rfl:  .  atorvastatin (LIPITOR) 80 MG tablet, Take 80 mg by mouth daily., Disp: , Rfl:  .  cholecalciferol (VITAMIN D) 1000 UNITS tablet, Take 2,000 Units by mouth daily., Disp: , Rfl:  .  Fingolimod HCl (GILENYA) 0.5 MG CAPS, Take 1 capsule by mouth daily. For Multiple Sclerosis, Disp: , Rfl:  .  furosemide (LASIX) 20 MG tablet, Take 20 mg by mouth daily., Disp: ,  Rfl:  .  levothyroxine (SYNTHROID, LEVOTHROID) 112 MCG tablet, Take 112 mcg by mouth daily., Disp: , Rfl:  .  nadolol (CORGARD) 80 MG tablet, Take 80 mg by mouth 2 (two) times daily. , Disp: , Rfl:  .  polyethylene glycol powder (GLYCOLAX/MIRALAX) powder, , Disp: , Rfl:  .  primidone (MYSOLINE) 50 MG tablet, Take 50 mg by mouth 2 (two) times daily., Disp: , Rfl:  .  clonazePAM (KLONOPIN) 1 MG tablet, Take 1 tablet (1 mg total) by mouth 2 (two) times daily as needed for anxiety  (1.5 tabs in the am, 2 tabs in the pm). (Patient not taking: Reported on 02/01/2015), Disp: 30 tablet, Rfl: 0 .  desmopressin (DDAVP) 0.1 MG tablet, Take 0.1 mg by mouth at bedtime. Reported on 02/01/2015, Disp: , Rfl:  .  Dextromethorphan-Quinidine (NUEDEXTA) 20-10 MG CAPS, Take 1 capsule by mouth 2 (two) times daily. Reported on 02/01/2015, Disp: , Rfl:  .  escitalopram (LEXAPRO) 20 MG tablet, Take 20 mg by mouth daily. Reported on 02/01/2015, Disp: , Rfl:  .  imipramine (TOFRANIL) 25 MG tablet, Take 50 mg by mouth at bedtime. Reported on 02/01/2015, Disp: , Rfl:  .  pravastatin (PRAVACHOL) 20 MG tablet, Take 20 mg by mouth daily. Reported on 02/01/2015, Disp: , Rfl:  .  risperiDONE (RISPERDAL) 0.25 MG tablet, Take 1 tablet (0.25 mg total) by mouth at bedtime. (Patient not taking: Reported on 02/01/2015), Disp: 30 tablet, Rfl: 0  PAST MEDICAL HISTORY: Past Medical History  Diagnosis Date  . Multiple sclerosis ([place])   . Grave's disease   . High cholesterol     PAST SURGICAL HISTORY: Past Surgical History  Procedure Laterality Date  . Appendectomy      FAMILY HISTORY: History reviewed. No pertinent family history.  SOCIAL HISTORY:  Social History   Social History  . Marital Status: Divorced    Spouse Name: N/A  . Number of Children: N/A  . Years of Education: N/A   Occupational History  . Not on file.   Social History Main Topics  . Smoking status: Never Smoker   . Smokeless tobacco: Not on file  . Alcohol Use: No  . Drug Use: No  . Sexual Activity: Not on file   Other Topics Concern  . Not on file   Social History Narrative     PHYSICAL EXAM  Filed Vitals:   02/01/15 1307  BP: 134/78  Pulse: 68  Resp: 16  Height: 6\' 2"  (1.88 m)  Weight: 208 lb (94.348 kg)    Body mass index is 26.69 kg/(m^2).   General: The patient is well-developed and well-nourished and in no acute distress   Neurologic Exam  Mental status: The patient is alert and oriented x 3 at the  time of the examination. The patient has apparent normal recent and remote memory, with a reduced attention span and concentration ability.   Speech is normal.  Cranial nerves: Extraocular movements are full. Pupils are equal, round, and reactive to light and accomodation.    Facial symmetry is present. There is good facial sensation to soft touch bilaterally.Facial strength is normal.  Trapezius and sternocleidomastoid strength is normal. No dysarthria is noted.  The tongue is midline, and the patient has symmetric elevation of the soft palate. No obvious hearing deficits are noted.  Motor:  There is a mild rest tremor and moderastely severe intention tremor, L > R  Muscle bulk is normal.   Tone is increased in the legs, left  worse than right. Strength is 5/5 in arms and 2-/5 in legs  Sensory: Sensory testing is intact to pinprick, soft touch and vibration sensation in all 4 extremities.  Coordination: Cerebellar testing shows poor finger-nose-finger.  Gait and station: He is unable to stand or walk.   Reflexes: Deep tendon reflexes are normal in the arms but increase in the legs bilaterally.     DIAGNOSTIC DATA (LABS, IMAGING, TESTING) - I reviewed patient records, labs, notes, testing and imaging myself where available.  Lab Results  Component Value Date   WBC 4.3 07/26/2014   HGB 16.2 04/09/2012   HCT 49.1 07/26/2014   MCV 83 07/26/2014   PLT 199 07/26/2014      Component Value Date/Time   NA 138 04/09/2012 0745   K 3.6 04/09/2012 0745   CL 101 04/09/2012 0745   CO2 25 04/09/2012 0745   GLUCOSE 75 04/09/2012 0745   BUN 26* 04/09/2012 0745   CREATININE 0.90 04/09/2012 0745   CALCIUM 9.1 04/09/2012 0745   PROT 7.2 07/26/2014 1514   PROT 7.5 04/08/2012 2345   ALBUMIN 4.6 07/26/2014 1514   ALBUMIN 3.7 04/08/2012 2345   AST 40 07/26/2014 1514   ALT 26 07/26/2014 1514   ALKPHOS 124* 07/26/2014 1514   BILITOT 0.4 07/26/2014 1514   BILITOT 0.7 04/08/2012 2345   GFRNONAA >90  04/09/2012 0745   GFRAA >90 04/09/2012 0745   No results found for: CHOL, HDL, LDLCALC, LDLDIRECT, TRIG, CHOLHDL No results found for: HGBA1C No results found for: VITAMINB12 Lab Results  Component Value Date   TSH 2.629 04/09/2012       ASSESSMENT AND PLAN  Multiple sclerosis (HCC)  Cognitive dysfunction  Depression  Other fatigue  Tremor  Urge incontinence   1.  Continue Gilenya for MS. I will check CBC with differential and hepatic function test today. 2.   Continue primidone and nadolol for tremor  And escitalopram for depression. 3.   Return to see me in 6 months or sooner if there are new or worsening neurologic symptoms.   Odyn A. Felecia Shelling, MD, PhD [id number], [id number] PM Certified in Neurology, Clinical Neurophysiology, Sleep Medicine, Pain Medicine and Neuroimaging  [facility] [address], [place], [place] [address] [phone]

## 2015-02-02 LAB — HEPATIC FUNCTION PANEL
ALBUMIN: 3.9 g/dL (ref 3.5–5.5)
ALK PHOS: 125 IU/L — AB (ref 39–117)
ALT: 21 IU/L (ref 0–44)
AST: 23 IU/L (ref 0–40)
BILIRUBIN, DIRECT: 0.15 mg/dL (ref 0.00–0.40)
Bilirubin Total: 0.5 mg/dL (ref 0.0–1.2)
TOTAL PROTEIN: 6.8 g/dL (ref 6.0–8.5)

## 2015-02-02 LAB — CBC WITH DIFFERENTIAL/PLATELET
BASOS ABS: 0 10*3/uL (ref 0.0–0.2)
Basos: 0 %
EOS (ABSOLUTE): 0.1 10*3/uL (ref 0.0–0.4)
Eos: 2 %
HEMOGLOBIN: 17 g/dL (ref 12.6–17.7)
Hematocrit: 50 % (ref 37.5–51.0)
IMMATURE GRANS (ABS): 0 10*3/uL (ref 0.0–0.1)
IMMATURE GRANULOCYTES: 1 %
LYMPHS: 16 %
Lymphocytes Absolute: 0.7 10*3/uL (ref 0.7–3.1)
MCH: 28.3 pg (ref 26.6–33.0)
MCHC: 34 g/dL (ref 31.5–35.7)
MCV: 83 fL (ref 79–97)
MONOCYTES: 9 %
Monocytes Absolute: 0.4 10*3/uL (ref 0.1–0.9)
NEUTROS PCT: 72 %
Neutrophils Absolute: 3 10*3/uL (ref 1.4–7.0)
PLATELETS: 228 10*3/uL (ref 150–379)
RBC: 6.01 x10E6/uL — AB (ref 4.14–5.80)
RDW: 15.2 % (ref 12.3–15.4)
WBC: 4.2 10*3/uL (ref 3.4–10.8)

## 2015-02-02 LAB — TSH: TSH: 2.29 u[IU]/mL (ref 0.450–4.500)

## 2015-02-05 ENCOUNTER — Telehealth: Payer: Self-pay | Admitting: *Deleted

## 2015-02-05 NOTE — Telephone Encounter (Signed)
I have spoken with Rosa this morning and per RAS, advised that labs done in our office last week were fine.  She verbalized understanding of same/fim

## 2015-02-05 NOTE — Telephone Encounter (Signed)
-----   Message from [name], MD sent at 02/02/2015  5:07 PM EST ----- Please let his assisted living or Mr. Mark Kramer know that the lab work is fine.

## 2015-03-21 DIAGNOSIS — E784 Other hyperlipidemia: Secondary | ICD-10-CM | POA: Diagnosis not present

## 2015-03-21 DIAGNOSIS — G35 Multiple sclerosis: Secondary | ICD-10-CM | POA: Diagnosis not present

## 2015-03-21 DIAGNOSIS — I10 Essential (primary) hypertension: Secondary | ICD-10-CM | POA: Diagnosis not present

## 2015-05-06 DIAGNOSIS — I10 Essential (primary) hypertension: Secondary | ICD-10-CM | POA: Diagnosis not present

## 2015-05-06 DIAGNOSIS — G35 Multiple sclerosis: Secondary | ICD-10-CM | POA: Diagnosis not present

## 2015-05-06 DIAGNOSIS — E784 Other hyperlipidemia: Secondary | ICD-10-CM | POA: Diagnosis not present

## 2015-05-15 DIAGNOSIS — Z79899 Other long term (current) drug therapy: Secondary | ICD-10-CM | POA: Diagnosis not present

## 2015-05-15 DIAGNOSIS — Z5181 Encounter for therapeutic drug level monitoring: Secondary | ICD-10-CM | POA: Diagnosis not present

## 2015-06-20 DIAGNOSIS — E784 Other hyperlipidemia: Secondary | ICD-10-CM | POA: Diagnosis not present

## 2015-06-20 DIAGNOSIS — I10 Essential (primary) hypertension: Secondary | ICD-10-CM | POA: Diagnosis not present

## 2015-06-20 DIAGNOSIS — G35 Multiple sclerosis: Secondary | ICD-10-CM | POA: Diagnosis not present

## 2015-08-01 ENCOUNTER — Ambulatory Visit: Payer: Medicare Other | Admitting: Neurology

## 2015-08-03 DIAGNOSIS — I10 Essential (primary) hypertension: Secondary | ICD-10-CM | POA: Diagnosis not present

## 2015-08-03 DIAGNOSIS — E784 Other hyperlipidemia: Secondary | ICD-10-CM | POA: Diagnosis not present

## 2015-08-03 DIAGNOSIS — G35 Multiple sclerosis: Secondary | ICD-10-CM | POA: Diagnosis not present

## 2015-08-23 ENCOUNTER — Encounter: Payer: Self-pay | Admitting: Neurology

## 2015-08-23 ENCOUNTER — Ambulatory Visit (INDEPENDENT_AMBULATORY_CARE_PROVIDER_SITE_OTHER): Payer: Medicare Other | Admitting: Neurology

## 2015-08-23 VITALS — BP 151/83 | HR 53 | Resp 18

## 2015-08-23 DIAGNOSIS — N3941 Urge incontinence: Secondary | ICD-10-CM

## 2015-08-23 DIAGNOSIS — F09 Unspecified mental disorder due to known physiological condition: Secondary | ICD-10-CM

## 2015-08-23 DIAGNOSIS — F32A Depression, unspecified: Secondary | ICD-10-CM

## 2015-08-23 DIAGNOSIS — G35 Multiple sclerosis: Secondary | ICD-10-CM

## 2015-08-23 DIAGNOSIS — R251 Tremor, unspecified: Secondary | ICD-10-CM | POA: Diagnosis not present

## 2015-08-23 DIAGNOSIS — F329 Major depressive disorder, single episode, unspecified: Secondary | ICD-10-CM | POA: Diagnosis not present

## 2015-08-23 DIAGNOSIS — G35D Multiple sclerosis, unspecified: Secondary | ICD-10-CM

## 2015-08-23 DIAGNOSIS — R5383 Other fatigue: Secondary | ICD-10-CM

## 2015-08-23 NOTE — Progress Notes (Signed)
[facility]  PATIENT: Mark Kramer DOB: Mar 05, 1960  REFERRING DOCTOR OR PCP:  Stoney Bang SOURCE: records and imaging reports and labs from [facility]  _________________________________   HISTORICAL  CHIEF COMPLAINT:  Chief Complaint  Patient presents with  . Multiple Sclerosis    Rm 13. MS F/U. Patient is here with 2 care givers.     HISTORY OF PRESENT ILLNESS:  Mark Kramer is a 55 year old man with multiple sclerosis.   He appears to have a relapsing form of secondary progressive MS.    Initially,  he was on Rebif and was switched to [place] in 2014.    He tolerates Gilenya well.    MS History:   He was diagnosed with MS about 2001.  He presented with a reduced gait and had multiple exacerbations his first few years.     Gait/strength/sensation:   He is unable to walk and has been wheelchair bound for several years.   He does not assist much with transfers but can bear some weight.   Left leg is mildly weaker and more spastic.    He also has severe ataxia and tremor.   The tremor is high amplitude, worse on his left      He denies any significant numbness or dysesthesia.    He takes nadolol and primidone for the tremor with some benefit  Bladder/Bowel:   He is unable to void voluntarily and uses Depends.     Vision:   He denies any MS related vision issue. No diplopia.   He wears corrective lenses.    Fatigue/sleep:   He denies fatigue .   Sleep is better, now sleeping from 9 pm to 5 am.  In the past, he had delayed sleep phase disorder and was up to 2-3 am most nights.         Mood/cognition:  He denies current depression but gets frustrated easily.   He had some depression and irritability in the past. He is on Lexapro and clonazepam and tolerates that well.   He continues to have  cognitive dysfunction but this appears to be stable.  Social: He is a resident of [facility] in [place]: Constitutional: No fevers,  chills, sweats, or change in appetite Eyes: No visual changes, double vision, eye pain Ear, nose and throat: No hearing loss, ear pain, nasal congestion, sore throat Cardiovascular: No chest pain, palpitations Respiratory: No shortness of breath at rest or with exertion.   No wheezes GastrointestinaI: No nausea, vomiting, diarrhea, abdominal pain, fecal incontinence Genitourinary: as above Musculoskeletal: No neck pain, back pain Integumentary: No rash, pruritus, skin lesions Neurological: as above Psychiatric: No depression at this time.  No anxiety Endocrine: No palpitations, diaphoresis, change in appetite, change in weigh or increased thirst Hematologic/Lymphatic: No anemia, purpura, petechiae. Allergic/Immunologic: No itchy/runny eyes, nasal congestion, recent allergic reactions, rashes  ALLERGIES: No Known Allergies  HOME MEDICATIONS:  Current Outpatient Prescriptions:  .  acetaminophen (TYLENOL) 650 MG CR tablet, Take 650 mg by mouth every 8 (eight) hours as needed for pain., Disp: , Rfl:  .  amLODipine (NORVASC) 10 MG tablet, Take 1 tablet (10 mg total) by mouth daily., Disp: 30 tablet, Rfl: 2 .  amphetamine-dextroamphetamine (ADDERALL) 20 MG tablet, Take 20 mg by mouth daily., Disp: , Rfl:  .  atorvastatin (LIPITOR) 80 MG tablet, Take 80 mg by mouth daily., Disp: , Rfl:  .  benzocaine-menthol (CHLORASEPTIC) 6-10 MG lozenge, Take 1 lozenge by mouth  as needed for sore throat., Disp: , Rfl:  .  calcium-vitamin D (OSCAL WITH D) 500-200 MG-UNIT tablet, Take 1 tablet by mouth., Disp: , Rfl:  .  cholecalciferol (VITAMIN D) 1000 UNITS tablet, Take 2,000 Units by mouth daily., Disp: , Rfl:  .  escitalopram (LEXAPRO) 20 MG tablet, Take 20 mg by mouth daily. Reported on 02/01/2015, Disp: , Rfl:  .  Fingolimod HCl (GILENYA) 0.5 MG CAPS, Take 1 capsule by mouth daily. For Multiple Sclerosis, Disp: , Rfl:  .  furosemide (LASIX) 20 MG tablet, Take 20 mg by mouth daily., Disp: , Rfl:  .   hydroxypropyl methylcellulose / hypromellose (ISOPTO TEARS / GONIOVISC) 2.5 % ophthalmic solution, Place 1 drop into the right eye., Disp: , Rfl:  .  ibuprofen (ADVIL,MOTRIN) 800 MG tablet, , Disp: , Rfl:  .  imipramine (TOFRANIL) 25 MG tablet, Take 50 mg by mouth at bedtime. Reported on 02/01/2015, Disp: , Rfl:  .  levothyroxine (SYNTHROID, LEVOTHROID) 112 MCG tablet, Take 112 mcg by mouth daily., Disp: , Rfl:  .  Multiple Vitamin (MULTIVITAMIN) capsule, Take 1 capsule by mouth daily., Disp: , Rfl:  .  nadolol (CORGARD) 80 MG tablet, Take 80 mg by mouth 2 (two) times daily. , Disp: , Rfl:  .  polyethylene glycol powder (GLYCOLAX/MIRALAX) powder, , Disp: , Rfl:  .  primidone (MYSOLINE) 50 MG tablet, Take 50 mg by mouth 2 (two) times daily., Disp: , Rfl:   PAST MEDICAL HISTORY: Past Medical History:  Diagnosis Date  . Grave's disease   . High cholesterol   . Multiple sclerosis ([place])     PAST SURGICAL HISTORY: Past Surgical History:  Procedure Laterality Date  . APPENDECTOMY      FAMILY HISTORY: History reviewed. No pertinent family history.  SOCIAL HISTORY:  Social History   Social History  . Marital status: Divorced    Spouse name: N/A  . Number of children: N/A  . Years of education: N/A   Occupational History  . Not on file.   Social History Main Topics  . Smoking status: Never Smoker  . Smokeless tobacco: Not on file  . Alcohol use No  . Drug use: No  . Sexual activity: Not on file   Other Topics Concern  . Not on file   Social History Narrative  . No narrative on file     PHYSICAL EXAM  Vitals:   08/23/15 0945  BP: (!) 151/83  Pulse: (!) 53  Resp: 18    There is no height or weight on file to calculate BMI.   General: The patient is well-developed and well-nourished and in no acute distress   Neurologic Exam  Mental status: The patient is alert and oriented x 3 at the time of the examination. The patient has apparent normal recent and remote  memory, with a reduced attention span and concentration ability.   Speech is normal.  Cranial nerves: Extraocular movements are full.  There is good facial sensation to soft touch bilaterally.Facial strength is normal.  Trapezius and sternocleidomastoid strength is normal. No dysarthria is noted.  The tongue is midline, and the patient has symmetric elevation of the soft palate. No obvious hearing deficits are noted.  Motor:  There is a mild rest tremor and moderastely severe intention tremor, L > R  Muscle bulk is normal.   Tone is increased in the legs, left worse than right. Strength is 5/5 in arms and 2-/5 in legs  Sensory: Sensory testing is intact  to pinprick, soft touch and vibration sensation in all 4 extremities.  Coordination: Cerebellar testing shows poor finger-nose-finger.  Gait and station: He is unable to stand or walk.   Reflexes: Deep tendon reflexes are normal in the arms but increase in the legs bilaterally.     DIAGNOSTIC DATA (LABS, IMAGING, TESTING) - I reviewed patient records, labs, notes, testing and imaging myself where available.  Lab Results  Component Value Date   WBC 4.2 02/01/2015   HGB 16.2 04/09/2012   HCT 50.0 02/01/2015   MCV 83 02/01/2015   PLT 228 02/01/2015      Component Value Date/Time   NA 138 04/09/2012 0745   K 3.6 04/09/2012 0745   CL 101 04/09/2012 0745   CO2 25 04/09/2012 0745   GLUCOSE 75 04/09/2012 0745   BUN 26 (H) 04/09/2012 0745   CREATININE 0.90 04/09/2012 0745   CALCIUM 9.1 04/09/2012 0745   PROT 6.8 02/01/2015 1341   ALBUMIN 3.9 02/01/2015 1341   AST 23 02/01/2015 1341   ALT 21 02/01/2015 1341   ALKPHOS 125 (H) 02/01/2015 1341   BILITOT 0.5 02/01/2015 1341   GFRNONAA >90 04/09/2012 0745   GFRAA >90 04/09/2012 0745   No results found for: CHOL, HDL, LDLCALC, LDLDIRECT, TRIG, CHOLHDL No results found for: HGBA1C No results found for: VITAMINB12 Lab Results  Component Value Date   TSH 2.290 02/01/2015        ASSESSMENT AND PLAN  Multiple sclerosis (HCC)  Cognitive dysfunction  Tremor  Other fatigue  Urge incontinence  Depression   1.  Continue Gilenya for MS. I will check CBC with differential and hepatic function test today.     2.   Increase  primidone and continue nadolol for tremor Continue  escitalopram for depression. 3.   Return to see me in 6 months or sooner if there are new or worsening neurologic symptoms.   Aureliano A. Felecia Shelling, MD, PhD [id number], [id number] AM Certified in Neurology, Clinical Neurophysiology, Sleep Medicine, Pain Medicine and Neuroimaging  [facility] [address], [place], [place] [address] [phone]

## 2015-08-24 LAB — CBC WITH DIFFERENTIAL/PLATELET
BASOS: 0 %
Basophils Absolute: 0 10*3/uL (ref 0.0–0.2)
EOS (ABSOLUTE): 0 10*3/uL (ref 0.0–0.4)
Eos: 1 %
HEMATOCRIT: 53.8 % — AB (ref 37.5–51.0)
Hemoglobin: 17.7 g/dL (ref 12.6–17.7)
Immature Grans (Abs): 0 10*3/uL (ref 0.0–0.1)
Immature Granulocytes: 1 %
Lymphocytes Absolute: 0.3 10*3/uL — ABNORMAL LOW (ref 0.7–3.1)
Lymphs: 8 %
MCH: 29.1 pg (ref 26.6–33.0)
MCHC: 32.9 g/dL (ref 31.5–35.7)
MCV: 88 fL (ref 79–97)
MONOS ABS: 1 10*3/uL — AB (ref 0.1–0.9)
Monocytes: 24 %
NEUTROS ABS: 2.7 10*3/uL (ref 1.4–7.0)
Neutrophils: 66 %
Platelets: 220 10*3/uL (ref 150–379)
RBC: 6.09 x10E6/uL — ABNORMAL HIGH (ref 4.14–5.80)
RDW: 15.4 % (ref 12.3–15.4)
WBC: 4 10*3/uL (ref 3.4–10.8)

## 2015-08-24 LAB — COMPREHENSIVE METABOLIC PANEL
A/G RATIO: 1.8 (ref 1.2–2.2)
ALBUMIN: 5.1 g/dL (ref 3.5–5.5)
ALT: 30 IU/L (ref 0–44)
AST: 35 IU/L (ref 0–40)
Alkaline Phosphatase: 119 IU/L — ABNORMAL HIGH (ref 39–117)
BILIRUBIN TOTAL: 0.6 mg/dL (ref 0.0–1.2)
BUN / CREAT RATIO: 14 (ref 9–20)
BUN: 15 mg/dL (ref 6–24)
CHLORIDE: 102 mmol/L (ref 96–106)
CO2: 22 mmol/L (ref 18–29)
Calcium: 11 mg/dL — ABNORMAL HIGH (ref 8.7–10.2)
Creatinine, Ser: 1.06 mg/dL (ref 0.76–1.27)
GFR calc non Af Amer: 79 mL/min/{1.73_m2} (ref >59)
GFR, EST AFRICAN AMERICAN: 92 mL/min/{1.73_m2} (ref >59)
Globulin, Total: 2.9 g/dL (ref 1.5–4.5)
Glucose: 96 mg/dL (ref 65–99)
POTASSIUM: 5.1 mmol/L (ref 3.5–5.2)
Sodium: 149 mmol/L — ABNORMAL HIGH (ref 134–144)
TOTAL PROTEIN: 8 g/dL (ref 6.0–8.5)

## 2015-09-17 DIAGNOSIS — E784 Other hyperlipidemia: Secondary | ICD-10-CM | POA: Diagnosis not present

## 2015-09-17 DIAGNOSIS — I10 Essential (primary) hypertension: Secondary | ICD-10-CM | POA: Diagnosis not present

## 2015-09-17 DIAGNOSIS — G35 Multiple sclerosis: Secondary | ICD-10-CM | POA: Diagnosis not present

## 2015-10-22 DIAGNOSIS — M6281 Muscle weakness (generalized): Secondary | ICD-10-CM | POA: Diagnosis not present

## 2015-10-22 DIAGNOSIS — G35 Multiple sclerosis: Secondary | ICD-10-CM | POA: Diagnosis not present

## 2015-10-23 DIAGNOSIS — M6281 Muscle weakness (generalized): Secondary | ICD-10-CM | POA: Diagnosis not present

## 2015-10-23 DIAGNOSIS — G35 Multiple sclerosis: Secondary | ICD-10-CM | POA: Diagnosis not present

## 2015-10-24 DIAGNOSIS — M6281 Muscle weakness (generalized): Secondary | ICD-10-CM | POA: Diagnosis not present

## 2015-10-24 DIAGNOSIS — G35 Multiple sclerosis: Secondary | ICD-10-CM | POA: Diagnosis not present

## 2015-10-24 DIAGNOSIS — Z23 Encounter for immunization: Secondary | ICD-10-CM | POA: Diagnosis not present

## 2015-10-25 DIAGNOSIS — M6281 Muscle weakness (generalized): Secondary | ICD-10-CM | POA: Diagnosis not present

## 2015-10-25 DIAGNOSIS — G35 Multiple sclerosis: Secondary | ICD-10-CM | POA: Diagnosis not present

## 2015-10-26 DIAGNOSIS — G35 Multiple sclerosis: Secondary | ICD-10-CM | POA: Diagnosis not present

## 2015-10-26 DIAGNOSIS — M6281 Muscle weakness (generalized): Secondary | ICD-10-CM | POA: Diagnosis not present

## 2015-10-29 DIAGNOSIS — G35 Multiple sclerosis: Secondary | ICD-10-CM | POA: Diagnosis not present

## 2015-10-29 DIAGNOSIS — M6281 Muscle weakness (generalized): Secondary | ICD-10-CM | POA: Diagnosis not present

## 2015-10-30 DIAGNOSIS — G35 Multiple sclerosis: Secondary | ICD-10-CM | POA: Diagnosis not present

## 2015-10-30 DIAGNOSIS — M6281 Muscle weakness (generalized): Secondary | ICD-10-CM | POA: Diagnosis not present

## 2015-10-31 DIAGNOSIS — I10 Essential (primary) hypertension: Secondary | ICD-10-CM | POA: Diagnosis not present

## 2015-10-31 DIAGNOSIS — G35 Multiple sclerosis: Secondary | ICD-10-CM | POA: Diagnosis not present

## 2015-10-31 DIAGNOSIS — E784 Other hyperlipidemia: Secondary | ICD-10-CM | POA: Diagnosis not present

## 2015-10-31 DIAGNOSIS — M6281 Muscle weakness (generalized): Secondary | ICD-10-CM | POA: Diagnosis not present

## 2015-11-01 DIAGNOSIS — M6281 Muscle weakness (generalized): Secondary | ICD-10-CM | POA: Diagnosis not present

## 2015-11-01 DIAGNOSIS — G35 Multiple sclerosis: Secondary | ICD-10-CM | POA: Diagnosis not present

## 2015-11-02 DIAGNOSIS — M6281 Muscle weakness (generalized): Secondary | ICD-10-CM | POA: Diagnosis not present

## 2015-11-02 DIAGNOSIS — G35 Multiple sclerosis: Secondary | ICD-10-CM | POA: Diagnosis not present

## 2015-11-05 DIAGNOSIS — M6281 Muscle weakness (generalized): Secondary | ICD-10-CM | POA: Diagnosis not present

## 2015-11-05 DIAGNOSIS — G35 Multiple sclerosis: Secondary | ICD-10-CM | POA: Diagnosis not present

## 2015-11-06 DIAGNOSIS — M6281 Muscle weakness (generalized): Secondary | ICD-10-CM | POA: Diagnosis not present

## 2015-11-06 DIAGNOSIS — G35 Multiple sclerosis: Secondary | ICD-10-CM | POA: Diagnosis not present

## 2015-11-07 DIAGNOSIS — G35 Multiple sclerosis: Secondary | ICD-10-CM | POA: Diagnosis not present

## 2015-11-07 DIAGNOSIS — M6281 Muscle weakness (generalized): Secondary | ICD-10-CM | POA: Diagnosis not present

## 2015-11-08 DIAGNOSIS — M6281 Muscle weakness (generalized): Secondary | ICD-10-CM | POA: Diagnosis not present

## 2015-11-08 DIAGNOSIS — G35 Multiple sclerosis: Secondary | ICD-10-CM | POA: Diagnosis not present

## 2015-11-09 DIAGNOSIS — M6281 Muscle weakness (generalized): Secondary | ICD-10-CM | POA: Diagnosis not present

## 2015-11-09 DIAGNOSIS — G35 Multiple sclerosis: Secondary | ICD-10-CM | POA: Diagnosis not present

## 2015-11-12 DIAGNOSIS — M6281 Muscle weakness (generalized): Secondary | ICD-10-CM | POA: Diagnosis not present

## 2015-11-12 DIAGNOSIS — G35 Multiple sclerosis: Secondary | ICD-10-CM | POA: Diagnosis not present

## 2015-11-13 DIAGNOSIS — M6281 Muscle weakness (generalized): Secondary | ICD-10-CM | POA: Diagnosis not present

## 2015-11-13 DIAGNOSIS — G35 Multiple sclerosis: Secondary | ICD-10-CM | POA: Diagnosis not present

## 2015-11-14 DIAGNOSIS — M6281 Muscle weakness (generalized): Secondary | ICD-10-CM | POA: Diagnosis not present

## 2015-11-14 DIAGNOSIS — G35 Multiple sclerosis: Secondary | ICD-10-CM | POA: Diagnosis not present

## 2015-11-15 DIAGNOSIS — G35 Multiple sclerosis: Secondary | ICD-10-CM | POA: Diagnosis not present

## 2015-11-15 DIAGNOSIS — M6281 Muscle weakness (generalized): Secondary | ICD-10-CM | POA: Diagnosis not present

## 2015-11-16 DIAGNOSIS — G35 Multiple sclerosis: Secondary | ICD-10-CM | POA: Diagnosis not present

## 2015-11-16 DIAGNOSIS — M6281 Muscle weakness (generalized): Secondary | ICD-10-CM | POA: Diagnosis not present

## 2015-11-19 DIAGNOSIS — M6281 Muscle weakness (generalized): Secondary | ICD-10-CM | POA: Diagnosis not present

## 2015-11-19 DIAGNOSIS — G35 Multiple sclerosis: Secondary | ICD-10-CM | POA: Diagnosis not present

## 2015-11-20 DIAGNOSIS — G35 Multiple sclerosis: Secondary | ICD-10-CM | POA: Diagnosis not present

## 2015-11-20 DIAGNOSIS — M6281 Muscle weakness (generalized): Secondary | ICD-10-CM | POA: Diagnosis not present

## 2015-11-21 DIAGNOSIS — G35 Multiple sclerosis: Secondary | ICD-10-CM | POA: Diagnosis not present

## 2015-11-21 DIAGNOSIS — M6281 Muscle weakness (generalized): Secondary | ICD-10-CM | POA: Diagnosis not present

## 2015-11-23 DIAGNOSIS — M6281 Muscle weakness (generalized): Secondary | ICD-10-CM | POA: Diagnosis not present

## 2015-11-23 DIAGNOSIS — G35 Multiple sclerosis: Secondary | ICD-10-CM | POA: Diagnosis not present

## 2015-11-26 DIAGNOSIS — M6281 Muscle weakness (generalized): Secondary | ICD-10-CM | POA: Diagnosis not present

## 2015-11-26 DIAGNOSIS — G35 Multiple sclerosis: Secondary | ICD-10-CM | POA: Diagnosis not present

## 2015-11-27 DIAGNOSIS — G35 Multiple sclerosis: Secondary | ICD-10-CM | POA: Diagnosis not present

## 2015-11-27 DIAGNOSIS — M6281 Muscle weakness (generalized): Secondary | ICD-10-CM | POA: Diagnosis not present

## 2015-11-28 DIAGNOSIS — M6281 Muscle weakness (generalized): Secondary | ICD-10-CM | POA: Diagnosis not present

## 2015-11-28 DIAGNOSIS — G35 Multiple sclerosis: Secondary | ICD-10-CM | POA: Diagnosis not present

## 2015-11-29 DIAGNOSIS — M6281 Muscle weakness (generalized): Secondary | ICD-10-CM | POA: Diagnosis not present

## 2015-11-29 DIAGNOSIS — G35 Multiple sclerosis: Secondary | ICD-10-CM | POA: Diagnosis not present

## 2015-11-30 DIAGNOSIS — M6281 Muscle weakness (generalized): Secondary | ICD-10-CM | POA: Diagnosis not present

## 2015-11-30 DIAGNOSIS — G35 Multiple sclerosis: Secondary | ICD-10-CM | POA: Diagnosis not present

## 2015-12-03 DIAGNOSIS — G35 Multiple sclerosis: Secondary | ICD-10-CM | POA: Diagnosis not present

## 2015-12-03 DIAGNOSIS — M6281 Muscle weakness (generalized): Secondary | ICD-10-CM | POA: Diagnosis not present

## 2015-12-04 DIAGNOSIS — M6281 Muscle weakness (generalized): Secondary | ICD-10-CM | POA: Diagnosis not present

## 2015-12-04 DIAGNOSIS — G35 Multiple sclerosis: Secondary | ICD-10-CM | POA: Diagnosis not present

## 2015-12-05 DIAGNOSIS — M6281 Muscle weakness (generalized): Secondary | ICD-10-CM | POA: Diagnosis not present

## 2015-12-05 DIAGNOSIS — G35 Multiple sclerosis: Secondary | ICD-10-CM | POA: Diagnosis not present

## 2015-12-06 DIAGNOSIS — M6281 Muscle weakness (generalized): Secondary | ICD-10-CM | POA: Diagnosis not present

## 2015-12-06 DIAGNOSIS — G35 Multiple sclerosis: Secondary | ICD-10-CM | POA: Diagnosis not present

## 2015-12-07 DIAGNOSIS — G35 Multiple sclerosis: Secondary | ICD-10-CM | POA: Diagnosis not present

## 2015-12-07 DIAGNOSIS — M6281 Muscle weakness (generalized): Secondary | ICD-10-CM | POA: Diagnosis not present

## 2015-12-10 DIAGNOSIS — G35 Multiple sclerosis: Secondary | ICD-10-CM | POA: Diagnosis not present

## 2015-12-10 DIAGNOSIS — M6281 Muscle weakness (generalized): Secondary | ICD-10-CM | POA: Diagnosis not present

## 2015-12-11 DIAGNOSIS — G35 Multiple sclerosis: Secondary | ICD-10-CM | POA: Diagnosis not present

## 2015-12-11 DIAGNOSIS — M6281 Muscle weakness (generalized): Secondary | ICD-10-CM | POA: Diagnosis not present

## 2015-12-12 DIAGNOSIS — M6281 Muscle weakness (generalized): Secondary | ICD-10-CM | POA: Diagnosis not present

## 2015-12-12 DIAGNOSIS — G35 Multiple sclerosis: Secondary | ICD-10-CM | POA: Diagnosis not present

## 2015-12-13 DIAGNOSIS — M6281 Muscle weakness (generalized): Secondary | ICD-10-CM | POA: Diagnosis not present

## 2015-12-13 DIAGNOSIS — G35 Multiple sclerosis: Secondary | ICD-10-CM | POA: Diagnosis not present

## 2015-12-14 DIAGNOSIS — R278 Other lack of coordination: Secondary | ICD-10-CM | POA: Diagnosis not present

## 2015-12-14 DIAGNOSIS — G35 Multiple sclerosis: Secondary | ICD-10-CM | POA: Diagnosis not present

## 2015-12-14 DIAGNOSIS — M6281 Muscle weakness (generalized): Secondary | ICD-10-CM | POA: Diagnosis not present

## 2015-12-16 DIAGNOSIS — G35 Multiple sclerosis: Secondary | ICD-10-CM | POA: Diagnosis not present

## 2015-12-16 DIAGNOSIS — E784 Other hyperlipidemia: Secondary | ICD-10-CM | POA: Diagnosis not present

## 2015-12-16 DIAGNOSIS — I10 Essential (primary) hypertension: Secondary | ICD-10-CM | POA: Diagnosis not present

## 2015-12-17 DIAGNOSIS — G35 Multiple sclerosis: Secondary | ICD-10-CM | POA: Diagnosis not present

## 2015-12-17 DIAGNOSIS — R278 Other lack of coordination: Secondary | ICD-10-CM | POA: Diagnosis not present

## 2015-12-17 DIAGNOSIS — M6281 Muscle weakness (generalized): Secondary | ICD-10-CM | POA: Diagnosis not present

## 2015-12-18 DIAGNOSIS — R278 Other lack of coordination: Secondary | ICD-10-CM | POA: Diagnosis not present

## 2015-12-18 DIAGNOSIS — G35 Multiple sclerosis: Secondary | ICD-10-CM | POA: Diagnosis not present

## 2015-12-18 DIAGNOSIS — M6281 Muscle weakness (generalized): Secondary | ICD-10-CM | POA: Diagnosis not present

## 2015-12-19 DIAGNOSIS — G35 Multiple sclerosis: Secondary | ICD-10-CM | POA: Diagnosis not present

## 2015-12-19 DIAGNOSIS — R278 Other lack of coordination: Secondary | ICD-10-CM | POA: Diagnosis not present

## 2015-12-19 DIAGNOSIS — M6281 Muscle weakness (generalized): Secondary | ICD-10-CM | POA: Diagnosis not present

## 2015-12-20 DIAGNOSIS — G35 Multiple sclerosis: Secondary | ICD-10-CM | POA: Diagnosis not present

## 2015-12-20 DIAGNOSIS — M6281 Muscle weakness (generalized): Secondary | ICD-10-CM | POA: Diagnosis not present

## 2015-12-20 DIAGNOSIS — R278 Other lack of coordination: Secondary | ICD-10-CM | POA: Diagnosis not present

## 2015-12-21 DIAGNOSIS — M6281 Muscle weakness (generalized): Secondary | ICD-10-CM | POA: Diagnosis not present

## 2015-12-21 DIAGNOSIS — G35 Multiple sclerosis: Secondary | ICD-10-CM | POA: Diagnosis not present

## 2015-12-21 DIAGNOSIS — R278 Other lack of coordination: Secondary | ICD-10-CM | POA: Diagnosis not present

## 2015-12-24 DIAGNOSIS — G35 Multiple sclerosis: Secondary | ICD-10-CM | POA: Diagnosis not present

## 2015-12-24 DIAGNOSIS — M6281 Muscle weakness (generalized): Secondary | ICD-10-CM | POA: Diagnosis not present

## 2015-12-24 DIAGNOSIS — R278 Other lack of coordination: Secondary | ICD-10-CM | POA: Diagnosis not present

## 2015-12-25 DIAGNOSIS — R278 Other lack of coordination: Secondary | ICD-10-CM | POA: Diagnosis not present

## 2015-12-25 DIAGNOSIS — G35 Multiple sclerosis: Secondary | ICD-10-CM | POA: Diagnosis not present

## 2015-12-25 DIAGNOSIS — M6281 Muscle weakness (generalized): Secondary | ICD-10-CM | POA: Diagnosis not present

## 2015-12-26 DIAGNOSIS — R278 Other lack of coordination: Secondary | ICD-10-CM | POA: Diagnosis not present

## 2015-12-26 DIAGNOSIS — M6281 Muscle weakness (generalized): Secondary | ICD-10-CM | POA: Diagnosis not present

## 2015-12-26 DIAGNOSIS — G35 Multiple sclerosis: Secondary | ICD-10-CM | POA: Diagnosis not present

## 2015-12-27 DIAGNOSIS — R278 Other lack of coordination: Secondary | ICD-10-CM | POA: Diagnosis not present

## 2015-12-27 DIAGNOSIS — G35 Multiple sclerosis: Secondary | ICD-10-CM | POA: Diagnosis not present

## 2015-12-27 DIAGNOSIS — M6281 Muscle weakness (generalized): Secondary | ICD-10-CM | POA: Diagnosis not present

## 2015-12-28 DIAGNOSIS — M6281 Muscle weakness (generalized): Secondary | ICD-10-CM | POA: Diagnosis not present

## 2015-12-28 DIAGNOSIS — G35 Multiple sclerosis: Secondary | ICD-10-CM | POA: Diagnosis not present

## 2015-12-28 DIAGNOSIS — R278 Other lack of coordination: Secondary | ICD-10-CM | POA: Diagnosis not present

## 2015-12-31 DIAGNOSIS — M6281 Muscle weakness (generalized): Secondary | ICD-10-CM | POA: Diagnosis not present

## 2015-12-31 DIAGNOSIS — G35 Multiple sclerosis: Secondary | ICD-10-CM | POA: Diagnosis not present

## 2015-12-31 DIAGNOSIS — R278 Other lack of coordination: Secondary | ICD-10-CM | POA: Diagnosis not present

## 2016-01-01 DIAGNOSIS — R278 Other lack of coordination: Secondary | ICD-10-CM | POA: Diagnosis not present

## 2016-01-01 DIAGNOSIS — M6281 Muscle weakness (generalized): Secondary | ICD-10-CM | POA: Diagnosis not present

## 2016-01-01 DIAGNOSIS — G35 Multiple sclerosis: Secondary | ICD-10-CM | POA: Diagnosis not present

## 2016-01-02 DIAGNOSIS — G35 Multiple sclerosis: Secondary | ICD-10-CM | POA: Diagnosis not present

## 2016-01-02 DIAGNOSIS — R278 Other lack of coordination: Secondary | ICD-10-CM | POA: Diagnosis not present

## 2016-01-02 DIAGNOSIS — M6281 Muscle weakness (generalized): Secondary | ICD-10-CM | POA: Diagnosis not present

## 2016-01-03 DIAGNOSIS — R278 Other lack of coordination: Secondary | ICD-10-CM | POA: Diagnosis not present

## 2016-01-03 DIAGNOSIS — M6281 Muscle weakness (generalized): Secondary | ICD-10-CM | POA: Diagnosis not present

## 2016-01-03 DIAGNOSIS — G35 Multiple sclerosis: Secondary | ICD-10-CM | POA: Diagnosis not present

## 2016-01-04 DIAGNOSIS — R278 Other lack of coordination: Secondary | ICD-10-CM | POA: Diagnosis not present

## 2016-01-04 DIAGNOSIS — M6281 Muscle weakness (generalized): Secondary | ICD-10-CM | POA: Diagnosis not present

## 2016-01-04 DIAGNOSIS — G35 Multiple sclerosis: Secondary | ICD-10-CM | POA: Diagnosis not present

## 2016-01-07 DIAGNOSIS — G35 Multiple sclerosis: Secondary | ICD-10-CM | POA: Diagnosis not present

## 2016-01-07 DIAGNOSIS — M6281 Muscle weakness (generalized): Secondary | ICD-10-CM | POA: Diagnosis not present

## 2016-01-07 DIAGNOSIS — R278 Other lack of coordination: Secondary | ICD-10-CM | POA: Diagnosis not present

## 2016-01-08 DIAGNOSIS — R278 Other lack of coordination: Secondary | ICD-10-CM | POA: Diagnosis not present

## 2016-01-08 DIAGNOSIS — M6281 Muscle weakness (generalized): Secondary | ICD-10-CM | POA: Diagnosis not present

## 2016-01-08 DIAGNOSIS — G35 Multiple sclerosis: Secondary | ICD-10-CM | POA: Diagnosis not present

## 2016-01-09 DIAGNOSIS — G35 Multiple sclerosis: Secondary | ICD-10-CM | POA: Diagnosis not present

## 2016-01-09 DIAGNOSIS — M6281 Muscle weakness (generalized): Secondary | ICD-10-CM | POA: Diagnosis not present

## 2016-01-09 DIAGNOSIS — R278 Other lack of coordination: Secondary | ICD-10-CM | POA: Diagnosis not present

## 2016-01-10 DIAGNOSIS — R278 Other lack of coordination: Secondary | ICD-10-CM | POA: Diagnosis not present

## 2016-01-10 DIAGNOSIS — G35 Multiple sclerosis: Secondary | ICD-10-CM | POA: Diagnosis not present

## 2016-01-10 DIAGNOSIS — M6281 Muscle weakness (generalized): Secondary | ICD-10-CM | POA: Diagnosis not present

## 2016-01-11 DIAGNOSIS — R278 Other lack of coordination: Secondary | ICD-10-CM | POA: Diagnosis not present

## 2016-01-11 DIAGNOSIS — G35 Multiple sclerosis: Secondary | ICD-10-CM | POA: Diagnosis not present

## 2016-01-11 DIAGNOSIS — M6281 Muscle weakness (generalized): Secondary | ICD-10-CM | POA: Diagnosis not present

## 2016-01-14 DIAGNOSIS — M6281 Muscle weakness (generalized): Secondary | ICD-10-CM | POA: Diagnosis not present

## 2016-01-14 DIAGNOSIS — G35 Multiple sclerosis: Secondary | ICD-10-CM | POA: Diagnosis not present

## 2016-01-14 DIAGNOSIS — R278 Other lack of coordination: Secondary | ICD-10-CM | POA: Diagnosis not present

## 2016-01-15 DIAGNOSIS — R278 Other lack of coordination: Secondary | ICD-10-CM | POA: Diagnosis not present

## 2016-01-15 DIAGNOSIS — M6281 Muscle weakness (generalized): Secondary | ICD-10-CM | POA: Diagnosis not present

## 2016-01-15 DIAGNOSIS — G35 Multiple sclerosis: Secondary | ICD-10-CM | POA: Diagnosis not present

## 2016-01-16 DIAGNOSIS — G35 Multiple sclerosis: Secondary | ICD-10-CM | POA: Diagnosis not present

## 2016-01-16 DIAGNOSIS — R278 Other lack of coordination: Secondary | ICD-10-CM | POA: Diagnosis not present

## 2016-01-16 DIAGNOSIS — M6281 Muscle weakness (generalized): Secondary | ICD-10-CM | POA: Diagnosis not present

## 2016-01-17 DIAGNOSIS — M6281 Muscle weakness (generalized): Secondary | ICD-10-CM | POA: Diagnosis not present

## 2016-01-17 DIAGNOSIS — G35 Multiple sclerosis: Secondary | ICD-10-CM | POA: Diagnosis not present

## 2016-01-17 DIAGNOSIS — R278 Other lack of coordination: Secondary | ICD-10-CM | POA: Diagnosis not present

## 2016-01-18 DIAGNOSIS — M6281 Muscle weakness (generalized): Secondary | ICD-10-CM | POA: Diagnosis not present

## 2016-01-18 DIAGNOSIS — G35 Multiple sclerosis: Secondary | ICD-10-CM | POA: Diagnosis not present

## 2016-01-18 DIAGNOSIS — R278 Other lack of coordination: Secondary | ICD-10-CM | POA: Diagnosis not present

## 2016-01-21 DIAGNOSIS — M6281 Muscle weakness (generalized): Secondary | ICD-10-CM | POA: Diagnosis not present

## 2016-01-21 DIAGNOSIS — R278 Other lack of coordination: Secondary | ICD-10-CM | POA: Diagnosis not present

## 2016-01-21 DIAGNOSIS — G35 Multiple sclerosis: Secondary | ICD-10-CM | POA: Diagnosis not present

## 2016-01-22 DIAGNOSIS — G35 Multiple sclerosis: Secondary | ICD-10-CM | POA: Diagnosis not present

## 2016-01-22 DIAGNOSIS — R278 Other lack of coordination: Secondary | ICD-10-CM | POA: Diagnosis not present

## 2016-01-22 DIAGNOSIS — M6281 Muscle weakness (generalized): Secondary | ICD-10-CM | POA: Diagnosis not present

## 2016-01-23 DIAGNOSIS — R278 Other lack of coordination: Secondary | ICD-10-CM | POA: Diagnosis not present

## 2016-01-23 DIAGNOSIS — G35 Multiple sclerosis: Secondary | ICD-10-CM | POA: Diagnosis not present

## 2016-01-23 DIAGNOSIS — M6281 Muscle weakness (generalized): Secondary | ICD-10-CM | POA: Diagnosis not present

## 2016-01-24 DIAGNOSIS — G35 Multiple sclerosis: Secondary | ICD-10-CM | POA: Diagnosis not present

## 2016-01-24 DIAGNOSIS — M6281 Muscle weakness (generalized): Secondary | ICD-10-CM | POA: Diagnosis not present

## 2016-01-24 DIAGNOSIS — R278 Other lack of coordination: Secondary | ICD-10-CM | POA: Diagnosis not present

## 2016-01-25 DIAGNOSIS — R278 Other lack of coordination: Secondary | ICD-10-CM | POA: Diagnosis not present

## 2016-01-25 DIAGNOSIS — M6281 Muscle weakness (generalized): Secondary | ICD-10-CM | POA: Diagnosis not present

## 2016-01-25 DIAGNOSIS — G35 Multiple sclerosis: Secondary | ICD-10-CM | POA: Diagnosis not present

## 2016-01-27 DIAGNOSIS — I10 Essential (primary) hypertension: Secondary | ICD-10-CM | POA: Diagnosis not present

## 2016-01-27 DIAGNOSIS — G35 Multiple sclerosis: Secondary | ICD-10-CM | POA: Diagnosis not present

## 2016-01-27 DIAGNOSIS — E784 Other hyperlipidemia: Secondary | ICD-10-CM | POA: Diagnosis not present

## 2016-01-28 DIAGNOSIS — M6281 Muscle weakness (generalized): Secondary | ICD-10-CM | POA: Diagnosis not present

## 2016-01-28 DIAGNOSIS — G35 Multiple sclerosis: Secondary | ICD-10-CM | POA: Diagnosis not present

## 2016-01-28 DIAGNOSIS — R278 Other lack of coordination: Secondary | ICD-10-CM | POA: Diagnosis not present

## 2016-01-29 DIAGNOSIS — M6281 Muscle weakness (generalized): Secondary | ICD-10-CM | POA: Diagnosis not present

## 2016-01-29 DIAGNOSIS — G35 Multiple sclerosis: Secondary | ICD-10-CM | POA: Diagnosis not present

## 2016-01-29 DIAGNOSIS — R278 Other lack of coordination: Secondary | ICD-10-CM | POA: Diagnosis not present

## 2016-01-31 DIAGNOSIS — M6281 Muscle weakness (generalized): Secondary | ICD-10-CM | POA: Diagnosis not present

## 2016-01-31 DIAGNOSIS — R278 Other lack of coordination: Secondary | ICD-10-CM | POA: Diagnosis not present

## 2016-01-31 DIAGNOSIS — G35 Multiple sclerosis: Secondary | ICD-10-CM | POA: Diagnosis not present

## 2016-02-01 DIAGNOSIS — G35 Multiple sclerosis: Secondary | ICD-10-CM | POA: Diagnosis not present

## 2016-02-01 DIAGNOSIS — R278 Other lack of coordination: Secondary | ICD-10-CM | POA: Diagnosis not present

## 2016-02-01 DIAGNOSIS — M6281 Muscle weakness (generalized): Secondary | ICD-10-CM | POA: Diagnosis not present

## 2016-02-04 DIAGNOSIS — M6281 Muscle weakness (generalized): Secondary | ICD-10-CM | POA: Diagnosis not present

## 2016-02-04 DIAGNOSIS — G35 Multiple sclerosis: Secondary | ICD-10-CM | POA: Diagnosis not present

## 2016-02-04 DIAGNOSIS — R278 Other lack of coordination: Secondary | ICD-10-CM | POA: Diagnosis not present

## 2016-02-05 DIAGNOSIS — R278 Other lack of coordination: Secondary | ICD-10-CM | POA: Diagnosis not present

## 2016-02-05 DIAGNOSIS — G35 Multiple sclerosis: Secondary | ICD-10-CM | POA: Diagnosis not present

## 2016-02-05 DIAGNOSIS — M6281 Muscle weakness (generalized): Secondary | ICD-10-CM | POA: Diagnosis not present

## 2016-02-06 DIAGNOSIS — M6281 Muscle weakness (generalized): Secondary | ICD-10-CM | POA: Diagnosis not present

## 2016-02-06 DIAGNOSIS — R278 Other lack of coordination: Secondary | ICD-10-CM | POA: Diagnosis not present

## 2016-02-06 DIAGNOSIS — G35 Multiple sclerosis: Secondary | ICD-10-CM | POA: Diagnosis not present

## 2016-02-07 DIAGNOSIS — M6281 Muscle weakness (generalized): Secondary | ICD-10-CM | POA: Diagnosis not present

## 2016-02-07 DIAGNOSIS — R278 Other lack of coordination: Secondary | ICD-10-CM | POA: Diagnosis not present

## 2016-02-07 DIAGNOSIS — G35 Multiple sclerosis: Secondary | ICD-10-CM | POA: Diagnosis not present

## 2016-02-26 ENCOUNTER — Encounter: Payer: Self-pay | Admitting: Neurology

## 2016-02-26 ENCOUNTER — Ambulatory Visit (INDEPENDENT_AMBULATORY_CARE_PROVIDER_SITE_OTHER): Payer: Medicare Other | Admitting: Neurology

## 2016-02-26 VITALS — BP 142/80 | HR 68 | Resp 18 | Ht 74.0 in | Wt 208.0 lb

## 2016-02-26 DIAGNOSIS — F09 Unspecified mental disorder due to known physiological condition: Secondary | ICD-10-CM | POA: Diagnosis not present

## 2016-02-26 DIAGNOSIS — R5383 Other fatigue: Secondary | ICD-10-CM | POA: Diagnosis not present

## 2016-02-26 DIAGNOSIS — R251 Tremor, unspecified: Secondary | ICD-10-CM

## 2016-02-26 DIAGNOSIS — F329 Major depressive disorder, single episode, unspecified: Secondary | ICD-10-CM

## 2016-02-26 DIAGNOSIS — G35 Multiple sclerosis: Secondary | ICD-10-CM

## 2016-02-26 DIAGNOSIS — F32A Depression, unspecified: Secondary | ICD-10-CM

## 2016-02-26 DIAGNOSIS — N3941 Urge incontinence: Secondary | ICD-10-CM

## 2016-02-26 NOTE — Progress Notes (Signed)
[facility]  PATIENT: Mark Kramer DOB: 14-Oct-1960  REFERRING DOCTOR OR PCP:  Stoney Bang SOURCE: records and imaging reports and labs from [facility]  _________________________________   HISTORICAL  CHIEF COMPLAINT:  Chief Complaint  Patient presents with  . Multiple Sclerosis    Pt. is a resident at [facility]. facility.  Here with transportation today.  MAR indicates he continues to take Gilenya.  Also taking Primidone 50mg  qam and 100mg  qhs, and Nadolol 80mg  bid.  Pt. sts. he believes tremor may be some better.  Sts. recently he has been having right sided jaw/tooth pain.  Caregivers with him say he has been to the dentist, had x-rays which are ok, so dentist believes this may just be tooth sensitivity and has rec. Sensodyne toothpast/fim    HISTORY OF PRESENT ILLNESS:  Mark Kramer is a 56 year old man with multiple sclerosis who is currently a resident at the [facility] in [place].   MS:   He has a relapsing form of secondary progressive MS and has mostly been stable over the past 6 months.   Initially,  he was on Rebif and was switched to [place] in 2014.    He tolerates Gilenya well.      He was diagnosed with MS about 2001.  He presented with a reduced gait and had multiple exacerbations his first few years.   Few years later, he was using a cane and since 2010 or so has been wheelchair-bound.   Gait/strength/sensation:   He is unable to walk and uses a wheelchair mostly.  He is able to wheel himself 100 feet and takes self to activities and meals.   He is unable to transfer independently but can bear some weight to assist with transfers..   His left leg is mildly weaker and more spastic.    He also has severe ataxia and tremor, left worse than right.   The tremor is high amplitude, worse on his left      He denies any significant numbness or dysesthesia.    He takes nadolol and primidone for the tremor with some benefit  Bladder/Bowel:    He has had bladder issues for many years. He is unable to void voluntarily and uses Depends.     Vision:   He has not had any MS related vision problem.  No diplopia.   He wears corrective lenses.    Fatigue/sleep:   He denies fatigue.  He has less insomnia that he used to. He is, now sleeping from 830 pm to 530 am.  In the past, he had delayed sleep phase disorder and was up to 2-3 am most nights.         Mood/cognition:  He notes more depression and gets frustrated easily.   He is on Lexapro and clonazepam and tolerates that well.   He continues to have  cognitive dysfunction but this appears to be stable.  He has had right jaw pain.   He has seen dentistry and no source was found.   A numbing lotion was prescribed.      REVIEW OF SYSTEMS: Constitutional: No fevers, chills, sweats, or change in appetite.   Eyes: No visual changes, double vision, eye pain Ear, nose and throat: No hearing loss, ear pain, nasal congestion, sore throat.   He has right jaw pain Cardiovascular: No chest pain, palpitations Respiratory: No shortness of breath at rest or with exertion.   No wheezes GastrointestinaI: No nausea, vomiting, diarrhea, abdominal  pain, fecal incontinence Genitourinary: as above Musculoskeletal: No neck pain, back pain Integumentary: No rash, pruritus, skin lesions Neurological: as above Psychiatric: SOme depression at this time.  No anxiety Endocrine: No palpitations, diaphoresis, change in appetite, change in weigh or increased thirst Hematologic/Lymphatic: No anemia, purpura, petechiae. Allergic/Immunologic: No itchy/runny eyes, nasal congestion, recent allergic reactions, rashes  ALLERGIES: No Known Allergies  HOME MEDICATIONS:  Current Outpatient Prescriptions:  .  acetaminophen (TYLENOL) 650 MG CR tablet, Take 650 mg by mouth every 8 (eight) hours as needed for pain., Disp: , Rfl:  .  amLODipine (NORVASC) 10 MG tablet, Take 1 tablet (10 mg total) by mouth daily., Disp:  30 tablet, Rfl: 2 .  amphetamine-dextroamphetamine (ADDERALL) 20 MG tablet, Take 20 mg by mouth daily., Disp: , Rfl:  .  atorvastatin (LIPITOR) 80 MG tablet, Take 80 mg by mouth daily., Disp: , Rfl:  .  benzocaine-menthol (CHLORASEPTIC) 6-10 MG lozenge, Take 1 lozenge by mouth as needed for sore throat., Disp: , Rfl:  .  calcium-vitamin D (OSCAL WITH D) 500-200 MG-UNIT tablet, Take 1 tablet by mouth., Disp: , Rfl:  .  cholecalciferol (VITAMIN D) 1000 UNITS tablet, Take 2,000 Units by mouth daily., Disp: , Rfl:  .  escitalopram (LEXAPRO) 20 MG tablet, Take 20 mg by mouth daily. Reported on 02/01/2015, Disp: , Rfl:  .  Fingolimod HCl (GILENYA) 0.5 MG CAPS, Take 1 capsule by mouth daily. For Multiple Sclerosis, Disp: , Rfl:  .  furosemide (LASIX) 20 MG tablet, Take 20 mg by mouth daily., Disp: , Rfl:  .  hydroxypropyl methylcellulose / hypromellose (ISOPTO TEARS / GONIOVISC) 2.5 % ophthalmic solution, Place 1 drop into the right eye., Disp: , Rfl:  .  ibuprofen (ADVIL,MOTRIN) 800 MG tablet, , Disp: , Rfl:  .  levothyroxine (SYNTHROID, LEVOTHROID) 112 MCG tablet, Take 112 mcg by mouth daily., Disp: , Rfl:  .  Multiple Vitamin (MULTIVITAMIN) capsule, Take 1 capsule by mouth daily., Disp: , Rfl:  .  nadolol (CORGARD) 80 MG tablet, Take 80 mg by mouth 2 (two) times daily. , Disp: , Rfl:  .  polyethylene glycol powder (GLYCOLAX/MIRALAX) powder, , Disp: , Rfl:  .  primidone (MYSOLINE) 50 MG tablet, Take 50 mg by mouth 2 (two) times daily., Disp: , Rfl:  .  imipramine (TOFRANIL) 25 MG tablet, Take 50 mg by mouth at bedtime. Reported on 02/01/2015, Disp: , Rfl:   PAST MEDICAL HISTORY: Past Medical History:  Diagnosis Date  . Grave's disease   . High cholesterol   . Multiple sclerosis ([place])     PAST SURGICAL HISTORY: Past Surgical History:  Procedure Laterality Date  . APPENDECTOMY      FAMILY HISTORY: No family history on file.  SOCIAL HISTORY:  Social History   Social History  . Marital  status: Divorced    Spouse name: N/A  . Number of children: N/A  . Years of education: N/A   Occupational History  . Not on file.   Social History Main Topics  . Smoking status: Never Smoker  . Smokeless tobacco: Never Used  . Alcohol use No  . Drug use: No  . Sexual activity: Not on file   Other Topics Concern  . Not on file   Social History Narrative  . No narrative on file     PHYSICAL EXAM  Vitals:   02/26/16 0929  BP: (!) 142/80  Pulse: 68  Resp: 18  Weight: 208 lb (94.3 kg)  Height: 6\' 2"  (1.88 m)  Body mass index is 26.71 kg/m.   General: The patient is well-developed and well-nourished and in no acute distress.   He has mild tenderness to deep palpation of right external jaw   Neurologic Exam  Mental status: The patient is alert and oriented x 3 at the time of the examination. The patient has apparent normal recent and remote memory, with a reduced attention span and concentration ability.   Speech is normal.  Cranial nerves: Extraocular movements are full.  There is mildly reduced right facial sensation to soft touch .Facial strength is normal.  Trapezius and sternocleidomastoid strength is normal. No dysarthria is noted.  The tongue is midline, and the patient has symmetric elevation of the soft palate. No obvious hearing deficits are noted.  Motor:  There is a mild rest tremor and severe intention tremor, L > R  Muscle bulk is normal.   Tone is increased in the legs, left worse than right. Strength is 5/5 in arms and 2-/5 in legs  Sensory: Sensory testing is intact to pinprick, soft touch and vibration sensation in all 4 extremities.  Coordination: Cerebellar testing shows poor finger-nose-fingereft, worse on left.  Gait and station: He is unable to stand or walk.   Reflexes: Deep tendon reflexes are normal in the arms but increase in the legs bilaterally with spread at the knees.     DIAGNOSTIC DATA (LABS, IMAGING, TESTING) - I reviewed patient  records, labs, notes, testing and imaging myself where available.  Lab Results  Component Value Date   WBC 4.0 08/23/2015   HGB 16.2 04/09/2012   HCT 53.8 (H) 08/23/2015   MCV 88 08/23/2015   PLT 220 08/23/2015      Component Value Date/Time   NA 149 (H) 08/23/2015 1045   K 5.1 08/23/2015 1045   CL 102 08/23/2015 1045   CO2 22 08/23/2015 1045   GLUCOSE 96 08/23/2015 1045   GLUCOSE 75 04/09/2012 0745   BUN 15 08/23/2015 1045   CREATININE 1.06 08/23/2015 1045   CALCIUM 11.0 (H) 08/23/2015 1045   PROT 8.0 08/23/2015 1045   ALBUMIN 5.1 08/23/2015 1045   AST 35 08/23/2015 1045   ALT 30 08/23/2015 1045   ALKPHOS 119 (H) 08/23/2015 1045   BILITOT 0.6 08/23/2015 1045   GFRNONAA 79 08/23/2015 1045   GFRAA 92 08/23/2015 1045   No results found for: CHOL, HDL, LDLCALC, LDLDIRECT, TRIG, CHOLHDL No results found for: HGBA1C No results found for: VITAMINB12 Lab Results  Component Value Date   TSH 2.290 02/01/2015       ASSESSMENT AND PLAN  Multiple sclerosis ([place])  Cognitive dysfunction  Tremor  Other fatigue  Urge incontinence  Depression, unspecified depression type   1.  Continue Gilenya for MS.  2.   Continue primidone and nadolol for tremor  3.   Stop the cymbalta and start duloxetine 60 mg daily. Also add additional Adderall at lunch 4.   Return to see me in 6 months or sooner if there are new or worsening neurologic symptoms.   Pradyun A. Felecia Shelling, MD, PhD 9/73/5329, 92:42 AM Certified in Neurology, Clinical Neurophysiology, Sleep Medicine, Pain Medicine and Neuroimaging  [facility] [address] [place], [place] [address] [phone]

## 2016-03-03 DIAGNOSIS — I70203 Unspecified atherosclerosis of native arteries of extremities, bilateral legs: Secondary | ICD-10-CM | POA: Diagnosis not present

## 2016-03-03 DIAGNOSIS — B351 Tinea unguium: Secondary | ICD-10-CM | POA: Diagnosis not present

## 2016-03-16 DIAGNOSIS — E784 Other hyperlipidemia: Secondary | ICD-10-CM | POA: Diagnosis not present

## 2016-03-16 DIAGNOSIS — I10 Essential (primary) hypertension: Secondary | ICD-10-CM | POA: Diagnosis not present

## 2016-03-16 DIAGNOSIS — G35 Multiple sclerosis: Secondary | ICD-10-CM | POA: Diagnosis not present

## 2016-05-02 DIAGNOSIS — E784 Other hyperlipidemia: Secondary | ICD-10-CM | POA: Diagnosis not present

## 2016-05-02 DIAGNOSIS — G35 Multiple sclerosis: Secondary | ICD-10-CM | POA: Diagnosis not present

## 2016-05-02 DIAGNOSIS — I10 Essential (primary) hypertension: Secondary | ICD-10-CM | POA: Diagnosis not present

## 2016-05-13 DIAGNOSIS — Z79899 Other long term (current) drug therapy: Secondary | ICD-10-CM | POA: Diagnosis not present

## 2016-05-13 DIAGNOSIS — Z5181 Encounter for therapeutic drug level monitoring: Secondary | ICD-10-CM | POA: Diagnosis not present

## 2016-06-11 DIAGNOSIS — G35 Multiple sclerosis: Secondary | ICD-10-CM | POA: Diagnosis not present

## 2016-06-11 DIAGNOSIS — I10 Essential (primary) hypertension: Secondary | ICD-10-CM | POA: Diagnosis not present

## 2016-06-11 DIAGNOSIS — E784 Other hyperlipidemia: Secondary | ICD-10-CM | POA: Diagnosis not present

## 2016-07-16 DIAGNOSIS — E784 Other hyperlipidemia: Secondary | ICD-10-CM | POA: Diagnosis not present

## 2016-07-16 DIAGNOSIS — I10 Essential (primary) hypertension: Secondary | ICD-10-CM | POA: Diagnosis not present

## 2016-07-16 DIAGNOSIS — G35 Multiple sclerosis: Secondary | ICD-10-CM | POA: Diagnosis not present

## 2016-07-30 DIAGNOSIS — I10 Essential (primary) hypertension: Secondary | ICD-10-CM | POA: Insufficient documentation

## 2016-07-30 DIAGNOSIS — E785 Hyperlipidemia, unspecified: Secondary | ICD-10-CM | POA: Insufficient documentation

## 2016-07-30 DIAGNOSIS — M6281 Muscle weakness (generalized): Secondary | ICD-10-CM | POA: Insufficient documentation

## 2016-08-25 ENCOUNTER — Ambulatory Visit: Payer: Medicare Other | Admitting: Neurology

## 2016-08-30 DIAGNOSIS — G35 Multiple sclerosis: Secondary | ICD-10-CM | POA: Diagnosis not present

## 2016-08-30 DIAGNOSIS — E784 Other hyperlipidemia: Secondary | ICD-10-CM | POA: Diagnosis not present

## 2016-08-30 DIAGNOSIS — I10 Essential (primary) hypertension: Secondary | ICD-10-CM | POA: Diagnosis not present

## 2016-09-03 ENCOUNTER — Ambulatory Visit: Payer: Medicare Other | Admitting: Neurology

## 2016-09-11 DIAGNOSIS — F909 Attention-deficit hyperactivity disorder, unspecified type: Secondary | ICD-10-CM | POA: Diagnosis not present

## 2016-09-11 DIAGNOSIS — F329 Major depressive disorder, single episode, unspecified: Secondary | ICD-10-CM | POA: Diagnosis not present

## 2016-09-11 DIAGNOSIS — G250 Essential tremor: Secondary | ICD-10-CM | POA: Diagnosis not present

## 2016-09-11 DIAGNOSIS — G35 Multiple sclerosis: Secondary | ICD-10-CM | POA: Diagnosis not present

## 2016-09-18 ENCOUNTER — Ambulatory Visit: Payer: Medicare Other | Admitting: Neurology

## 2016-09-19 ENCOUNTER — Encounter: Payer: Self-pay | Admitting: Neurology

## 2016-09-22 DIAGNOSIS — R509 Fever, unspecified: Secondary | ICD-10-CM | POA: Diagnosis not present

## 2016-10-01 ENCOUNTER — Emergency Department (HOSPITAL_COMMUNITY): Payer: Medicare Other

## 2016-10-01 ENCOUNTER — Inpatient Hospital Stay (HOSPITAL_COMMUNITY)
Admission: EM | Admit: 2016-10-01 | Discharge: 2016-10-08 | DRG: 871 | Disposition: A | Discharge status: Skilled Nursing Facility | Payer: Medicare Other | Attending: Internal Medicine | Admitting: Internal Medicine

## 2016-10-01 ENCOUNTER — Encounter (HOSPITAL_COMMUNITY): Payer: Self-pay | Admitting: *Deleted

## 2016-10-01 DIAGNOSIS — J810 Acute pulmonary edema: Secondary | ICD-10-CM | POA: Diagnosis present

## 2016-10-01 DIAGNOSIS — E785 Hyperlipidemia, unspecified: Secondary | ICD-10-CM | POA: Diagnosis present

## 2016-10-01 DIAGNOSIS — Z79899 Other long term (current) drug therapy: Secondary | ICD-10-CM

## 2016-10-01 DIAGNOSIS — A419 Sepsis, unspecified organism: Secondary | ICD-10-CM | POA: Diagnosis not present

## 2016-10-01 DIAGNOSIS — G35 Multiple sclerosis: Secondary | ICD-10-CM | POA: Diagnosis not present

## 2016-10-01 DIAGNOSIS — R918 Other nonspecific abnormal finding of lung field: Secondary | ICD-10-CM | POA: Diagnosis not present

## 2016-10-01 DIAGNOSIS — E0500 Thyrotoxicosis with diffuse goiter without thyrotoxic crisis or storm: Secondary | ICD-10-CM | POA: Diagnosis present

## 2016-10-01 DIAGNOSIS — R6520 Severe sepsis without septic shock: Secondary | ICD-10-CM | POA: Diagnosis present

## 2016-10-01 DIAGNOSIS — Z7401 Bed confinement status: Secondary | ICD-10-CM | POA: Diagnosis not present

## 2016-10-01 DIAGNOSIS — G250 Essential tremor: Secondary | ICD-10-CM | POA: Diagnosis not present

## 2016-10-01 DIAGNOSIS — Z993 Dependence on wheelchair: Secondary | ICD-10-CM

## 2016-10-01 DIAGNOSIS — J9601 Acute respiratory failure with hypoxia: Secondary | ICD-10-CM | POA: Diagnosis not present

## 2016-10-01 DIAGNOSIS — L8990 Pressure ulcer of unspecified site, unspecified stage: Secondary | ICD-10-CM | POA: Diagnosis present

## 2016-10-01 DIAGNOSIS — G9341 Metabolic encephalopathy: Secondary | ICD-10-CM | POA: Diagnosis not present

## 2016-10-01 DIAGNOSIS — R1310 Dysphagia, unspecified: Secondary | ICD-10-CM

## 2016-10-01 DIAGNOSIS — R279 Unspecified lack of coordination: Secondary | ICD-10-CM | POA: Diagnosis not present

## 2016-10-01 DIAGNOSIS — Z931 Gastrostomy status: Secondary | ICD-10-CM | POA: Diagnosis not present

## 2016-10-01 DIAGNOSIS — R410 Disorientation, unspecified: Secondary | ICD-10-CM | POA: Diagnosis not present

## 2016-10-01 DIAGNOSIS — I10 Essential (primary) hypertension: Secondary | ICD-10-CM | POA: Diagnosis present

## 2016-10-01 DIAGNOSIS — E039 Hypothyroidism, unspecified: Secondary | ICD-10-CM | POA: Diagnosis present

## 2016-10-01 DIAGNOSIS — R52 Pain, unspecified: Secondary | ICD-10-CM | POA: Diagnosis not present

## 2016-10-01 DIAGNOSIS — R293 Abnormal posture: Secondary | ICD-10-CM | POA: Diagnosis not present

## 2016-10-01 DIAGNOSIS — R509 Fever, unspecified: Secondary | ICD-10-CM | POA: Diagnosis not present

## 2016-10-01 DIAGNOSIS — J189 Pneumonia, unspecified organism: Secondary | ICD-10-CM | POA: Diagnosis not present

## 2016-10-01 DIAGNOSIS — L89311 Pressure ulcer of right buttock, stage 1: Secondary | ICD-10-CM | POA: Diagnosis present

## 2016-10-01 DIAGNOSIS — R609 Edema, unspecified: Secondary | ICD-10-CM | POA: Diagnosis not present

## 2016-10-01 DIAGNOSIS — F3340 Major depressive disorder, recurrent, in remission, unspecified: Secondary | ICD-10-CM | POA: Diagnosis not present

## 2016-10-01 DIAGNOSIS — G252 Other specified forms of tremor: Secondary | ICD-10-CM | POA: Diagnosis present

## 2016-10-01 DIAGNOSIS — R251 Tremor, unspecified: Secondary | ICD-10-CM | POA: Diagnosis present

## 2016-10-01 DIAGNOSIS — F909 Attention-deficit hyperactivity disorder, unspecified type: Secondary | ICD-10-CM | POA: Diagnosis not present

## 2016-10-01 DIAGNOSIS — M6281 Muscle weakness (generalized): Secondary | ICD-10-CM | POA: Diagnosis not present

## 2016-10-01 DIAGNOSIS — R569 Unspecified convulsions: Secondary | ICD-10-CM | POA: Diagnosis not present

## 2016-10-01 DIAGNOSIS — F09 Unspecified mental disorder due to known physiological condition: Secondary | ICD-10-CM

## 2016-10-01 DIAGNOSIS — R451 Restlessness and agitation: Secondary | ICD-10-CM | POA: Diagnosis not present

## 2016-10-01 DIAGNOSIS — J9690 Respiratory failure, unspecified, unspecified whether with hypoxia or hypercapnia: Secondary | ICD-10-CM | POA: Diagnosis not present

## 2016-10-01 DIAGNOSIS — R0602 Shortness of breath: Secondary | ICD-10-CM | POA: Diagnosis not present

## 2016-10-01 DIAGNOSIS — R1312 Dysphagia, oropharyngeal phase: Secondary | ICD-10-CM | POA: Diagnosis not present

## 2016-10-01 DIAGNOSIS — R4182 Altered mental status, unspecified: Secondary | ICD-10-CM | POA: Diagnosis not present

## 2016-10-01 DIAGNOSIS — F419 Anxiety disorder, unspecified: Secondary | ICD-10-CM | POA: Diagnosis not present

## 2016-10-01 LAB — COMPREHENSIVE METABOLIC PANEL
ALBUMIN: 3.6 g/dL (ref 3.5–5.0)
ALK PHOS: 122 U/L (ref 38–126)
ALT: 27 U/L (ref 17–63)
AST: 54 U/L — AB (ref 15–41)
Anion gap: 10 (ref 5–15)
BUN: 18 mg/dL (ref 6–20)
CALCIUM: 9.4 mg/dL (ref 8.9–10.3)
CO2: 28 mmol/L (ref 22–32)
Chloride: 102 mmol/L (ref 101–111)
Creatinine, Ser: 0.89 mg/dL (ref 0.61–1.24)
GFR calc Af Amer: >60 mL/min (ref >60)
GFR calc non Af Amer: >60 mL/min (ref >60)
GLUCOSE: 114 mg/dL — AB (ref 65–99)
Potassium: 4.1 mmol/L (ref 3.5–5.1)
SODIUM: 140 mmol/L (ref 135–145)
Total Bilirubin: 1.1 mg/dL (ref 0.3–1.2)
Total Protein: 7.6 g/dL (ref 6.5–8.1)

## 2016-10-01 LAB — CBC WITH DIFFERENTIAL/PLATELET
BASOS PCT: 0 %
Basophils Absolute: 0 10*3/uL (ref 0.0–0.1)
Eosinophils Absolute: 0 10*3/uL (ref 0.0–0.7)
Eosinophils Relative: 0 %
HCT: 48.9 % (ref 39.0–52.0)
Hemoglobin: 16 g/dL (ref 13.0–17.0)
LYMPHS ABS: 0.4 10*3/uL — AB (ref 0.7–4.0)
Lymphocytes Relative: 4 %
MCH: 28.1 pg (ref 26.0–34.0)
MCHC: 32.7 g/dL (ref 30.0–36.0)
MCV: 85.9 fL (ref 78.0–100.0)
MONOS PCT: 4 %
Monocytes Absolute: 0.4 10*3/uL (ref 0.1–1.0)
Neutro Abs: 9.6 10*3/uL — ABNORMAL HIGH (ref 1.7–7.7)
Neutrophils Relative %: 92 %
Platelets: 207 10*3/uL (ref 150–400)
RBC: 5.69 MIL/uL (ref 4.22–5.81)
RDW: 14.2 % (ref 11.5–15.5)
WBC: 10.4 10*3/uL (ref 4.0–10.5)

## 2016-10-01 LAB — BLOOD GAS, ARTERIAL
Acid-Base Excess: 0.1 mmol/L (ref 0.0–2.0)
Bicarbonate: 24.7 mmol/L (ref 20.0–28.0)
Drawn by: 23534
O2 Content: 3.5 L/min
O2 SAT: 92.1 %
PCO2 ART: 36.3 mmHg (ref 32.0–48.0)
PH ART: 7.434 (ref 7.350–7.450)
PO2 ART: 68.2 mmHg — AB (ref 83.0–108.0)

## 2016-10-01 LAB — URINALYSIS, ROUTINE W REFLEX MICROSCOPIC
BILIRUBIN URINE: NEGATIVE
Bacteria, UA: NONE SEEN
GLUCOSE, UA: NEGATIVE mg/dL
Ketones, ur: NEGATIVE mg/dL
Leukocytes, UA: NEGATIVE
NITRITE: POSITIVE — AB
Protein, ur: NEGATIVE mg/dL
SPECIFIC GRAVITY, URINE: 1.011 (ref 1.005–1.030)
Squamous Epithelial / LPF: NONE SEEN
pH: 6 (ref 5.0–8.0)

## 2016-10-01 LAB — CBG MONITORING, ED: Glucose-Capillary: 111 mg/dL — ABNORMAL HIGH (ref 65–99)

## 2016-10-01 LAB — INFLUENZA PANEL BY PCR (TYPE A & B)
INFLAPCR: NEGATIVE
INFLBPCR: NEGATIVE

## 2016-10-01 LAB — I-STAT CG4 LACTIC ACID, ED
LACTIC ACID, VENOUS: 0.92 mmol/L (ref 0.5–1.9)
Lactic Acid, Venous: 1.8 mmol/L (ref 0.5–1.9)

## 2016-10-01 LAB — TROPONIN I

## 2016-10-01 MED ORDER — DEXTROSE 5 % IV SOLN
500.0000 mg | Freq: Once | INTRAVENOUS | Status: AC
  Administered 2016-10-01: 500 mg via INTRAVENOUS
  Filled 2016-10-01: qty 500

## 2016-10-01 MED ORDER — SODIUM CHLORIDE 0.9 % IV BOLUS (SEPSIS)
1000.0000 mL | Freq: Once | INTRAVENOUS | Status: AC
  Administered 2016-10-01: 1000 mL via INTRAVENOUS

## 2016-10-01 MED ORDER — ACETAMINOPHEN 650 MG RE SUPP
650.0000 mg | Freq: Once | RECTAL | Status: AC
  Administered 2016-10-01: 650 mg via RECTAL
  Filled 2016-10-01: qty 1

## 2016-10-01 MED ORDER — DEXTROSE 5 % IV SOLN
1.0000 g | Freq: Once | INTRAVENOUS | Status: AC
  Administered 2016-10-01: 1 g via INTRAVENOUS
  Filled 2016-10-01: qty 10

## 2016-10-01 NOTE — ED Notes (Signed)
Attempt made for 2nd IV in right hand

## 2016-10-01 NOTE — ED Provider Notes (Signed)
[facility] DEPT Provider Note   CSN: [id number] Arrival date & time: 10/01/16  0908     History   Chief Complaint No chief complaint on file.   HPI Mark Kramer is a 56 y.o. male.  Pt presents to the ED from SNF with AMS, hypoxia, and fever.  The pt is normally awake and alert.  He has severe MS, so he's in a SNF.  This morning, nursing staff found him not responding well.  It is unclear when he was last seen normal.  They checked his temp and it was 102.  They called EMS and sent him here.  EMS said RA O2 sats in the mid 80s.  He was placed on 4L via [place].  Pt is unable to contribute to the history.         Past Medical History:  Diagnosis Date  . Grave's disease   . High cholesterol   . Multiple sclerosis [facility])     Patient Active Problem List   Diagnosis Date Noted  . Sepsis ([place]) 10/01/2016  . Tremor 07/26/2014  . Other fatigue 07/26/2014  . Urge incontinence 07/26/2014  . Cognitive dysfunction 07/26/2014  . Depression 07/26/2014  . Multiple sclerosis ([place]) 04/09/2012  . Graves disease 04/09/2012  . Dyslipidemia 04/09/2012  . Altered mental status 04/09/2012  . UTI (urinary tract infection) 04/09/2012    Past Surgical History:  Procedure Laterality Date  . APPENDECTOMY         Home Medications    Prior to Admission medications   Medication Sig Start Date End Date Taking? Authorizing Provider  acetaminophen (TYLENOL) 650 MG CR tablet Take 650 mg by mouth every 8 (eight) hours as needed for pain.   Yes [provider]  ALPRAZolam (XANAX) 0.25 MG tablet Take 0.25 mg by mouth daily.   Yes [provider]  amLODipine (NORVASC) 10 MG tablet Take 1 tablet (10 mg total) by mouth daily. 04/13/12  Yes Oswald Hillock, MD  atorvastatin (LIPITOR) 80 MG tablet Take 80 mg by mouth daily.   Yes [provider]  calcium carbonate (TUMS - DOSED IN MG ELEMENTAL CALCIUM) 500 MG chewable tablet Chew 1 tablet by mouth 2 (two) times daily.   Yes  [provider]  cholecalciferol (VITAMIN D) 1000 UNITS tablet Take 1,000 Units by mouth daily.    Yes [provider]  DULoxetine (CYMBALTA) 60 MG capsule Take 60 mg by mouth daily.   Yes [provider]  Fingolimod HCl (GILENYA) 0.5 MG CAPS Take 1 capsule by mouth daily. For Multiple Sclerosis   Yes [provider]  furosemide (LASIX) 20 MG tablet Take 20 mg by mouth daily.   Yes [provider]  HYDROcodone-acetaminophen (NORCO/VICODIN) 5-325 MG tablet Take 1 tablet by mouth every 8 (eight) hours as needed for moderate pain.   Yes [provider]  ibuprofen (ADVIL,MOTRIN) 800 MG tablet Take 800 mg by mouth every 8 (eight) hours as needed for mild pain or moderate pain.  06/18/15  Yes [provider]  levothyroxine (SYNTHROID, LEVOTHROID) 112 MCG tablet Take 112 mcg by mouth daily.   Yes [provider]  Multiple Vitamin (MULTIVITAMIN) capsule Take 1 capsule by mouth daily.   Yes [provider]  nadolol (CORGARD) 80 MG tablet Take 80 mg by mouth 2 (two) times daily.    Yes [provider]  polyethylene glycol powder (GLYCOLAX/MIRALAX) powder  01/18/15  Yes [provider]  primidone (MYSOLINE) 50 MG tablet Take 50  mg by mouth 2 (two) times daily. 50mg  qam & 100mg  qhs   Yes [provider]  risperiDONE (RISPERDAL) 3 MG tablet Take 3 mg by mouth 2 (two) times daily. For depression   Yes [provider]    Family History No family history on file.  Social History Social History  Substance Use Topics  . Smoking status: Never Smoker  . Smokeless tobacco: Never Used  . Alcohol use No     Allergies   Patient has no known allergies.   Review of Systems Review of Systems  Unable to perform ROS: Mental status change     Physical Exam Updated Vital Signs BP (!) 161/94   Pulse 91   Temp (!) 102.4 F (39.1 C) (Rectal)   Resp (!) 28   Ht 6\' 2"  (1.88 m)   Wt 94.3 kg (208  lb)   SpO2 96%   BMI 26.71 kg/m   Physical Exam  Constitutional: He appears well-developed and well-nourished. He appears lethargic.  HENT:  Head: Normocephalic and atraumatic.  Right Ear: External ear normal.  Left Ear: External ear normal.  Nose: Nose normal.  Mouth/Throat: Mucous membranes are dry.  Poor oral care  Eyes: Pupils are equal, round, and reactive to light. Conjunctivae are normal.  Neck: Trachea normal and full passive range of motion without pain.  Cardiovascular: Normal heart sounds and intact distal pulses.  Tachycardia present.   Pulmonary/Chest: He has rales.  Abdominal: Soft. Bowel sounds are normal.  Musculoskeletal: He exhibits deformity.  Neurological: He appears lethargic. He displays tremor.  Pt will tell me his name when asked.  He will squeeze my hands.   He is not saying anything else.  He does have tremors bilaterally (chronic).  He is unable to stand or walk (chronic).  He is not moving his legs even for command.   Unable to access sensation or cranial nerves.  Skin:  Skin feels hot.  No rashes.  Nursing note and vitals reviewed.     ED Treatments / Results  Labs (all labs ordered are listed, but only abnormal results are displayed) Labs Reviewed  COMPREHENSIVE METABOLIC PANEL - Abnormal; Notable for the following:       Result Value   Glucose, Bld 114 (*)    AST 54 (*)    All other components within normal limits  CBC WITH DIFFERENTIAL/PLATELET - Abnormal; Notable for the following:    Neutro Abs 9.6 (*)    Lymphs Abs 0.4 (*)    All other components within normal limits  URINALYSIS, ROUTINE W REFLEX MICROSCOPIC - Abnormal; Notable for the following:    Hgb urine dipstick SMALL (*)    Nitrite POSITIVE (*)    All other components within normal limits  BLOOD GAS, ARTERIAL - Abnormal; Notable for the following:    pO2, Arterial 68.2 (*)    Allens test (pass/fail) NOT INDICATED (*)    All other components within normal limits  CBG  MONITORING, ED - Abnormal; Notable for the following:    Glucose-Capillary 111 (*)    All other components within normal limits  CULTURE, BLOOD (ROUTINE X 2)  CULTURE, BLOOD (ROUTINE X 2)  URINE CULTURE  TROPONIN I  I-STAT CG4 LACTIC ACID, ED    EKG  EKG Interpretation  Date/Time:  Wednesday October 01 2016 09:09:10 EDT Ventricular Rate:  90 PR Interval:    QRS Duration: 89 QT Interval:  348 QTC Calculation: 426 R Axis:   47 Text Interpretation:  Normal sinus rhythm baseline tremor Confirmed by Isla Pence [phone]) on 10/01/2016 9:31:26 AM       Radiology Dg Chest Port 1 View  Result Date: 10/01/2016 CLINICAL DATA:  Altered mental status, history of Graves disease, shortness of breath EXAM: PORTABLE CHEST 1 VIEW COMPARISON:  04/08/2012 FINDINGS: Bilateral diffuse mild interstitial thickening. Linear band of airspace disease in the lingula likely reflecting atelectasis. No pleural effusion or pneumothorax. Stable cardiomediastinal silhouette. No acute osseous abnormality. IMPRESSION: 1. Bilateral diffuse mild interstitial thickening which may reflect mild interstitial edema versus infection. Electronically Signed   By: Kathreen Devoid   On: 10/01/2016 09:51    Procedures Procedures (including critical care time)  Medications Ordered in ED Medications  cefTRIAXone (ROCEPHIN) 1 g in dextrose 5 % 50 mL IVPB (not administered)  sodium chloride 0.9 % bolus 1,000 mL (0 mLs Intravenous Stopped 10/01/16 1056)    And  sodium chloride 0.9 % bolus 1,000 mL (0 mLs Intravenous Stopped 10/01/16 1055)    And  sodium chloride 0.9 % bolus 1,000 mL (1,000 mLs Intravenous New Bag/Given 10/01/16 1011)  acetaminophen (TYLENOL) suppository 650 mg (650 mg Rectal Given 10/01/16 0928)  azithromycin (ZITHROMAX) 500 mg in dextrose 5 % 250 mL IVPB (500 mg Intravenous New Bag/Given 10/01/16 1011)     Initial Impression / Assessment and Plan / ED Course  I have reviewed the triage vital signs and the  nursing notes.  Pertinent labs & imaging results that were available during my care of the patient were reviewed by me and considered in my medical decision making (see chart for details).     This patient meets SIRS Criteria and may be septic. SIRS = Systemic Inflammatory Response Syndrome  Best Practice Recommends:   Notify the nurse immediately to increase monitoring of patient.   The recent clinical data is shown below. Vitals:   10/01/16 0907 10/01/16 0939 10/01/16 1000 10/01/16 1030  BP:  (!) 160/75 (!) 152/76 (!) 161/94  Pulse:  90 88 91  Resp:  (!) 27 (!) 27 (!) 28  Temp:  (!) 102.4 F (39.1 C)    TempSrc:  Rectal    SpO2:  93% 96% 96%  Weight: 94.3 kg (208 lb)     Height: 6\' 2"  (1.88 m)      Pt's mental status has improved with treatment of fever and IVFs.  CRITICAL CARE Performed by: Isla Pence   Total critical care time: 30 minutes  Critical care time was exclusive of separately billable procedures and treating other patients.  Critical care was necessary to treat or prevent imminent or life-threatening deterioration.  Critical care was time spent personally by me on the following activities: development of treatment plan with patient and/or surrogate as well as nursing, discussions with consultants, evaluation of patient's response to treatment, examination of patient, obtaining history from patient or surrogate, ordering and performing treatments and interventions, ordering and review of laboratory studies, ordering and review of radiographic studies, pulse oximetry and re-evaluation of patient's condition.  Pt d/w Dr. Clementeen Graham ([facility]) who will admit to SDU.  Final Clinical Impressions(s) / ED Diagnoses   Final diagnoses:  Sepsis, due to unspecified organism ([place])  Fever, unspecified fever cause  Community acquired pneumonia, unspecified laterality  Acute respiratory failure with hypoxia ([place])    New Prescriptions New Prescriptions   No  medications on file     Isla Pence, MD 10/01/16 1114

## 2016-10-01 NOTE — ED Triage Notes (Signed)
Pt comes in by EMS from [facility] for ams and elevated temp of 102. Pt has hx of MS. Pt had rales in left upper and right lower. He is usually responsive.

## 2016-10-01 NOTE — H&P (Addendum)
TRH H&P   Patient Demographics:    Mark Kramer, is a 56 y.o. male  MRN: [id number]   DOB - Dec 02, 1960  Admit Date - 10/01/2016  Outpatient Primary MD for the patient is Caprice Renshaw, MD  Referring MD Dr. Gilford Raid  Outpatient Specialists: None  Patient coming from: [facility] nursing center  Chief complaint: Altered mental status    HPI:    Mark Kramer  is a 56 y.o. male, With history of multiple sclerosis, wheelchair-bound who is a resident of a nursing center and was sent to the ED this morning with fever of 102F, O2 sat drop to the 80s and being encephalopathic. As the nursing staff. Was not responding well and was lethargic. History obtained by ED physician as patient unable to provide any details. No recent illness, change in medications or fall documented. No history of nausea, vomiting, blurred vision, chest pain, shortness of breath, abdominal pain, diarrhea or dysuria. Patient was placed on 4 L O2 via nasal cannula and brought to the ED. In the ED patient was septic with fever of 102.30F and tachypneic. Blood work showed WBC of 10.4, normal hemoglobin and platelets and chemistry. Lactic acid was normal. ABG showed pH of 7.43, PCO2 of 36 and PO2 of 68. Patient given 3 L IV normal saline bolus per sepsis protocol. Chest x-ray showed bilateral diffuse interstitial thickening concerning for infection. UA negative for infection. Cultures sent and given empiric IV Rocephin and azithromycin. Hospitalist admission requested to stepdown unit.  During my evaluation patient was more awake and oriented to time and person. His mother was at bedside who informed that normally patient is well oriented and is wheelchair-bound from his MS. She noticed that yesterday morning he was fine but during the afternoon he was more sleepy and occasionally drooling from the side of the mouth and was also  complaining of pain in the left side of the neck.    Review of systems:    Review of systems Limited due to patient's encephalopathy. In addition to the HPI above,  Fever + Increase confusion No Headache, No changes with Vision or hearing, No problems swallowing food or Liquids, No Chest pain, Cough or Shortness of Breath, No Abdominal pain, No Nausea or vomiting,, Bowel movements are regular, No Blood in stool or Urine, No dysuria, No new skin rashes or bruises, No new joints pains-aches,  No new weakness, tingling, numbness in any extremity, No recent weight gain or loss, No polyuria, polydypsia or polyphagia,   A full 10 point Review of Systems was done, except as stated above, all other Review of Systems were negative.   With Past History of the following :    Past Medical History:  Diagnosis Date  . Grave's disease   . High cholesterol   . Multiple sclerosis [facility])       Past Surgical History:  Procedure  Laterality Date  . APPENDECTOMY        Social History:     Social History  Substance Use Topics  . Smoking status: Never Smoker  . Smokeless tobacco: Never Used  . Alcohol use No     Lives - At SNF  Mobility - wheelchair-bound   Family History :   No family history of heart disease, stroke, diabetes mellitus   Home Medications:   Prior to Admission medications   Medication Sig Start Date End Date Taking? Authorizing Provider  acetaminophen (TYLENOL) 650 MG CR tablet Take 650 mg by mouth every 8 (eight) hours as needed for pain.   Yes [provider]  ALPRAZolam (XANAX) 0.25 MG tablet Take 0.25 mg by mouth daily.   Yes [provider]  amLODipine (NORVASC) 10 MG tablet Take 1 tablet (10 mg total) by mouth daily. 04/13/12  Yes Oswald Hillock, MD  atorvastatin (LIPITOR) 80 MG tablet Take 80 mg by mouth daily.   Yes [provider]  calcium carbonate (TUMS - DOSED IN MG ELEMENTAL CALCIUM) 500 MG chewable tablet Chew 1 tablet  by mouth 2 (two) times daily.   Yes [provider]  cholecalciferol (VITAMIN D) 1000 UNITS tablet Take 1,000 Units by mouth daily.    Yes [provider]  DULoxetine (CYMBALTA) 60 MG capsule Take 60 mg by mouth daily.   Yes [provider]  Fingolimod HCl (GILENYA) 0.5 MG CAPS Take 1 capsule by mouth daily. For Multiple Sclerosis   Yes [provider]  furosemide (LASIX) 20 MG tablet Take 20 mg by mouth daily.   Yes [provider]  HYDROcodone-acetaminophen (NORCO/VICODIN) 5-325 MG tablet Take 1 tablet by mouth every 8 (eight) hours as needed for moderate pain.   Yes [provider]  ibuprofen (ADVIL,MOTRIN) 800 MG tablet Take 800 mg by mouth every 8 (eight) hours as needed for mild pain or moderate pain.  06/18/15  Yes [provider]  levothyroxine (SYNTHROID, LEVOTHROID) 112 MCG tablet Take 112 mcg by mouth daily.   Yes [provider]  Multiple Vitamin (MULTIVITAMIN) capsule Take 1 capsule by mouth daily.   Yes [provider]  nadolol (CORGARD) 80 MG tablet Take 80 mg by mouth 2 (two) times daily.    Yes [provider]  polyethylene glycol powder (GLYCOLAX/MIRALAX) powder  01/18/15  Yes [provider]  primidone (MYSOLINE) 50 MG tablet Take 50 mg by mouth 2 (two) times daily. 50mg  qam & 100mg  qhs   Yes [provider]  risperiDONE (RISPERDAL) 3 MG tablet Take 3 mg by mouth 2 (two) times daily. For depression   Yes [provider]     Allergies:    No Known Allergies   Physical Exam:   Vitals  Blood pressure (!) 146/74, pulse 81, temperature 98.6 F (37 C), temperature source Oral, resp. rate (!) 23, height 6\' 2"  (1.88 m), weight 94.3 kg (208 lb), SpO2 97 %.    General Middle aged male lying in bed sleepy but arousable, appears fatigued. Sweaty on exam HEENT: No pallor, no icterus, moist mucosa, no JVD, neck flexed to left side and complains of pain on the left side of  the neck since past few days. Chest: Diminished breath sounds over lung bases, no rhonchi or wheeze CVS: Normal S1 and S2, no murmurs rub or gallop GI: Soft, nondistended, nontender, bowel sounds present Muscular skeletal: Warm, trace pedal edema CNS: Alert and oriented to time and person (thinks that  he is in the nursing home in [place] where he was before), normal motor tone and reflex, no neck rigidity     Data Review:    CBC  Recent Labs Lab 10/01/16 0915  WBC 10.4  HGB 16.0  HCT 48.9  PLT 207  MCV 85.9  MCH 28.1  MCHC 32.7  RDW 14.2  LYMPHSABS 0.4*  MONOABS 0.4  EOSABS 0.0  BASOSABS 0.0   ------------------------------------------------------------------------------------------------------------------  Chemistries   Recent Labs Lab 10/01/16 0915  NA 140  K 4.1  CL 102  CO2 28  GLUCOSE 114*  BUN 18  CREATININE 0.89  CALCIUM 9.4  AST 54*  ALT 27  ALKPHOS 122  BILITOT 1.1   ------------------------------------------------------------------------------------------------------------------ estimated creatinine clearance is 109 mL/min (by C-G formula based on SCr of 0.89 mg/dL). ------------------------------------------------------------------------------------------------------------------ No results for input(s): TSH, T4TOTAL, T3FREE, THYROIDAB in the last 72 hours.  Invalid input(s): FREET3  Coagulation profile No results for input(s): INR, PROTIME in the last 168 hours. ------------------------------------------------------------------------------------------------------------------- No results for input(s): DDIMER in the last 72 hours. -------------------------------------------------------------------------------------------------------------------  Cardiac Enzymes  Recent Labs Lab 10/01/16 0915  TROPONINI <0.03   ------------------------------------------------------------------------------------------------------------------ No results found  for: BNP   ---------------------------------------------------------------------------------------------------------------  Urinalysis    Component Value Date/Time   COLORURINE YELLOW 10/01/2016 0910   APPEARANCEUR CLEAR 10/01/2016 0910   LABSPEC 1.011 10/01/2016 0910   PHURINE 6.0 10/01/2016 0910   GLUCOSEU NEGATIVE 10/01/2016 0910   HGBUR SMALL (A) 10/01/2016 0910   BILIRUBINUR NEGATIVE 10/01/2016 0910   KETONESUR NEGATIVE 10/01/2016 0910   PROTEINUR NEGATIVE 10/01/2016 0910   UROBILINOGEN 1.0 04/09/2012 1232   NITRITE POSITIVE (A) 10/01/2016 0910   LEUKOCYTESUR NEGATIVE 10/01/2016 0910    ----------------------------------------------------------------------------------------------------------------   Imaging Results:    Dg Chest Port 1 View  Result Date: 10/01/2016 CLINICAL DATA:  Altered mental status, history of Graves disease, shortness of breath EXAM: PORTABLE CHEST 1 VIEW COMPARISON:  04/08/2012 FINDINGS: Bilateral diffuse mild interstitial thickening. Linear band of airspace disease in the lingula likely reflecting atelectasis. No pleural effusion or pneumothorax. Stable cardiomediastinal silhouette. No acute osseous abnormality. IMPRESSION: 1. Bilateral diffuse mild interstitial thickening which may reflect mild interstitial edema versus infection. Electronically Signed   By: Kathreen Devoid   On: 10/01/2016 09:51    My personal review of EKG:    Assessment & Plan:    Active Problems:   Sepsis ([place]) Suspected due to pneumonia with acute respiratory failure with hypoxia and chest x-ray suggestive of bilateral infiltrates. Patient maintaining O2 sat in high 90s on 3 L via nasal cannula. Vitals now stable and can be transitioned to telemetry. -Received 2 L IV bolus in the ED. -Continue empiric IV Rocephin and azithromycin. Follow blood culture. Check flu PCR. -Keep nothing by mouth for now.   Active problems Acute toxic- metabolic encephalopathy Secondary to sepsis.  Keep nothing by mouth. Mental status slowly improving. Continue neuro checks. Resume home dose Percocet but will avoid benzos.  Acute respiratory failure with hypoxia ([facility]) Possibly secondary to pneumonia. Continue O2 via nasal cannula and empiric antibiotics.  Essential hypertension Resume home amlodipine and Lasix.  Hyperlipidemia Continue Lipitor.  Hypothyroidism continue Synthroid.    DVT Prophylaxis : Lovenox  AM Labs Ordered, also please review Full Orders  Family Communication: Admission, patients condition and plan of care including tests being ordered have been discussed with the patient and his mother at bedside   Code Status full code  Likely DC to SNF  Condition GUARDED   Consults called: None  Admission status: Inpatient  Time spent in minutes : 70   Louellen Molder M.D on 10/01/2016 at 12:48 PM  Between 7am to 7pm - Pager - [phone]. After 7pm go to [email or web address] - password [facility]  [facility] - Office  [phone]

## 2016-10-01 NOTE — Progress Notes (Signed)
Pt admitted to room 339.  Mother at bedside to answer admitting questions.  Pt is alert with verbal arousal, but not oriented, only to self.  Pt noted both heels boggy.  Also Pressure ulcer noted on right buttock measuring 8.14cm.  All wounds addressed and dressed.  Also, Bilateral ankles noted with +2 pitting edema.  Mother is aware of skin assessment.  Flu PCR pending.  Remains on Droplet precaution currently.  Incontinent of Bowel and bladder.  Mother is bringing adult briefs later in the shift.  Pt is in stable condition, eyes closed.

## 2016-10-01 NOTE — ED Notes (Signed)
Family at bedside. 

## 2016-10-02 ENCOUNTER — Inpatient Hospital Stay (HOSPITAL_COMMUNITY): Payer: Medicare Other

## 2016-10-02 DIAGNOSIS — L89311 Pressure ulcer of right buttock, stage 1: Secondary | ICD-10-CM | POA: Diagnosis present

## 2016-10-02 DIAGNOSIS — L8990 Pressure ulcer of unspecified site, unspecified stage: Secondary | ICD-10-CM | POA: Diagnosis present

## 2016-10-02 LAB — CBC
HCT: 45.9 % (ref 39.0–52.0)
HEMOGLOBIN: 15 g/dL (ref 13.0–17.0)
MCH: 28.3 pg (ref 26.0–34.0)
MCHC: 32.7 g/dL (ref 30.0–36.0)
MCV: 86.6 fL (ref 78.0–100.0)
Platelets: 223 10*3/uL (ref 150–400)
RBC: 5.3 MIL/uL (ref 4.22–5.81)
RDW: 14.5 % (ref 11.5–15.5)
WBC: 7.8 10*3/uL (ref 4.0–10.5)

## 2016-10-02 LAB — BASIC METABOLIC PANEL
ANION GAP: 11 (ref 5–15)
BUN: 16 mg/dL (ref 6–20)
CO2: 24 mmol/L (ref 22–32)
Calcium: 8.9 mg/dL (ref 8.9–10.3)
Chloride: 103 mmol/L (ref 101–111)
Creatinine, Ser: 0.79 mg/dL (ref 0.61–1.24)
GFR calc Af Amer: >60 mL/min (ref >60)
GLUCOSE: 92 mg/dL (ref 65–99)
Potassium: 4 mmol/L (ref 3.5–5.1)
SODIUM: 138 mmol/L (ref 135–145)

## 2016-10-02 LAB — MRSA PCR SCREENING: MRSA BY PCR: NEGATIVE

## 2016-10-02 MED ORDER — FUROSEMIDE 10 MG/ML IJ SOLN
60.0000 mg | Freq: Once | INTRAMUSCULAR | Status: AC
  Administered 2016-10-02: 60 mg via INTRAVENOUS

## 2016-10-02 MED ORDER — ENOXAPARIN SODIUM 40 MG/0.4ML ~~LOC~~ SOLN
40.0000 mg | SUBCUTANEOUS | Status: DC
  Administered 2016-10-02 – 2016-10-08 (×6): 40 mg via SUBCUTANEOUS
  Filled 2016-10-02 (×7): qty 0.4

## 2016-10-02 MED ORDER — LEVOTHYROXINE SODIUM 112 MCG PO TABS
112.0000 ug | ORAL_TABLET | Freq: Every day | ORAL | Status: DC
  Administered 2016-10-03 – 2016-10-04 (×2): 112 ug via ORAL
  Filled 2016-10-02 (×3): qty 1

## 2016-10-02 MED ORDER — IBUPROFEN 800 MG PO TABS: 800.0000 mg | ORAL_TABLET | Freq: Three times a day (TID) | ORAL | Status: DC | PRN

## 2016-10-02 MED ORDER — NADOLOL 40 MG PO TABS
80.0000 mg | ORAL_TABLET | Freq: Two times a day (BID) | ORAL | Status: DC
  Administered 2016-10-02 – 2016-10-04 (×5): 80 mg via ORAL
  Filled 2016-10-02 (×4): qty 2
  Filled 2016-10-02: qty 1
  Filled 2016-10-02: qty 2
  Filled 2016-10-02: qty 1
  Filled 2016-10-02: qty 2
  Filled 2016-10-02: qty 1

## 2016-10-02 MED ORDER — PRIMIDONE 50 MG PO TABS
50.0000 mg | ORAL_TABLET | Freq: Every day | ORAL | Status: DC
  Administered 2016-10-03 – 2016-10-08 (×3): 50 mg via ORAL
  Filled 2016-10-02 (×4): qty 1

## 2016-10-02 MED ORDER — ADULT MULTIVITAMIN W/MINERALS CH
1.0000 | ORAL_TABLET | Freq: Every day | ORAL | Status: DC
  Administered 2016-10-03 – 2016-10-08 (×3): 1 via ORAL
  Filled 2016-10-02 (×4): qty 1

## 2016-10-02 MED ORDER — AMLODIPINE BESYLATE 5 MG PO TABS
10.0000 mg | ORAL_TABLET | Freq: Every day | ORAL | Status: DC
  Administered 2016-10-03 – 2016-10-08 (×3): 10 mg via ORAL
  Filled 2016-10-02 (×4): qty 2

## 2016-10-02 MED ORDER — FUROSEMIDE 20 MG PO TABS
20.0000 mg | ORAL_TABLET | Freq: Every day | ORAL | Status: DC
  Administered 2016-10-03 – 2016-10-04 (×2): 20 mg via ORAL
  Filled 2016-10-02 (×2): qty 1

## 2016-10-02 MED ORDER — FINGOLIMOD HCL 0.5 MG PO CAPS: 1.0000 | ORAL_CAPSULE | Freq: Every day | ORAL | Status: DC

## 2016-10-02 MED ORDER — PRIMIDONE 50 MG PO TABS: 50.0000 mg | ORAL_TABLET | Freq: Two times a day (BID) | ORAL | Status: DC

## 2016-10-02 MED ORDER — ONDANSETRON HCL 4 MG/2ML IJ SOLN
4.0000 mg | Freq: Four times a day (QID) | INTRAMUSCULAR | Status: DC | PRN
Start: 2016-10-02 — End: 2016-10-08

## 2016-10-02 MED ORDER — POLYETHYLENE GLYCOL 3350 17 G PO PACK: 17.0000 g | PACK | Freq: Every day | ORAL | Status: DC | PRN

## 2016-10-02 MED ORDER — HYDROCODONE-ACETAMINOPHEN 5-325 MG PO TABS: 1.0000 | ORAL_TABLET | Freq: Three times a day (TID) | ORAL | Status: DC | PRN

## 2016-10-02 MED ORDER — ATORVASTATIN CALCIUM 40 MG PO TABS
80.0000 mg | ORAL_TABLET | Freq: Every day | ORAL | Status: DC
  Administered 2016-10-03 – 2016-10-08 (×3): 80 mg via ORAL
  Filled 2016-10-02 (×4): qty 2

## 2016-10-02 MED ORDER — ONDANSETRON HCL 4 MG PO TABS: 4.0000 mg | ORAL_TABLET | Freq: Four times a day (QID) | ORAL | Status: DC | PRN

## 2016-10-02 MED ORDER — CALCIUM CARBONATE ANTACID 500 MG PO CHEW
1.0000 | CHEWABLE_TABLET | Freq: Two times a day (BID) | ORAL | Status: DC
  Administered 2016-10-02 – 2016-10-08 (×7): 200 mg via ORAL
  Filled 2016-10-02 (×9): qty 1

## 2016-10-02 MED ORDER — SODIUM CHLORIDE 0.9 % IV SOLN
INTRAVENOUS | Status: DC
  Administered 2016-10-02: 01:00:00 via INTRAVENOUS

## 2016-10-02 MED ORDER — ACETAMINOPHEN 650 MG RE SUPP: 650.0000 mg | Freq: Four times a day (QID) | RECTAL | Status: DC | PRN

## 2016-10-02 MED ORDER — RISPERIDONE 1 MG PO TABS
3.0000 mg | ORAL_TABLET | Freq: Two times a day (BID) | ORAL | Status: DC
  Administered 2016-10-02 – 2016-10-08 (×7): 3 mg via ORAL
  Filled 2016-10-02 (×9): qty 3
  Filled 2016-10-02: qty 6

## 2016-10-02 MED ORDER — FUROSEMIDE 10 MG/ML IJ SOLN
INTRAMUSCULAR | Status: AC
  Administered 2016-10-02: 11:00:00
  Filled 2016-10-02: qty 8

## 2016-10-02 MED ORDER — VITAMIN D 1000 UNITS PO TABS
1000.0000 [IU] | ORAL_TABLET | Freq: Every day | ORAL | Status: DC
Start: 2016-10-02 — End: 2016-10-08
  Administered 2016-10-03 – 2016-10-08 (×3): 1000 [IU] via ORAL
  Filled 2016-10-02 (×4): qty 1

## 2016-10-02 MED ORDER — DEXTROSE 5 % IV SOLN
1.0000 g | INTRAVENOUS | Status: DC
  Administered 2016-10-02 – 2016-10-06 (×5): 1 g via INTRAVENOUS
  Filled 2016-10-02 (×7): qty 10

## 2016-10-02 MED ORDER — DEXTROSE 5 % IV SOLN
500.0000 mg | INTRAVENOUS | Status: DC
  Administered 2016-10-02 – 2016-10-06 (×5): 500 mg via INTRAVENOUS
  Filled 2016-10-02 (×7): qty 500

## 2016-10-02 MED ORDER — PRIMIDONE 50 MG PO TABS
100.0000 mg | ORAL_TABLET | Freq: Every day | ORAL | Status: DC
  Administered 2016-10-02 – 2016-10-07 (×4): 100 mg via ORAL
  Filled 2016-10-02 (×5): qty 2

## 2016-10-02 MED ORDER — ACETAMINOPHEN 325 MG PO TABS: 650.0000 mg | ORAL_TABLET | Freq: Four times a day (QID) | ORAL | Status: DC | PRN

## 2016-10-02 NOTE — Progress Notes (Signed)
Pt becoming Tachy 130's and Desating 60's.  Stat EKG ordered.  Dr. Clementeen Graham notified.  ABG's, & CXR ordered.  Initiated Respiratory.  Pt placed on High Flow 6 lmp per [place].  Sats increased to 90's.  EKG NSR. Will continue to monitor

## 2016-10-02 NOTE — Progress Notes (Signed)
**Note De-Identified  Obfuscation** EKG completed and p[aced in patient chart

## 2016-10-02 NOTE — Progress Notes (Signed)
PROGRESS NOTE                                                                                                                                                                                                             Patient Demographics:    Mark Kramer, is a 56 y.o. male, DOB - 01/14/1960, PYK:[id number]  Admit date - 10/01/2016   Admitting Physician Louellen Molder, MD  Outpatient Primary MD for the patient is Caprice Renshaw, MD  LOS - 1  Outpatient Specialists:None  No chief complaint on file.      Brief Narrative    Mark Kramer  is a 56 y.o. male, With history of multiple sclerosis, wheelchair-bound who is a resident of a nursing center and was sent to the ED this morning with fever of 102F, O2 sat drop to the 80s and being encephalopathic. As the nursing staff. Was not responding well and was lethargic. History obtained by ED physician as patient unable to provide any details. No recent illness, change in medications or fall documented. No history of nausea, vomiting, blurred vision, chest pain, shortness of breath, abdominal pain, diarrhea or dysuria. Patient was placed on 4 L O2 via nasal cannula and brought to the ED. In the ED patient was septic with fever of 102.81F and tachypneic. Blood work showed WBC of 10.4, normal hemoglobin and platelets and chemistry. Lactic acid was normal. ABG showed pH of 7.43, PCO2 of 36 and PO2 of 68. Patient given 3 L IV normal saline bolus per sepsis protocol. Chest x-ray showed bilateral diffuse interstitial thickening concerning for infection. UA negative for infection. Cultures sent and given empiric IV Rocephin and azithromycin. Hospitalist admission requested .     Subjective:   Patient remains afebrile after admission. Nursing staff was concerned this morning as patient became tachycardic on the monitor with a heart rate ranging from 130s-160s and O2 sat dropping to the  60s. On my evaluation patient is more alert and oriented and denies any pain in his left neck which he had yesterday. He has coarse tremors of his hand that is showing increased heart rate on the heart monitor but on exam he is in sinus rhythm.   Assessment  & Plan :    Active Problems:   Sepsis ([place]) Suspected due to pneumonia with acute respiratory failure with hypoxia and  chest x-ray suggestive of bilateral infiltrates. -Sepsis currently resolved. -Continue empiric IV Rocephin and azithromycin. Blood cultures so far negative for growth. Flu PCR negative. Check flu PCR. - obtain swallow evaluation as patient coughing frequently after meals.  Active problems Acute toxic- metabolic encephalopathy Secondary to sepsis. Currently resolved.  Acute respiratory failure with hypoxia ([facility]) Possibly secondary to pneumonia. Patient found to be descending but on evaluation his oxygenation fluctuates from 60-100% within seconds and appears to be artifactual.  Acute pulmonary edema Possibly following fluid resuscitation. Patient congested on exam today with leg edema. Chest x-ray showing pulmonary vascular congestion. Ordered a dose of IV Lasix 60 mg.  Essential hypertension Resume home amlodipine and Lasix.  Hyperlipidemia Continue Lipitor.  Hypothyroidism continue Synthroid.     Code Status : Full code  Family Communication  : Mother at bedside  Disposition Plan  : SNF once clinically improved, possibly in the next 24-40 hours  Barriers For Discharge : Active symptoms  Consults  :  None  Procedures  : CT head  DVT Prophylaxis  :  Lovenox -   Lab Results  Component Value Date   PLT 223 10/02/2016    Antibiotics  :   Anti-infectives    Start     Dose/Rate Route Frequency Ordered Stop   10/02/16 1000  cefTRIAXone (ROCEPHIN) 1 g in dextrose 5 % 50 mL IVPB     1 g 100 mL/hr over 30 Minutes Intravenous Every 24 hours 10/02/16 0044     10/02/16 1000  azithromycin  (ZITHROMAX) 500 mg in dextrose 5 % 250 mL IVPB     500 mg 250 mL/hr over 60 Minutes Intravenous Every 24 hours 10/02/16 0044     10/01/16 1015  cefTRIAXone (ROCEPHIN) 1 g in dextrose 5 % 50 mL IVPB     1 g 100 mL/hr over 30 Minutes Intravenous  Once 10/01/16 1004 10/01/16 1159   10/01/16 1015  azithromycin (ZITHROMAX) 500 mg in dextrose 5 % 250 mL IVPB     500 mg 250 mL/hr over 60 Minutes Intravenous  Once 10/01/16 1004 10/01/16 1121        Objective:   Vitals:   10/01/16 2029 10/01/16 2052 10/02/16 0628 10/02/16 0933  BP:  (!) 153/76 (!) 150/71   Pulse:  66 63   Resp:  20 20   Temp:   (!) 97.4 F (36.3 C)   TempSrc:   Oral   SpO2: 97% 100% 99% 100%  Weight:      Height:        Wt Readings from Last 3 Encounters:  10/01/16 99.9 kg (220 lb 3.2 oz)  02/26/16 94.3 kg (208 lb)  02/01/15 94.3 kg (208 lb)     Intake/Output Summary (Last 24 hours) at 10/02/16 1421 Last data filed at 10/02/16 0800  Gross per 24 hour  Intake           161.25 ml  Output                0 ml  Net           161.25 ml     Physical Exam  Gen: not in distress, Fatigued HEENT:  moist mucosa, supple neck Chest: Diminished bibasilar breath sounds CVS: N S1&S2, no murmurs, rubs or gallop GI: soft, NT, ND,  Musculoskeletal: warm, 1+ pitting edema bilaterally CNS: AAOX2-3, non focal    Data Review:    CBC  Recent Labs Lab 10/01/16 0915 10/02/16 0631  WBC 10.4  7.8  HGB 16.0 15.0  HCT 48.9 45.9  PLT 207 223  MCV 85.9 86.6  MCH 28.1 28.3  MCHC 32.7 32.7  RDW 14.2 14.5  LYMPHSABS 0.4*  --   MONOABS 0.4  --   EOSABS 0.0  --   BASOSABS 0.0  --     Chemistries   Recent Labs Lab 10/01/16 0915 10/02/16 0631  NA 140 138  K 4.1 4.0  CL 102 103  CO2 28 24  GLUCOSE 114* 92  BUN 18 16  CREATININE 0.89 0.79  CALCIUM 9.4 8.9  AST 54*  --   ALT 27  --   ALKPHOS 122  --   BILITOT 1.1  --     ------------------------------------------------------------------------------------------------------------------ No results for input(s): CHOL, HDL, LDLCALC, TRIG, CHOLHDL, LDLDIRECT in the last 72 hours.  No results found for: HGBA1C ------------------------------------------------------------------------------------------------------------------ No results for input(s): TSH, T4TOTAL, T3FREE, THYROIDAB in the last 72 hours.  Invalid input(s): FREET3 ------------------------------------------------------------------------------------------------------------------ No results for input(s): VITAMINB12, FOLATE, FERRITIN, TIBC, IRON, RETICCTPCT in the last 72 hours.  Coagulation profile No results for input(s): INR, PROTIME in the last 168 hours.  No results for input(s): DDIMER in the last 72 hours.  Cardiac Enzymes  Recent Labs Lab 10/01/16 0915  TROPONINI <0.03   ------------------------------------------------------------------------------------------------------------------ No results found for: BNP  Inpatient Medications  Scheduled Meds: . amLODipine  10 mg Oral Daily  . atorvastatin  80 mg Oral Daily  . calcium carbonate  1 tablet Oral BID  . cholecalciferol  1,000 Units Oral Daily  . enoxaparin (LOVENOX) injection  40 mg Subcutaneous Q24H  . Fingolimod HCl  1 capsule Oral Daily  . furosemide  20 mg Oral Daily  . levothyroxine  112 mcg Oral QAC breakfast  . multivitamin with minerals  1 tablet Oral Daily  . nadolol  80 mg Oral BID  . primidone  100 mg Oral QHS  . primidone  50 mg Oral Daily  . risperiDONE  3 mg Oral BID   Continuous Infusions: . azithromycin Stopped (10/02/16 1100)  . cefTRIAXone (ROCEPHIN)  IV Stopped (10/02/16 1030)   PRN Meds:.acetaminophen **OR** acetaminophen, HYDROcodone-acetaminophen, ibuprofen, ondansetron **OR** ondansetron (ZOFRAN) IV, polyethylene glycol  Micro Results Recent Results (from the past 240 hour(s))  Blood Culture  (routine x 2)     Status: None (Preliminary result)   Collection Time: 10/01/16  9:10 AM  Result Value Ref Range Status   Specimen Description BLOOD RIGHT FOREARM  Final   Special Requests   Final    BOTTLES DRAWN AEROBIC AND ANAEROBIC Blood Culture adequate volume   Culture NO GROWTH < 24 HOURS  Final   Report Status PENDING  Incomplete  Blood Culture (routine x 2)     Status: None (Preliminary result)   Collection Time: 10/01/16  9:15 AM  Result Value Ref Range Status   Specimen Description LEFT ANTECUBITAL  Final   Special Requests   Final    BOTTLES DRAWN AEROBIC AND ANAEROBIC Blood Culture adequate volume   Culture NO GROWTH < 24 HOURS  Final   Report Status PENDING  Incomplete  MRSA PCR Screening     Status: None   Collection Time: 10/01/16  8:26 PM  Result Value Ref Range Status   MRSA by PCR NEGATIVE NEGATIVE Final    Comment:        The GeneXpert MRSA Assay (FDA approved for NASAL specimens only), is one component of a comprehensive MRSA colonization surveillance program. It is not intended to diagnose  MRSA infection nor to guide or monitor treatment for MRSA infections.     Radiology Reports Dg Chest Port 1 View  Result Date: 10/02/2016 CLINICAL DATA:  Respiratory failure. EXAM: PORTABLE CHEST 1 VIEW COMPARISON:  10/01/2016 FINDINGS: Low lung volumes with vascular congestion, interstitial prominence and bibasilar atelectasis. Question mild interstitial edema. Heart is mildly enlarged, likely accentuated by the low volumes. IMPRESSION: Vascular congestion with interstitial prominence, question interstitial edema. Low volumes with bibasilar atelectasis. Electronically Signed   By: Rolm Baptise M.D.   On: 10/02/2016 09:53   Dg Chest Port 1 View  Result Date: 10/01/2016 CLINICAL DATA:  Altered mental status, history of Graves disease, shortness of breath EXAM: PORTABLE CHEST 1 VIEW COMPARISON:  04/08/2012 FINDINGS: Bilateral diffuse mild interstitial thickening. Linear  band of airspace disease in the lingula likely reflecting atelectasis. No pleural effusion or pneumothorax. Stable cardiomediastinal silhouette. No acute osseous abnormality. IMPRESSION: 1. Bilateral diffuse mild interstitial thickening which may reflect mild interstitial edema versus infection. Electronically Signed   By: Kathreen Devoid   On: 10/01/2016 09:51    Time Spent in minutes  25   Louellen Molder M.D on 10/02/2016 at 2:21 PM  Between 7am to 7pm - Pager - [phone]  After 7pm go to [email or web address] - password [email or web address]  [facility] -  Office  [phone]

## 2016-10-02 NOTE — Evaluation (Signed)
Clinical/Bedside Swallow Evaluation Patient Details  Name: Mark Kramer MRN: [id number] Date of Birth: 09/16/1960  Today's Date: 10/02/2016 Time: SLP Start Time (ACUTE ONLY): 1445 SLP Stop Time (ACUTE ONLY): 1515 SLP Time Calculation (min) (ACUTE ONLY): 30 min  Past Medical History:  Past Medical History:  Diagnosis Date  . Grave's disease   . High cholesterol   . Multiple sclerosis ([place])    Past Surgical History:  Past Surgical History:  Procedure Laterality Date  . APPENDECTOMY     HPI:  [name] a 56 y.o.male with history of multiple sclerosis, wheelchair-bound who is a resident of a nursing center ([place]) and was sent to the ED this morning with fever of 102F, O2 sat drop to the 80s and being encephalopathic. Was not responding well and was lethargic. No recent illness, change in medications or fall documented. No history of nausea, vomiting, blurred vision, chest pain, shortness of breath, abdominal pain, diarrhea or dysuria.   Assessment / Plan / Recommendation Clinical Impression  Clinical swallow evaluation completed at bedside with family present. Pt and family deny h/o dysphagia despite skilled care required for ADLs due to severity of MS. Pt reports that he typically eats what he wants. Oral motor examination reveals slow execution of oral motor movements with seemingly adequate strength and ROM. Pt unable to efficiently drink from cup due to head tremor, which he reports is baseline and always drinks from a straw. He has poor head control and benefited from additional pillow to help with positioning. Pt without overt signs or symptoms of aspiration with sequential straw sips of thin. Pt demonstrated reduced ROM with mastication of solids and one dry cough elicited. He appeared to have difficulty coordinating respiration with mastication (as mastication was prolonged but inefficient). Pt reported ease of mastication and intake when provided with mech soft textures. Will  downgrade to D3/mech soft with thin and f/u tomorrow for upgrades if appropriate. Pt with reported significant improvement today and Pt reports feeling close to baseline. SLP will follow during acute stay. Pt and family in agreement with plan of care.   SLP Visit Diagnosis: Dysphagia, oropharyngeal phase (R13.12)    Aspiration Risk  Mild aspiration risk    Diet Recommendation Dysphagia 3 (Mech soft);Thin liquid   Liquid Administration via: Straw Medication Administration: Whole meds with liquid Supervision: Staff to assist with self feeding;Full supervision/cueing for compensatory strategies Compensations: Slow rate Postural Changes: Seated upright at 90 degrees;Remain upright for at least 30 minutes after po intake    Other  Recommendations Oral Care Recommendations: Oral care BID;Staff/trained caregiver to provide oral care Other Recommendations: Clarify dietary restrictions   Follow up Recommendations Skilled Nursing facility      Frequency and Duration min 2x/week  1 week       Prognosis Prognosis for Safe Diet Advancement: Good      Swallow Study   General Date of Onset: 10/01/16 HPI: [name] a 56 y.o.male with history of multiple sclerosis, wheelchair-bound who is a resident of a nursing center ([place]) and was sent to the ED this morning with fever of 102F, O2 sat drop to the 80s and being encephalopathic. Was not responding well and was lethargic. No recent illness, change in medications or fall documented. No history of nausea, vomiting, blurred vision, chest pain, shortness of breath, abdominal pain, diarrhea or dysuria. Type of Study: Bedside Swallow Evaluation Previous Swallow Assessment: none on record Diet Prior to this Study: Regular;Thin liquids Temperature Spikes Noted: Yes (102 yesterday AM in ED) Respiratory  Status: Nasal cannula History of Recent Intubation: No Behavior/Cognition: Alert;Cooperative;Pleasant mood Oral Cavity Assessment:  (white  coating on dorsal surface of tongue) Oral Care Completed by SLP: Yes Oral Cavity - Dentition: Adequate natural dentition Vision: Functional for self-feeding (needs feeder A due to tremor) Self-Feeding Abilities: Total assist Patient Positioning: Upright in bed (Pt benefited from additional pillow behind head) Baseline Vocal Quality: Normal;Low vocal intensity Volitional Cough: Weak Volitional Swallow: Able to elicit    Oral/Motor/Sensory Function Overall Oral Motor/Sensory Function: Mild impairment (slow movements) Mandible:  (reduced opening, but WFL)   Ice Chips Ice chips: Within functional limits Presentation: Spoon   Thin Liquid Thin Liquid: Within functional limits Presentation: Straw (presented with straw due to head tremor)    Nectar Thick Nectar Thick Liquid: Not tested   Honey Thick Honey Thick Liquid: Not tested   Puree Puree: Within functional limits Presentation: Spoon   Solid      Solid: Impaired Presentation: Spoon (and bit into peanut butter cracker) Oral Phase Impairments: Impaired mastication Oral Phase Functional Implications: Prolonged oral transit;Impaired mastication;Oral residue (min oral residue) Pharyngeal Phase Impairments: Multiple swallows;Cough - Delayed (cough x1 when masticating peanut butter cracker)       Thank you,  Mark Kramer, [place]  Kramer,Mark 10/02/2016,3:44 PM

## 2016-10-03 LAB — BLOOD CULTURE ID PANEL (REFLEXED)
ACINETOBACTER BAUMANNII: NOT DETECTED
CANDIDA PARAPSILOSIS: NOT DETECTED
Candida albicans: NOT DETECTED
Candida glabrata: NOT DETECTED
Candida krusei: NOT DETECTED
Candida tropicalis: NOT DETECTED
ENTEROCOCCUS SPECIES: NOT DETECTED
Enterobacter cloacae complex: NOT DETECTED
Enterobacteriaceae species: NOT DETECTED
Escherichia coli: NOT DETECTED
HAEMOPHILUS INFLUENZAE: NOT DETECTED
KLEBSIELLA OXYTOCA: NOT DETECTED
Klebsiella pneumoniae: NOT DETECTED
Listeria monocytogenes: NOT DETECTED
METHICILLIN RESISTANCE: NOT DETECTED
Neisseria meningitidis: NOT DETECTED
PROTEUS SPECIES: NOT DETECTED
PSEUDOMONAS AERUGINOSA: NOT DETECTED
SERRATIA MARCESCENS: NOT DETECTED
STAPHYLOCOCCUS AUREUS BCID: NOT DETECTED
STAPHYLOCOCCUS SPECIES: DETECTED — AB
STREPTOCOCCUS PNEUMONIAE: NOT DETECTED
Streptococcus agalactiae: NOT DETECTED
Streptococcus pyogenes: NOT DETECTED
Streptococcus species: NOT DETECTED

## 2016-10-03 LAB — BASIC METABOLIC PANEL
Anion gap: 10 (ref 5–15)
BUN: 11 mg/dL (ref 6–20)
CHLORIDE: 99 mmol/L — AB (ref 101–111)
CO2: 27 mmol/L (ref 22–32)
CREATININE: 0.77 mg/dL (ref 0.61–1.24)
Calcium: 9.2 mg/dL (ref 8.9–10.3)
GFR calc Af Amer: >60 mL/min (ref >60)
GFR calc non Af Amer: >60 mL/min (ref >60)
Glucose, Bld: 97 mg/dL (ref 65–99)
POTASSIUM: 3.8 mmol/L (ref 3.5–5.1)
Sodium: 136 mmol/L (ref 135–145)

## 2016-10-03 LAB — AMMONIA: Ammonia: 33 umol/L (ref 9–35)

## 2016-10-03 LAB — GLUCOSE, CAPILLARY
GLUCOSE-CAPILLARY: 84 mg/dL (ref 65–99)
Glucose-Capillary: 69 mg/dL (ref 65–99)

## 2016-10-03 LAB — CBC
HEMATOCRIT: 48.1 % (ref 39.0–52.0)
HEMOGLOBIN: 16.1 g/dL (ref 13.0–17.0)
MCH: 29 pg (ref 26.0–34.0)
MCHC: 33.5 g/dL (ref 30.0–36.0)
MCV: 86.7 fL (ref 78.0–100.0)
Platelets: 257 10*3/uL (ref 150–400)
RBC: 5.55 MIL/uL (ref 4.22–5.81)
RDW: 14.2 % (ref 11.5–15.5)
WBC: 6 10*3/uL (ref 4.0–10.5)

## 2016-10-03 LAB — HIV ANTIBODY (ROUTINE TESTING W REFLEX): HIV Screen 4th Generation wRfx: NONREACTIVE

## 2016-10-03 LAB — URINE CULTURE

## 2016-10-03 NOTE — Progress Notes (Signed)
  Speech Language Pathology Treatment: Dysphagia  Patient Details Name: Mark Kramer MRN: [id number] DOB: September 07, 1960 Today's Date: 10/03/2016 Time: 9021-1155 SLP Time Calculation (min) (ACUTE ONLY): 20 min  Assessment / Plan / Recommendation Clinical Impression  Dysphagia treatment provided to check for diet tolerance. On regular diet trials pt had a delayed, dry cough x1; no other overt s/s of aspiration noted. Suspect that pt's reduced head/ trunk control may impact swallow safety during meals. RN reported that pt doing much better since diet was downgraded to dysphagia 3, although pt with some increased confusion today; repeatedly asked "when will I get out of this chair?" while in bed. Recommend continuing dysphagia 3/ thin liquids with full assist for feeding, providing small bites/ sips by straw at a time, seated upright as possible with pillows to support positioning. Will f/u to check continued tolerance of diet/ consider advancement as status improves.  HPI HPI: [name] a 56 y.o.male with history of multiple sclerosis, wheelchair-bound who is a resident of a nursing center ([place]) and was sent to the ED this morning with fever of 102F, O2 sat drop to the 80s and being encephalopathic. Was not responding well and was lethargic. No recent illness, change in medications or fall documented. No history of nausea, vomiting, blurred vision, chest pain, shortness of breath, abdominal pain, diarrhea or dysuria.      SLP Plan  Continue with current plan of care       Recommendations  Diet recommendations: Dysphagia 3 (mechanical soft);Thin liquid Liquids provided via: Straw Medication Administration: Whole meds with liquid Supervision: Staff to assist with self feeding;Full supervision/cueing for compensatory strategies Compensations: Slow rate;Small sips/bites Postural Changes and/or Swallow Maneuvers: Seated upright 90 degrees                Oral Care Recommendations:  Oral care BID Follow up Recommendations: Skilled Nursing facility SLP Visit Diagnosis: Dysphagia, unspecified (R13.10) Plan: Continue with current plan of care       GO                Mark Reap, MA, CCC-SLP 10/03/2016, 5:00 PM

## 2016-10-03 NOTE — NC FL2 (Signed)
[place] LEVEL OF CARE SCREENING TOOL     IDENTIFICATION  Patient Name: Mark Kramer Birthdate: 03-08-1960 Sex: male Admission Date (Current Location): 10/01/2016  [place] and [place] Number:  [place] [id number] ([id number]) Facility and Address:  [facility] [address], [place]      Provider Number: [id number]  Attending Physician Name and Address:  Louellen Molder, MD  Relative Name and Phone Number:       Current Level of Care:   Recommended Level of Care:   Prior Approval Number:    Date Approved/Denied:   PASRR Number: [id number] ([id number])  Discharge Plan: SNF    Current Diagnoses: Patient Active Problem List   Diagnosis Date Noted  . Pressure injury of skin 10/02/2016  . Decubitus ulcer of right buttock, stage 1 10/02/2016  . Sepsis ([place]) 10/01/2016  . Tremor 07/26/2014  . Other fatigue 07/26/2014  . Urge incontinence 07/26/2014  . Cognitive dysfunction 07/26/2014  . Depression 07/26/2014  . Multiple sclerosis ([place]) 04/09/2012  . Graves disease 04/09/2012  . Dyslipidemia 04/09/2012  . Altered mental status 04/09/2012  . UTI (urinary tract infection) 04/09/2012    Orientation RESPIRATION BLADDER Height & Weight     Self, Time, Situation, Place  O2 (HFNC 2L) Incontinent Weight: 220 lb 3.2 oz (99.9 kg) Height:  6\' 1"  (185.4 cm)  BEHAVIORAL SYMPTOMS/MOOD NEUROLOGICAL BOWEL NUTRITION STATUS      Incontinent Diet (see discharge summary)  AMBULATORY STATUS COMMUNICATION OF NEEDS Skin   Total Care Verbally PU Stage and Appropriate Care, Other (Comment) (deep tissue injury; right heel)   PU Stage 2 Dressing:  (buttocks; foam dressing)                   Personal Care Assistance Level of Assistance  Bathing, Feeding, Dressing, Total care Bathing Assistance: Maximum assistance Feeding assistance: Maximum assistance Dressing Assistance: Maximum assistance Total Care Assistance: Maximum assistance    Functional Limitations Info  Sight, Hearing, Speech Sight Info: Adequate Hearing Info: Adequate Speech Info: Adequate    SPECIAL CARE FACTORS FREQUENCY  Speech therapy             Speech Therapy Frequency: 3x/week      Contractures Contractures Info: Present    Additional Factors Info  Code Status, Psychotropic Code Status Info: Full Code   Psychotropic Info: Xanax, Cymbalta         Current Medications (10/03/2016):  This is the current hospital active medication list Current Facility-Administered Medications  Medication Dose Route Frequency Provider Last Rate Last Dose  . acetaminophen (TYLENOL) tablet 650 mg  650 mg Oral Q6H PRN Dhungel, Nishant, MD       Or  . acetaminophen (TYLENOL) suppository 650 mg  650 mg Rectal Q6H PRN Dhungel, Nishant, MD      . amLODipine (NORVASC) tablet 10 mg  10 mg Oral Daily Dhungel, Nishant, MD   10 mg at 10/03/16 0932  . atorvastatin (LIPITOR) tablet 80 mg  80 mg Oral Daily Dhungel, Nishant, MD   80 mg at 10/03/16 0931  . azithromycin (ZITHROMAX) 500 mg in dextrose 5 % 250 mL IVPB  500 mg Intravenous Q24H Dhungel, Nishant, MD   Stopped at 10/03/16 0817  . calcium carbonate (TUMS - dosed in mg elemental calcium) chewable tablet 200 mg of elemental calcium  1 tablet Oral BID Dhungel, Nishant, MD   200 mg of elemental calcium at 10/03/16 0932  . cefTRIAXone (ROCEPHIN) 1 g in dextrose 5 % 50  mL IVPB  1 g Intravenous Q24H Dhungel, Nishant, MD   Stopped at 10/03/16 0817  . cholecalciferol (VITAMIN D) tablet 1,000 Units  1,000 Units Oral Daily Dhungel, Nishant, MD   1,000 Units at 10/03/16 0933  . enoxaparin (LOVENOX) injection 40 mg  40 mg Subcutaneous Q24H Dhungel, Nishant, MD   40 mg at 10/03/16 0931  . furosemide (LASIX) tablet 20 mg  20 mg Oral Daily Dhungel, Nishant, MD   20 mg at 10/03/16 0933  . ibuprofen (ADVIL,MOTRIN) tablet 800 mg  800 mg Oral Q8H PRN Dhungel, Nishant, MD      . levothyroxine (SYNTHROID, LEVOTHROID) tablet 112 mcg   112 mcg Oral QAC breakfast Dhungel, Nishant, MD   112 mcg at 10/03/16 0932  . multivitamin with minerals tablet 1 tablet  1 tablet Oral Daily Dhungel, Nishant, MD   1 tablet at 10/03/16 0932  . nadolol (CORGARD) tablet 80 mg  80 mg Oral BID Dhungel, Nishant, MD   80 mg at 10/03/16 0931  . ondansetron (ZOFRAN) tablet 4 mg  4 mg Oral Q6H PRN Dhungel, Nishant, MD       Or  . ondansetron (ZOFRAN) injection 4 mg  4 mg Intravenous Q6H PRN Dhungel, Nishant, MD      . polyethylene glycol (MIRALAX / GLYCOLAX) packet 17 g  17 g Oral Daily PRN Dhungel, Nishant, MD      . primidone (MYSOLINE) tablet 100 mg  100 mg Oral QHS Dhungel, Nishant, MD   100 mg at 10/02/16 2223  . primidone (MYSOLINE) tablet 50 mg  50 mg Oral Daily Dhungel, Nishant, MD   50 mg at 10/03/16 0933  . risperiDONE (RISPERDAL) tablet 3 mg  3 mg Oral BID Dhungel, Nishant, MD   3 mg at 10/03/16 0932     Discharge Medications: Please see discharge summary for a list of discharge medications.  Relevant Imaging Results:  Relevant Lab Results:   Additional Information SSN [id number] Clydene Pugh, LCSW

## 2016-10-03 NOTE — NC FL2 (Deleted)
[place] LEVEL OF CARE SCREENING TOOL     IDENTIFICATION  Patient Name: Mark Kramer Birthdate: 05-03-1960 Sex: male Admission Date (Current Location): 10/01/2016  [place] and [place] Number:  [place] [facility] [id number] ([id number]) Facility and Address:  [facility] [address] [place]      Provider Number: [id number]  Attending Physician Name and Address:  Louellen Molder, MD  Relative Name and Phone Number:       Current Level of Care:   Recommended Level of Care:   Prior Approval Number:    Date Approved/Denied:   PASRR Number: [id number] ([id number])  Discharge Plan: SNF    Current Diagnoses: Patient Active Problem List   Diagnosis Date Noted  . Pressure injury of skin 10/02/2016  . Decubitus ulcer of right buttock, stage 1 10/02/2016  . Sepsis ([place]) 10/01/2016  . Tremor 07/26/2014  . Other fatigue 07/26/2014  . Urge incontinence 07/26/2014  . Cognitive dysfunction 07/26/2014  . Depression 07/26/2014  . Multiple sclerosis ([place]) 04/09/2012  . Graves disease 04/09/2012  . Dyslipidemia 04/09/2012  . Altered mental status 04/09/2012  . UTI (urinary tract infection) 04/09/2012    Orientation RESPIRATION BLADDER Height & Weight     Self, Time, Situation, Place  O2 (HFNC 6L) Incontinent Weight: 220 lb 3.2 oz (99.9 kg) Height:  6\' 1"  (185.4 cm)  BEHAVIORAL SYMPTOMS/MOOD NEUROLOGICAL BOWEL NUTRITION STATUS      Incontinent Diet (see discharge summary)  AMBULATORY STATUS COMMUNICATION OF NEEDS Skin   Total Care Verbally PU Stage and Appropriate Care, Other (Comment) (deep tissue injury; right heel)   PU Stage 2 Dressing:  (buttocks; foam dressing)                   Personal Care Assistance Level of Assistance  Bathing, Feeding, Dressing, Total care Bathing Assistance: Maximum assistance Feeding assistance: Maximum assistance Dressing Assistance: Maximum assistance Total Care Assistance: Maximum assistance    Functional Limitations Info  Sight, Hearing, Speech Sight Info: Adequate Hearing Info: Adequate Speech Info: Adequate    SPECIAL CARE FACTORS FREQUENCY  Speech therapy             Speech Therapy Frequency: 3x/week      Contractures Contractures Info: Present    Additional Factors Info  Code Status, Psychotropic Code Status Info: Full Code   Psychotropic Info: Xanax, Cymbalta         Current Medications (10/03/2016):  This is the current hospital active medication list Current Facility-Administered Medications  Medication Dose Route Frequency Provider Last Rate Last Dose  . acetaminophen (TYLENOL) tablet 650 mg  650 mg Oral Q6H PRN Dhungel, Nishant, MD       Or  . acetaminophen (TYLENOL) suppository 650 mg  650 mg Rectal Q6H PRN Dhungel, Nishant, MD      . amLODipine (NORVASC) tablet 10 mg  10 mg Oral Daily Dhungel, Nishant, MD   10 mg at 10/03/16 0932  . atorvastatin (LIPITOR) tablet 80 mg  80 mg Oral Daily Dhungel, Nishant, MD   80 mg at 10/03/16 0931  . azithromycin (ZITHROMAX) 500 mg in dextrose 5 % 250 mL IVPB  500 mg Intravenous Q24H Dhungel, Nishant, MD   Stopped at 10/03/16 0817  . calcium carbonate (TUMS - dosed in mg elemental calcium) chewable tablet 200 mg of elemental calcium  1 tablet Oral BID Dhungel, Nishant, MD   200 mg of elemental calcium at 10/03/16 0932  . cefTRIAXone (ROCEPHIN) 1 g in dextrose 5 % 50  mL IVPB  1 g Intravenous Q24H Dhungel, Nishant, MD   Stopped at 10/03/16 0817  . cholecalciferol (VITAMIN D) tablet 1,000 Units  1,000 Units Oral Daily Dhungel, Nishant, MD   1,000 Units at 10/03/16 0933  . enoxaparin (LOVENOX) injection 40 mg  40 mg Subcutaneous Q24H Dhungel, Nishant, MD   40 mg at 10/03/16 0931  . furosemide (LASIX) tablet 20 mg  20 mg Oral Daily Dhungel, Nishant, MD   20 mg at 10/03/16 0933  . HYDROcodone-acetaminophen (NORCO/VICODIN) 5-325 MG per tablet 1 tablet  1 tablet Oral Q8H PRN Dhungel, Nishant, MD      . ibuprofen  (ADVIL,MOTRIN) tablet 800 mg  800 mg Oral Q8H PRN Dhungel, Nishant, MD      . levothyroxine (SYNTHROID, LEVOTHROID) tablet 112 mcg  112 mcg Oral QAC breakfast Dhungel, Nishant, MD   112 mcg at 10/03/16 0932  . multivitamin with minerals tablet 1 tablet  1 tablet Oral Daily Dhungel, Nishant, MD   1 tablet at 10/03/16 0932  . nadolol (CORGARD) tablet 80 mg  80 mg Oral BID Dhungel, Nishant, MD   80 mg at 10/03/16 0931  . ondansetron (ZOFRAN) tablet 4 mg  4 mg Oral Q6H PRN Dhungel, Nishant, MD       Or  . ondansetron (ZOFRAN) injection 4 mg  4 mg Intravenous Q6H PRN Dhungel, Nishant, MD      . polyethylene glycol (MIRALAX / GLYCOLAX) packet 17 g  17 g Oral Daily PRN Dhungel, Nishant, MD      . primidone (MYSOLINE) tablet 100 mg  100 mg Oral QHS Dhungel, Nishant, MD   100 mg at 10/02/16 2223  . primidone (MYSOLINE) tablet 50 mg  50 mg Oral Daily Dhungel, Nishant, MD   50 mg at 10/03/16 0933  . risperiDONE (RISPERDAL) tablet 3 mg  3 mg Oral BID Dhungel, Nishant, MD   3 mg at 10/03/16 0932     Discharge Medications: Please see discharge summary for a list of discharge medications.  Relevant Imaging Results:  Relevant Lab Results:   Additional Information SSN [id number], Clydene Pugh, LCSW

## 2016-10-03 NOTE — Progress Notes (Signed)
Parents at bedside this afternoon . They were concerned he was less interactive today and not eating properly. Also occasionally drooling from the side of the mouth. No active infection and vitals stable. I will discontinue prn perocet he's on. Avoid any narcotics or benzos. He was more interactive and oriented when I reevaluated him after few hours. Labs including ammonia  are normal.  Will hold on further testing or imaging for now.

## 2016-10-03 NOTE — Clinical Social Work Note (Signed)
Clinical Social Work Assessment  Patient Details  Name: Mark Kramer MRN: [id number] Date of Birth: March 26, 1960  Date of referral:  10/03/16               Reason for consult:  Discharge Planning                Permission sought to share information with:    Permission granted to share information::     Name::        Agency::     Relationship::     Contact Information:  Mother, Mark Kramer.  Housing/Transportation Living arrangements for the past 2 months:  [place] of Information:  Parent Patient Interpreter Needed:  None Criminal Activity/Legal Involvement Pertinent to Current Situation/Hospitalization:  No - Comment as needed Significant Relationships:  Parents Lives with:  Facility Resident Do you feel safe going back to the place where you live?  Yes Need for family participation in patient care:  No ([place])  Care giving concerns:  None identified at baseline. Facility resident.    Social Worker assessment / plan:  Patient's mother, Mark Kramer, provided the history. Patient has been a resident at [facility] since May. Patient was previously at [facility] for three years.  Patient is total care. Mother is interested in exploring other facilities but will return to [facility] if patient does not get a bed offer from another facility.  Mother did not pay for a bed hold at [facility].   LCSW to fax clinicals to [facility], [facility] and [facility].  LCSW will follow and coordinate discharge plan with treatment team, patient and family.   Employment status:  Disabled (Comment on whether or not currently receiving Disability) Insurance information:  Medicare, Medicaid [place] PT Recommendations:  Not assessed at this time Information / Referral to community resources:     Patient/Family's Response to care:  Family is agreeable for patient to return to [facility] if other facilities will not accept patient.   Patient/Family's  Understanding of and Emotional Response to Diagnosis, Current Treatment, and Prognosis:  Patient and family understand patient's diagnosis, treatment and prognosis and report that patient needs SNF level care.   Emotional Assessment Appearance:  Appears stated age Attitude/Demeanor/Rapport:    Affect (typically observed):    Orientation:  Oriented to Self, Oriented to Place, Oriented to  Time, Oriented to Situation Alcohol / Substance use:  Not Applicable Psych involvement (Current and /or in the community):  No (Comment)  Discharge Needs  Concerns to be addressed:  Discharge Planning Concerns Readmission within the last 30 days:  No Current discharge risk:  None Barriers to Discharge:  No Barriers Identified   Ihor Gully, LCSW 10/03/2016, 9:38 AM

## 2016-10-03 NOTE — Care Management Important Message (Signed)
Important Message  Patient Details  Name: Mark Kramer MRN: [id number] Date of Birth: 04-22-1960   Medicare Important Message Given:  Yes    Sherald Barge, RN 10/03/2016, 1:57 PM

## 2016-10-03 NOTE — Progress Notes (Signed)
PHARMACY - PHYSICIAN COMMUNICATION CRITICAL VALUE ALERT - BLOOD CULTURE IDENTIFICATION (BCID)  Results for orders placed or performed during the hospital encounter of 10/01/16  Blood Culture ID Panel (Reflexed) (Collected: 10/01/2016  9:10 AM)  Result Value Ref Range   Enterococcus species NOT DETECTED NOT DETECTED   Listeria monocytogenes NOT DETECTED NOT DETECTED   Staphylococcus species DETECTED (A) NOT DETECTED   Staphylococcus aureus NOT DETECTED NOT DETECTED   Methicillin resistance NOT DETECTED NOT DETECTED   Streptococcus species NOT DETECTED NOT DETECTED   Streptococcus agalactiae NOT DETECTED NOT DETECTED   Streptococcus pneumoniae NOT DETECTED NOT DETECTED   Streptococcus pyogenes NOT DETECTED NOT DETECTED   Acinetobacter baumannii NOT DETECTED NOT DETECTED   Enterobacteriaceae species NOT DETECTED NOT DETECTED   Enterobacter cloacae complex NOT DETECTED NOT DETECTED   Escherichia coli NOT DETECTED NOT DETECTED   Klebsiella oxytoca NOT DETECTED NOT DETECTED   Klebsiella pneumoniae NOT DETECTED NOT DETECTED   Proteus species NOT DETECTED NOT DETECTED   Serratia marcescens NOT DETECTED NOT DETECTED   Haemophilus influenzae NOT DETECTED NOT DETECTED   Neisseria meningitidis NOT DETECTED NOT DETECTED   Pseudomonas aeruginosa NOT DETECTED NOT DETECTED   Candida albicans NOT DETECTED NOT DETECTED   Candida glabrata NOT DETECTED NOT DETECTED   Candida krusei NOT DETECTED NOT DETECTED   Candida parapsilosis NOT DETECTED NOT DETECTED   Candida tropicalis NOT DETECTED NOT DETECTED    Name of physician (or Provider) Contacted: Dhungel  Changes to prescribed antibiotics required: Likely contaminant.  Continue current antibiotics.  Mark Kramer 10/03/2016  9:34 AM

## 2016-10-03 NOTE — Progress Notes (Signed)
PROGRESS NOTE                                                                                                                                                                                                             Patient Demographics:    Mark Kramer, is a 56 y.o. male, DOB - 1960/03/25, XTA:[id number]  Admit date - 10/01/2016   Admitting Physician Louellen Molder, MD  Outpatient Primary MD for the patient is Caprice Renshaw, MD  LOS - 2  Outpatient Specialists:None  No chief complaint on file.      Brief Narrative    Mark Kramer  is a 56 y.o. male, With history of multiple sclerosis, wheelchair-bound who is a resident of a nursing center and was sent to the ED this morning with fever of 102F, O2 sat drop to the 80s and being encephalopathic. As the nursing staff. Was not responding well and was lethargic. History obtained by ED physician as patient unable to provide any details. No recent illness, change in medications or fall documented. No history of nausea, vomiting, blurred vision, chest pain, shortness of breath, abdominal pain, diarrhea or dysuria. Patient was placed on 4 L O2 via nasal cannula and brought to the ED. In the ED patient was septic with fever of 102.72F and tachypneic. Blood work showed WBC of 10.4, normal hemoglobin and platelets and chemistry. Lactic acid was normal. ABG showed pH of 7.43, PCO2 of 36 and PO2 of 68. Patient given 3 L IV normal saline bolus per sepsis protocol. Chest x-ray showed bilateral diffuse interstitial thickening concerning for infection. UA negative for infection. Cultures sent and given empiric IV Rocephin and azithromycin. Hospitalist admission requested .     Subjective:   Continues to be better oriented. No overnight events. Remains afebrile.   Assessment  & Plan :    Active Problems:   Sepsis ([place]) Resolved. Suspected due to pneumonia with acute respiratory  failure with hypoxia and chest x-ray suggestive of bilateral infiltrates. -Continue empiric IV Rocephin and azithromycin. Blood cultures 1/2 on admission growing staph species which is likely a contaminant.   Active problems Acute toxic- metabolic encephalopathy Secondary to sepsis. Currently resolved.  Acute respiratory failure with hypoxia (HCC) Possibly secondary to pneumonia. Patient found to be descending but on evaluation his oxygenation fluctuates from 60-100% within seconds and appears to  be artifactual.  Oropharyngeal dysphagia Seen by speech pathology and switch to dysphagia level III diet.  Acute pulmonary edema Possibly following fluid resuscitation. Improved with IV Lasix on 9/20.  Essential hypertension Resume home amlodipine and Lasix.  Hyperlipidemia Continue Lipitor.  Hypothyroidism continue Synthroid.     Code Status : Full code  Family Communication  : Mother at bedside  Disposition Plan  : SNF possibly tomorrow if continues to improve.  Barriers For Discharge : Active symptoms  Consults  :  None  Procedures  : CT head  DVT Prophylaxis  :  Lovenox -   Lab Results  Component Value Date   PLT 223 10/02/2016    Antibiotics  :   Anti-infectives    Start     Dose/Rate Route Frequency Ordered Stop   10/02/16 1000  cefTRIAXone (ROCEPHIN) 1 g in dextrose 5 % 50 mL IVPB     1 g 100 mL/hr over 30 Minutes Intravenous Every 24 hours 10/02/16 0044     10/02/16 1000  azithromycin (ZITHROMAX) 500 mg in dextrose 5 % 250 mL IVPB     500 mg 250 mL/hr over 60 Minutes Intravenous Every 24 hours 10/02/16 0044     10/01/16 1015  cefTRIAXone (ROCEPHIN) 1 g in dextrose 5 % 50 mL IVPB     1 g 100 mL/hr over 30 Minutes Intravenous  Once 10/01/16 1004 10/01/16 1159   10/01/16 1015  azithromycin (ZITHROMAX) 500 mg in dextrose 5 % 250 mL IVPB     500 mg 250 mL/hr over 60 Minutes Intravenous  Once 10/01/16 1004 10/01/16 1121        Objective:   Vitals:     10/02/16 1500 10/02/16 1948 10/02/16 2134 10/03/16 0520  BP: (!) 153/82  131/77 (!) 107/53  Pulse: 64  75 (!) 56  Resp: 20  20 20   Temp: 98 F (36.7 C)  (!) 97.5 F (36.4 C) 98.7 F (37.1 C)  TempSrc: Oral  Oral Oral  SpO2: 99% 98% 99% 100%  Weight:      Height:        Wt Readings from Last 3 Encounters:  10/01/16 99.9 kg (220 lb 3.2 oz)  02/26/16 94.3 kg (208 lb)  02/01/15 94.3 kg (208 lb)     Intake/Output Summary (Last 24 hours) at 10/03/16 1156 Last data filed at 10/03/16 0805  Gross per 24 hour  Intake              780 ml  Output             2250 ml  Net            -1470 ml     Physical Exam  Gen: Middle aged male not in distress, appears fatigued HEENT:  moist mucosa, supple neck Chest: Improved bibasilar breath sounds CVS: N S1&S2, no murmurs, rubs or gallop GI: soft, NT, ND,  Musculoskeletal: warm, trace pitting edema bilaterally CNS: AAOX2-3, non focal, fine tremors    Data Review:    CBC  Recent Labs Lab 10/01/16 0915 10/02/16 0631  WBC 10.4 7.8  HGB 16.0 15.0  HCT 48.9 45.9  PLT 207 223  MCV 85.9 86.6  MCH 28.1 28.3  MCHC 32.7 32.7  RDW 14.2 14.5  LYMPHSABS 0.4*  --   MONOABS 0.4  --   EOSABS 0.0  --   BASOSABS 0.0  --     Chemistries   Recent Labs Lab 10/01/16 0915 10/02/16 0631  NA 140  138  K 4.1 4.0  CL 102 103  CO2 28 24  GLUCOSE 114* 92  BUN 18 16  CREATININE 0.89 0.79  CALCIUM 9.4 8.9  AST 54*  --   ALT 27  --   ALKPHOS 122  --   BILITOT 1.1  --    ------------------------------------------------------------------------------------------------------------------ No results for input(s): CHOL, HDL, LDLCALC, TRIG, CHOLHDL, LDLDIRECT in the last 72 hours.  No results found for: HGBA1C ------------------------------------------------------------------------------------------------------------------ No results for input(s): TSH, T4TOTAL, T3FREE, THYROIDAB in the last 72 hours.  Invalid input(s):  FREET3 ------------------------------------------------------------------------------------------------------------------ No results for input(s): VITAMINB12, FOLATE, FERRITIN, TIBC, IRON, RETICCTPCT in the last 72 hours.  Coagulation profile No results for input(s): INR, PROTIME in the last 168 hours.  No results for input(s): DDIMER in the last 72 hours.  Cardiac Enzymes  Recent Labs Lab 10/01/16 0915  TROPONINI <0.03   ------------------------------------------------------------------------------------------------------------------ No results found for: BNP  Inpatient Medications  Scheduled Meds: . amLODipine  10 mg Oral Daily  . atorvastatin  80 mg Oral Daily  . calcium carbonate  1 tablet Oral BID  . cholecalciferol  1,000 Units Oral Daily  . enoxaparin (LOVENOX) injection  40 mg Subcutaneous Q24H  . furosemide  20 mg Oral Daily  . levothyroxine  112 mcg Oral QAC breakfast  . multivitamin with minerals  1 tablet Oral Daily  . nadolol  80 mg Oral BID  . primidone  100 mg Oral QHS  . primidone  50 mg Oral Daily  . risperiDONE  3 mg Oral BID   Continuous Infusions: . azithromycin Stopped (10/03/16 0817)  . cefTRIAXone (ROCEPHIN)  IV Stopped (10/03/16 0817)   PRN Meds:.acetaminophen **OR** acetaminophen, HYDROcodone-acetaminophen, ibuprofen, ondansetron **OR** ondansetron (ZOFRAN) IV, polyethylene glycol  Micro Results Recent Results (from the past 240 hour(s))  Blood Culture (routine x 2)     Status: None (Preliminary result)   Collection Time: 10/01/16  9:10 AM  Result Value Ref Range Status   Specimen Description BLOOD RIGHT FOREARM  Final   Special Requests   Final    BOTTLES DRAWN AEROBIC AND ANAEROBIC Blood Culture adequate volume   Culture  Setup Time   Final    GROWTH IN AEROBIC BOTTLE GRAM POSITIVE COCCI IN CLUSTERS Gram Stain Report Called to,Read Back By and Verified With: AMY HOBBS AT 1604 BY HFLYNT 10/02/16 Organism ID to follow CRITICAL RESULT  CALLED TO, READ BACK BY AND VERIFIED WITH: M HAYES 10/03/16 @ 0736 M VESTAL    Culture   Final    GRAM POSITIVE COCCI IN CLUSTERS TOO YOUNG TO READ Performed at [facility], [place] [address] [place], [place] [address]    Report Status PENDING  Incomplete  Blood Culture ID Panel (Reflexed)     Status: Abnormal   Collection Time: 10/01/16  9:10 AM  Result Value Ref Range Status   Enterococcus species NOT DETECTED NOT DETECTED Final   Listeria monocytogenes NOT DETECTED NOT DETECTED Final   Staphylococcus species DETECTED (A) NOT DETECTED Final    Comment: Methicillin (oxacillin) susceptible coagulase negative staphylococcus. Possible blood culture contaminant (unless isolated from more than one blood culture draw or clinical case suggests pathogenicity). No antibiotic treatment is indicated for blood  culture contaminants. CRITICAL RESULT CALLED TO, READ BACK BY AND VERIFIED WITH: M HAYES 10/03/16 @ 0736 M VESTAL    Staphylococcus aureus NOT DETECTED NOT DETECTED Final   Methicillin resistance NOT DETECTED NOT DETECTED Final   Streptococcus species NOT DETECTED NOT DETECTED Final  Streptococcus agalactiae NOT DETECTED NOT DETECTED Final   Streptococcus pneumoniae NOT DETECTED NOT DETECTED Final   Streptococcus pyogenes NOT DETECTED NOT DETECTED Final   Acinetobacter baumannii NOT DETECTED NOT DETECTED Final   Enterobacteriaceae species NOT DETECTED NOT DETECTED Final   Enterobacter cloacae complex NOT DETECTED NOT DETECTED Final   Escherichia coli NOT DETECTED NOT DETECTED Final   Klebsiella oxytoca NOT DETECTED NOT DETECTED Final   Klebsiella pneumoniae NOT DETECTED NOT DETECTED Final   Proteus species NOT DETECTED NOT DETECTED Final   Serratia marcescens NOT DETECTED NOT DETECTED Final   Haemophilus influenzae NOT DETECTED NOT DETECTED Final   Neisseria meningitidis NOT DETECTED NOT DETECTED Final   Pseudomonas aeruginosa NOT DETECTED NOT DETECTED Final   Candida albicans NOT  DETECTED NOT DETECTED Final   Candida glabrata NOT DETECTED NOT DETECTED Final   Candida krusei NOT DETECTED NOT DETECTED Final   Candida parapsilosis NOT DETECTED NOT DETECTED Final   Candida tropicalis NOT DETECTED NOT DETECTED Final    Comment: Performed at [facility], [place] [address] [place], [place] [address]  Urine culture     Status: Abnormal   Collection Time: 10/01/16  9:11 AM  Result Value Ref Range Status   Specimen Description URINE, CATHETERIZED  Final   Special Requests NONE  Final   Culture MULTIPLE SPECIES PRESENT, SUGGEST RECOLLECTION (A)  Final   Report Status 10/03/2016 FINAL  Final  Blood Culture (routine x 2)     Status: None (Preliminary result)   Collection Time: 10/01/16  9:15 AM  Result Value Ref Range Status   Specimen Description LEFT ANTECUBITAL  Final   Special Requests   Final    BOTTLES DRAWN AEROBIC AND ANAEROBIC Blood Culture adequate volume   Culture NO GROWTH 2 DAYS  Final   Report Status PENDING  Incomplete  MRSA PCR Screening     Status: None   Collection Time: 10/01/16  8:26 PM  Result Value Ref Range Status   MRSA by PCR NEGATIVE NEGATIVE Final    Comment:        The GeneXpert MRSA Assay (FDA approved for NASAL specimens only), is one component of a comprehensive MRSA colonization surveillance program. It is not intended to diagnose MRSA infection nor to guide or monitor treatment for MRSA infections.     Radiology Reports Dg Chest Port 1 View  Result Date: 10/02/2016 CLINICAL DATA:  Respiratory failure. EXAM: PORTABLE CHEST 1 VIEW COMPARISON:  10/01/2016 FINDINGS: Low lung volumes with vascular congestion, interstitial prominence and bibasilar atelectasis. Question mild interstitial edema. Heart is mildly enlarged, likely accentuated by the low volumes. IMPRESSION: Vascular congestion with interstitial prominence, question interstitial edema. Low volumes with bibasilar atelectasis. Electronically Signed   By: Rolm Baptise M.D.    On: 10/02/2016 09:53   Dg Chest Port 1 View  Result Date: 10/01/2016 CLINICAL DATA:  Altered mental status, history of Graves disease, shortness of breath EXAM: PORTABLE CHEST 1 VIEW COMPARISON:  04/08/2012 FINDINGS: Bilateral diffuse mild interstitial thickening. Linear band of airspace disease in the lingula likely reflecting atelectasis. No pleural effusion or pneumothorax. Stable cardiomediastinal silhouette. No acute osseous abnormality. IMPRESSION: 1. Bilateral diffuse mild interstitial thickening which may reflect mild interstitial edema versus infection. Electronically Signed   By: Kathreen Devoid   On: 10/01/2016 09:51    Time Spent in minutes  25   Louellen Molder M.D on 10/03/2016 at 11:56 AM  Between 7am to 7pm - Pager - [phone]  After 7pm go  to [email or web address] - password [facility]  [facility] -  Office  [phone]

## 2016-10-04 DIAGNOSIS — G9341 Metabolic encephalopathy: Secondary | ICD-10-CM | POA: Diagnosis present

## 2016-10-04 DIAGNOSIS — J189 Pneumonia, unspecified organism: Secondary | ICD-10-CM

## 2016-10-04 DIAGNOSIS — J9601 Acute respiratory failure with hypoxia: Secondary | ICD-10-CM | POA: Diagnosis present

## 2016-10-04 DIAGNOSIS — A419 Sepsis, unspecified organism: Secondary | ICD-10-CM | POA: Diagnosis present

## 2016-10-04 NOTE — Progress Notes (Signed)
PROGRESS NOTE    Mark Kramer  ASN:[id number] DOB: [birth year] DOA: 10/01/2016 PCP: Caprice Renshaw, MD    Brief Narrative:  [name] a 56 y.o.male with multiple sclerosis and wheelchair-bound who is a resident of a nursing center and was sent to the ED with reported confusion,  fever of 102F and  hypoxia with O2 sats dropping to the 80s. SNF  nursing staff reported that he was not responding well and was lethargic. History was obtained by ED physician as the  patient unable to provide any details.  There were no reported recent illness, change in medications or falls documented. No history of nausea, vomiting, blurred vision, chest pain, shortness of breath, abdominal pain, diarrhea or dysuria. Patient was placed on 4 L O2 via nasal cannula and brought to the ED. In the ED patient was febrile with a temperature of 102.1F and tachypneic. Blood work showed WBC of 10.4, normal hemoglobin and platelets and chemistry. Lactic acid was normal. ABG showed pH of 7.43, PCO2 of 36 and PO2 of 68. His chest x-ray revealed bilateral diffuse interstitial thickening concerning for infection. Patient was given 3 L IV normal saline bolus per sepsis protocol. He was admitted for further evaluation and management.   Assessment & Plan:   Principal Problem:   Sepsis due to pneumonia [facility]) Active Problems:   Acute metabolic encephalopathy   Acute respiratory failure with hypoxia (HCC)   Multiple sclerosis (HCC)   Tremor   Cognitive dysfunction   Pressure injury of skin   Decubitus ulcer of right buttock, stage 1   1. Sepsis secondary to pneumonia causing respiratory failure with hypoxia. Chest x-ray noted on admission for bilateral infiltrates. -Patient was started on IV Rocephin and azithromycin. IV fluid volume resuscitation was given. Supportive treatment was started. -Blood cultures were ordered. One out of 2 became positive for coagulase-negative Staphylococcus, likely a contaminant. His MRSA  screen was also negative. -HIV antibody was negative. Influenza panel was negative. -The patient's blood pressure and hemodynamics have improved. His oxygen saturation has improved. -His fever curve is improving. His white blood cell count has been within normal limits and has improved overall from 10.4.  Acute metabolic encephalopathy. Per report, the patient was more lethargic and confused at the SNF. His baseline is unclear per my examination, but his answers appear to be appropriate but slow and he was able to follow small commands. -Ammonia was within normal limits. -He is likely back to baseline. -Etiology was likely sepsis, fever, pneumonia.  Dysphagia. Given the look of the chest x-ray, aspiration was a consideration. Speech therapy was consulted and reported mild dysphagia necessitating a dysphagia 3 diet with thin liquids. Aspiration pneumonia is not clinically determined. -He is improving clinically on the antibiotics above.  Multiple sclerosis. Patient appears to be treated chronically with primidone. It was continued. He is noted to have a moderate tremor of his arms/hands bilaterally. It is unclear if the tremor is from multiple sclerosis, Risperdal, or other. This appears to be chronic. Will defer follow-up with his neurologist to his PCP at the SNF.  Hypertension. Patient is treated chronically with furosemide Nadolol, and amlodipine. Lasix was held initially due to sepsis. It was restarted following flash pulmonary edema from volume resuscitation. His blood pressure is now modestly elevated. We'll continue to monitor her without making any changes for now.  Acute pulmonary edema. Patient developed questionable interstitial edema per follow-up chest x-ray 10/02/16. This was likely secondary to IV fluid resuscitation. IV fluids were discontinued. He was  given 1 dose of IV Lasix and subsequently started on his home dose. -Clinically it appears to be  resolved.  Hypothyroidism. Patient was restarted on Synthroid. Will order TSH.    DVT prophylaxis: Lovenox Code Status: Full code Family Communication: Pending; family currently not present Disposition Plan: Discharge to SNF when clinically appropriate, likely in the next 24-48 hours.   Consultants:   None  Procedures:   None  Antimicrobials:   Rocephin 9/18>>  Azithromycin 9/18>>    Subjective: (Patient was seen at breakfast). He is sitting up in bed and being assisted with eating. He denies pain or shortness of breath.  Objective: Vitals:   10/03/16 0520 10/03/16 1314 10/03/16 2059 10/04/16 0550  BP: (!) 107/53 137/74 (!) 145/96 (!) 138/105  Pulse: (!) 56 (!) 59 68 61  Resp: 20 20 20 20   Temp: 98.7 F (37.1 C) 98.9 F (37.2 C) 99.4 F (37.4 C) 99.7 F (37.6 C)  TempSrc: Oral Oral Oral Oral  SpO2: 100% 100% 96% 99%  Weight:      Height:        Intake/Output Summary (Last 24 hours) at 10/04/16 1440 Last data filed at 10/04/16 0553  Gross per 24 hour  Intake                0 ml  Output              200 ml  Net             -200 ml   Filed Weights   10/01/16 0907 10/01/16 1535  Weight: 94.3 kg (208 lb) 99.9 kg (220 lb 3.2 oz)    Examination:  General exam: Chronically debilitating appearing African-American man in no acute distress.  Respiratory system: Globally decreased breath sounds; weak effort. Respiratory effort normal. Cardiovascular system: S1 & S2 with soft systolic murmur. No pedal edema. Gastrointestinal system: Abdomen is mildly obese nondistended, soft and nontender. No organomegaly or masses felt. Normal bowel sounds heard. Central nervous system: Alert and oriented. Noted bilateral upper extremity rotary tremor. Patient unable to lift both legs against gravity. He has a weak bilateral handgrip. Extremities: No acute hot red joints. Skin: No rashes, lesions or ulcers Psychiatry: Judgement and insight appear normal. Mood & affect appear  flat.    Data Reviewed: I have personally reviewed following labs and imaging studies  CBC:  Recent Labs Lab 10/01/16 0915 10/02/16 0631 10/03/16 1540  WBC 10.4 7.8 6.0  NEUTROABS 9.6*  --   --   HGB 16.0 15.0 16.1  HCT 48.9 45.9 48.1  MCV 85.9 86.6 86.7  PLT 207 223 818   Basic Metabolic Panel:  Recent Labs Lab 10/01/16 0915 10/02/16 0631 10/03/16 1540  NA 140 138 136  K 4.1 4.0 3.8  CL 102 103 99*  CO2 28 24 27   GLUCOSE 114* 92 97  BUN 18 16 11   CREATININE 0.89 0.79 0.77  CALCIUM 9.4 8.9 9.2   GFR: Estimated Creatinine Clearance: 129.7 mL/min (by C-G formula based on SCr of 0.77 mg/dL). Liver Function Tests:  Recent Labs Lab 10/01/16 0915  AST 54*  ALT 27  ALKPHOS 122  BILITOT 1.1  PROT 7.6  ALBUMIN 3.6   No results for input(s): LIPASE, AMYLASE in the last 168 hours.  Recent Labs Lab 10/03/16 1540  AMMONIA 33   Coagulation Profile: No results for input(s): INR, PROTIME in the last 168 hours. Cardiac Enzymes:  Recent Labs Lab 10/01/16 0915  TROPONINI <0.03  BNP (last 3 results) No results for input(s): PROBNP in the last 8760 hours. HbA1C: No results for input(s): HGBA1C in the last 72 hours. CBG:  Recent Labs Lab 10/01/16 0915 10/03/16 0309 10/03/16 0338  GLUCAP 111* 69 84   Lipid Profile: No results for input(s): CHOL, HDL, LDLCALC, TRIG, CHOLHDL, LDLDIRECT in the last 72 hours. Thyroid Function Tests: No results for input(s): TSH, T4TOTAL, FREET4, T3FREE, THYROIDAB in the last 72 hours. Anemia Panel: No results for input(s): VITAMINB12, FOLATE, FERRITIN, TIBC, IRON, RETICCTPCT in the last 72 hours. Sepsis Labs:  Recent Labs Lab 10/01/16 0921 10/01/16 1308  LATICACIDVEN 1.80 0.92    Recent Results (from the past 240 hour(s))  Blood Culture (routine x 2)     Status: Abnormal (Preliminary result)   Collection Time: 10/01/16  9:10 AM  Result Value Ref Range Status   Specimen Description BLOOD RIGHT FOREARM  Final    Special Requests   Final    BOTTLES DRAWN AEROBIC AND ANAEROBIC Blood Culture adequate volume   Culture  Setup Time   Final    GROWTH IN AEROBIC BOTTLE GRAM POSITIVE COCCI IN CLUSTERS Gram Stain Report Called to,Read Back By and Verified With: AMY HOBBS AT 2703 BY HFLYNT 10/02/16 CRITICAL RESULT CALLED TO, READ BACK BY AND VERIFIED WITH: M HAYES 10/03/16 @ 43 M VESTAL    Culture (A)  Final    STAPHYLOCOCCUS SPECIES (COAGULASE NEGATIVE) THE SIGNIFICANCE OF ISOLATING THIS ORGANISM FROM A SINGLE SET OF BLOOD CULTURES WHEN MULTIPLE SETS ARE DRAWN IS UNCERTAIN. PLEASE NOTIFY THE MICROBIOLOGY DEPARTMENT WITHIN ONE WEEK IF SPECIATION AND SENSITIVITIES ARE REQUIRED. Performed at [facility], [place] [address], [place], [place] [address]    Report Status PENDING  Incomplete  Blood Culture ID Panel (Reflexed)     Status: Abnormal   Collection Time: 10/01/16  9:10 AM  Result Value Ref Range Status   Enterococcus species NOT DETECTED NOT DETECTED Final   Listeria monocytogenes NOT DETECTED NOT DETECTED Final   Staphylococcus species DETECTED (A) NOT DETECTED Final    Comment: Methicillin (oxacillin) susceptible coagulase negative staphylococcus. Possible blood culture contaminant (unless isolated from more than one blood culture draw or clinical case suggests pathogenicity). No antibiotic treatment is indicated for blood  culture contaminants. CRITICAL RESULT CALLED TO, READ BACK BY AND VERIFIED WITH: M HAYES 10/03/16 @ 74 M VESTAL    Staphylococcus aureus NOT DETECTED NOT DETECTED Final   Methicillin resistance NOT DETECTED NOT DETECTED Final   Streptococcus species NOT DETECTED NOT DETECTED Final   Streptococcus agalactiae NOT DETECTED NOT DETECTED Final   Streptococcus pneumoniae NOT DETECTED NOT DETECTED Final   Streptococcus pyogenes NOT DETECTED NOT DETECTED Final   Acinetobacter baumannii NOT DETECTED NOT DETECTED Final   Enterobacteriaceae species NOT DETECTED NOT DETECTED Final    Enterobacter cloacae complex NOT DETECTED NOT DETECTED Final   Escherichia coli NOT DETECTED NOT DETECTED Final   Klebsiella oxytoca NOT DETECTED NOT DETECTED Final   Klebsiella pneumoniae NOT DETECTED NOT DETECTED Final   Proteus species NOT DETECTED NOT DETECTED Final   Serratia marcescens NOT DETECTED NOT DETECTED Final   Haemophilus influenzae NOT DETECTED NOT DETECTED Final   Neisseria meningitidis NOT DETECTED NOT DETECTED Final   Pseudomonas aeruginosa NOT DETECTED NOT DETECTED Final   Candida albicans NOT DETECTED NOT DETECTED Final   Candida glabrata NOT DETECTED NOT DETECTED Final   Candida krusei NOT DETECTED NOT DETECTED Final   Candida parapsilosis NOT DETECTED NOT DETECTED Final  Candida tropicalis NOT DETECTED NOT DETECTED Final    Comment: Performed at [facility], [place] [address] [place], [place] [address]  Urine culture     Status: Abnormal   Collection Time: 10/01/16  9:11 AM  Result Value Ref Range Status   Specimen Description URINE, CATHETERIZED  Final   Special Requests NONE  Final   Culture MULTIPLE SPECIES PRESENT, SUGGEST RECOLLECTION (A)  Final   Report Status 10/03/2016 FINAL  Final  Blood Culture (routine x 2)     Status: None (Preliminary result)   Collection Time: 10/01/16  9:15 AM  Result Value Ref Range Status   Specimen Description LEFT ANTECUBITAL  Final   Special Requests   Final    BOTTLES DRAWN AEROBIC AND ANAEROBIC Blood Culture adequate volume   Culture NO GROWTH 3 DAYS  Final   Report Status PENDING  Incomplete  MRSA PCR Screening     Status: None   Collection Time: 10/01/16  8:26 PM  Result Value Ref Range Status   MRSA by PCR NEGATIVE NEGATIVE Final    Comment:        The GeneXpert MRSA Assay (FDA approved for NASAL specimens only), is one component of a comprehensive MRSA colonization surveillance program. It is not intended to diagnose MRSA infection nor to guide or monitor treatment for MRSA infections.           Radiology Studies: No results found.      Scheduled Meds: . amLODipine  10 mg Oral Daily  . atorvastatin  80 mg Oral Daily  . calcium carbonate  1 tablet Oral BID  . cholecalciferol  1,000 Units Oral Daily  . enoxaparin (LOVENOX) injection  40 mg Subcutaneous Q24H  . furosemide  20 mg Oral Daily  . levothyroxine  112 mcg Oral QAC breakfast  . multivitamin with minerals  1 tablet Oral Daily  . nadolol  80 mg Oral BID  . primidone  100 mg Oral QHS  . primidone  50 mg Oral Daily  . risperiDONE  3 mg Oral BID   Continuous Infusions: . azithromycin Stopped (10/04/16 1006)  . cefTRIAXone (ROCEPHIN)  IV Stopped (10/04/16 0936)     LOS: 3 days    Time spent: 68 minutes    Rexene Alberts, MD [facility] Pager [phone]  If 7PM-7AM, please contact night-coverage [email or web address] Password [email or web address] 10/04/2016, 2:40 PM

## 2016-10-04 NOTE — Progress Notes (Signed)
RN at this time spoke with Dr Darrick Meigs about her concerns with patients ability to swallow pills effectively . After administering pills whole in applesauce patient continues to have periods of forceful coughing. Lungs noted to be coarse at this time. Orders given to initiate NPO status and re-order another speech evaluation. RN will continue to monitor patient closely. Vitals as follows temp 99.0 BP 139/77 RR 20 O2 100% on 2L

## 2016-10-05 ENCOUNTER — Inpatient Hospital Stay (HOSPITAL_COMMUNITY): Payer: Medicare Other

## 2016-10-05 DIAGNOSIS — R1310 Dysphagia, unspecified: Secondary | ICD-10-CM

## 2016-10-05 LAB — BASIC METABOLIC PANEL
Anion gap: 8 (ref 5–15)
BUN: 7 mg/dL (ref 6–20)
CHLORIDE: 103 mmol/L (ref 101–111)
CO2: 28 mmol/L (ref 22–32)
CREATININE: 0.69 mg/dL (ref 0.61–1.24)
Calcium: 9.3 mg/dL (ref 8.9–10.3)
GFR calc Af Amer: >60 mL/min (ref >60)
GFR calc non Af Amer: >60 mL/min (ref >60)
GLUCOSE: 106 mg/dL — AB (ref 65–99)
Potassium: 4 mmol/L (ref 3.5–5.1)
SODIUM: 139 mmol/L (ref 135–145)

## 2016-10-05 LAB — CULTURE, BLOOD (ROUTINE X 2): SPECIAL REQUESTS: ADEQUATE

## 2016-10-05 LAB — TSH: TSH: 4.857 u[IU]/mL — ABNORMAL HIGH (ref 0.350–4.500)

## 2016-10-05 LAB — T4, FREE: Free T4: 1.17 ng/dL — ABNORMAL HIGH (ref 0.61–1.12)

## 2016-10-05 MED ORDER — HALOPERIDOL LACTATE 5 MG/ML IJ SOLN
1.0000 mg | Freq: Two times a day (BID) | INTRAMUSCULAR | Status: DC
  Administered 2016-10-05 – 2016-10-08 (×6): 1 mg via INTRAMUSCULAR
  Filled 2016-10-05 (×6): qty 1

## 2016-10-05 MED ORDER — METOPROLOL TARTRATE 5 MG/5ML IV SOLN
5.0000 mg | INTRAVENOUS | Status: DC
Start: 2016-10-05 — End: 2016-10-08
  Administered 2016-10-05 – 2016-10-08 (×13): 5 mg via INTRAVENOUS
  Filled 2016-10-05 (×15): qty 5

## 2016-10-05 MED ORDER — LEVOTHYROXINE SODIUM 100 MCG IV SOLR
60.0000 ug | Freq: Every day | INTRAVENOUS | Status: DC
  Administered 2016-10-06 – 2016-10-08 (×2): 60 ug via INTRAVENOUS
  Filled 2016-10-05 (×5): qty 5

## 2016-10-05 MED ORDER — FUROSEMIDE 10 MG/ML IJ SOLN
20.0000 mg | Freq: Every day | INTRAMUSCULAR | Status: DC
Start: 2016-10-05 — End: 2016-10-08
  Administered 2016-10-05 – 2016-10-08 (×3): 20 mg via INTRAVENOUS
  Filled 2016-10-05 (×3): qty 2

## 2016-10-05 MED ORDER — KCL IN DEXTROSE-NACL 20-5-0.9 MEQ/L-%-% IV SOLN
INTRAVENOUS | Status: DC
  Administered 2016-10-05 – 2016-10-08 (×5): via INTRAVENOUS

## 2016-10-05 NOTE — Progress Notes (Signed)
PROGRESS NOTE    Mark Kramer  UEA:[id number] DOB: 1960/06/03 DOA: 10/01/2016 PCP: Caprice Renshaw, MD    Brief Narrative:  [name] a 56 y.o.male with multiple sclerosis and wheelchair-bound who is a resident of a nursing center and was sent to the ED with reported confusion,  fever of 102F and  hypoxia with O2 sats dropping to the 80s. SNF  nursing staff reported that he was not responding well and was lethargic. History was obtained by ED physician as the  patient unable to provide any details.  There were no reported recent illness, change in medications or falls documented. No history of nausea, vomiting, blurred vision, chest pain, shortness of breath, abdominal pain, diarrhea or dysuria. Patient was placed on 4 L O2 via nasal cannula and brought to the ED. In the ED patient was febrile with a temperature of 102.22F and tachypneic. Blood work showed WBC of 10.4, normal hemoglobin and platelets and chemistry. Lactic acid was normal. ABG showed pH of 7.43, PCO2 of 36 and PO2 of 68. His chest x-ray revealed bilateral diffuse interstitial thickening concerning for infection. Patient was given 3 L IV normal saline bolus per sepsis protocol. He was admitted for further evaluation and management.   Assessment & Plan:   Principal Problem:   Sepsis due to pneumonia [facility]) Active Problems:   Acute metabolic encephalopathy   Acute respiratory failure with hypoxia (HCC)   Multiple sclerosis (HCC)   Tremor   Cognitive dysfunction   Pressure injury of skin   Decubitus ulcer of right buttock, stage 1   1. Sepsis secondary to pneumonia causing respiratory failure with hypoxia. Chest x-ray noted on admission for bilateral infiltrates. -Patient was started on IV Rocephin and azithromycin. IV fluid volume resuscitation was given. Supportive treatment was started. -Blood cultures were ordered. One out of 2 became positive for coagulase-negative Staphylococcus, likely a contaminant. His MRSA  screen was also negative. -HIV antibody was negative. Influenza panel was negative. -The patient's blood pressure and hemodynamics have improved. His oxygen saturation has improved. -His fever curve has improved. His white blood cell count has been within normal limits and has improved overall from 10.4. -We'll order a follow-up chest x-ray to assess for interval changes.  Acute metabolic encephalopathy. Per report, the patient was more lethargic and confused at the SNF.  -Ammonia was within normal limits. -He appears more alert and interactive today, which is likely active baseline. -Etiology is unclear, but could be secondary to sepsis however, given his intermittent altered mental status, will order a head CT and EEG. -We'll give Haldol 1 mg IM twice a day instead of oral Risperdal  due to nothing by mouth status  Dysphagia. Given the look of the chest x-ray, aspiration was a consideration. Speech therapy was consulted and per her initial evaluation, reported mild dysphagia necessitating a dysphagia 3 diet with thin liquids. -On the night of 9/22, nursing reported palpitations increasing difficulty taking his pills. Therefore he was made to be nothing by mouth. -We'll continue virtually nothing by mouth status. Will reorder speech therapy consultation. Will order CT scan of his head. -Due to his nothing by mouth status, will start gentle IV fluids with D5 NS.  Multiple sclerosis. Patient appears to be treated chronically with primidone. It was continued. He is noted to have a moderate tremor of his arms/hands bilaterally. It is unclear if the tremor is from multiple sclerosis, Risperdal, or other. This appears to be chronic. Will defer to follow-up with his neurologist to his  PCP at the SNF. -Hold medications today until ST follow-up.  Hypertension. Patient is treated chronically with furosemide Nadolol, and amlodipine. Lasix was held initially due to sepsis. It was restarted following  flash pulmonary edema from volume resuscitation. His blood pressure is now modestly elevated. We'll continue to monitor her without making any changes for now. -Hold medications today until ST follow-up.  -We'll give Lasix IV. We'll give metoprolol IV until nadolol can be restarted.   Acute pulmonary edema. Patient developed questionable interstitial edema per follow-up chest x-ray 10/02/16. This was likely secondary to IV fluid resuscitation. IV fluids were discontinued. He was given 1 dose of IV Lasix and subsequently started on his home dose. -Clinically it appears to be resolved.  Hypothyroidism. Patient was restarted on Synthroid.  -HisTSH was slightly elevated at 4.8 but his free T4 was slightly increased at 1.17. -We'll keep his oral dose the same, but due to nothing by mouth status, will give his Synthroid IV at half the original dose.    DVT prophylaxis: Lovenox Code Status: Full code Family Communication: Pending; family currently not present Disposition Plan: Discharge to SNF when clinically appropriate, likely in the next 24-48 hours.   Consultants:   None  Procedures:   None  Antimicrobials:   Rocephin 9/18>>  Azithromycin 9/18>>    Subjective: Patient is sitting up in bed, he appears more alert. He acknowledges some difficulty swallowing but says is not necessarily new. He denies shortness of breath or chest pain at rest.  Objective: Vitals:   10/04/16 0550 10/04/16 1400 10/04/16 2100 10/05/16 0700  BP: (!) 138/105 (!) 185/81 139/67   Pulse: 61 72 72   Resp: 20 18 17    Temp: 99.7 F (37.6 C) 98.5 F (36.9 C) 99 F (37.2 C)   TempSrc: Oral Oral Oral   SpO2: 99% 100% 100% 95%  Weight:      Height:        Intake/Output Summary (Last 24 hours) at 10/05/16 1324 Last data filed at 10/05/16 0900  Gross per 24 hour  Intake                0 ml  Output             1600 ml  Net            -1600 ml   Filed Weights   10/01/16 0907 10/01/16 1535  Weight:  94.3 kg (208 lb) 99.9 kg (220 lb 3.2 oz)    Examination:  General exam: Chronically debilitating appearing African-American man in no acute distress.  Respiratory system: Globally decreased breath sounds, But a few breakthrough crackles. Respiratory effort normal. Cardiovascular system: S1 & S2 with soft systolic murmur. No pedal edema. Gastrointestinal system: Abdomen is mildly obese nondistended, soft and nontender. No organomegaly or masses felt. Normal bowel sounds heard. Central nervous system: Alert and oriented. Noted bilateral upper extremity rotary tremor left greater than right. Patient unable to lift both legs against gravity. He has a weak bilateral handgrip. Extremities: No acute hot red joints. Skin: No rashes, lesions or ulcers Psychiatry: Judgement and insight appear normal. Mood & affect appear flat.    Data Reviewed: I have personally reviewed following labs and imaging studies  CBC:  Recent Labs Lab 10/01/16 0915 10/02/16 0631 10/03/16 1540  WBC 10.4 7.8 6.0  NEUTROABS 9.6*  --   --   HGB 16.0 15.0 16.1  HCT 48.9 45.9 48.1  MCV 85.9 86.6 86.7  PLT 207 223 257  Basic Metabolic Panel:  Recent Labs Lab 10/01/16 0915 10/02/16 0631 10/03/16 1540 10/05/16 0709  NA 140 138 136 139  K 4.1 4.0 3.8 4.0  CL 102 103 99* 103  CO2 28 24 27 28   GLUCOSE 114* 92 97 106*  BUN 18 16 11 7   CREATININE 0.89 0.79 0.77 0.69  CALCIUM 9.4 8.9 9.2 9.3   GFR: Estimated Creatinine Clearance: 129.7 mL/min (by C-G formula based on SCr of 0.69 mg/dL). Liver Function Tests:  Recent Labs Lab 10/01/16 0915  AST 54*  ALT 27  ALKPHOS 122  BILITOT 1.1  PROT 7.6  ALBUMIN 3.6   No results for input(s): LIPASE, AMYLASE in the last 168 hours.  Recent Labs Lab 10/03/16 1540  AMMONIA 33   Coagulation Profile: No results for input(s): INR, PROTIME in the last 168 hours. Cardiac Enzymes:  Recent Labs Lab 10/01/16 0915  TROPONINI <0.03   BNP (last 3 results) No  results for input(s): PROBNP in the last 8760 hours. HbA1C: No results for input(s): HGBA1C in the last 72 hours. CBG:  Recent Labs Lab 10/01/16 0915 10/03/16 0309 10/03/16 0338  GLUCAP 111* 69 84   Lipid Profile: No results for input(s): CHOL, HDL, LDLCALC, TRIG, CHOLHDL, LDLDIRECT in the last 72 hours. Thyroid Function Tests:  Recent Labs  10/05/16 0709  TSH 4.857*  FREET4 1.17*   Anemia Panel: No results for input(s): VITAMINB12, FOLATE, FERRITIN, TIBC, IRON, RETICCTPCT in the last 72 hours. Sepsis Labs:  Recent Labs Lab 10/01/16 0921 10/01/16 1308  LATICACIDVEN 1.80 0.92    Recent Results (from the past 240 hour(s))  Blood Culture (routine x 2)     Status: Abnormal   Collection Time: 10/01/16  9:10 AM  Result Value Ref Range Status   Specimen Description BLOOD RIGHT FOREARM  Final   Special Requests   Final    BOTTLES DRAWN AEROBIC AND ANAEROBIC Blood Culture adequate volume   Culture  Setup Time   Final    GROWTH IN AEROBIC BOTTLE GRAM POSITIVE COCCI IN CLUSTERS Gram Stain Report Called to,Read Back By and Verified With: AMY HOBBS AT 2778 BY HFLYNT 10/02/16 CRITICAL RESULT CALLED TO, READ BACK BY AND VERIFIED WITH: M HAYES 10/03/16 @ 66 M VESTAL    Culture (A)  Final    STAPHYLOCOCCUS SPECIES (COAGULASE NEGATIVE) THE SIGNIFICANCE OF ISOLATING THIS ORGANISM FROM A SINGLE SET OF BLOOD CULTURES WHEN MULTIPLE SETS ARE DRAWN IS UNCERTAIN. PLEASE NOTIFY THE MICROBIOLOGY DEPARTMENT WITHIN ONE WEEK IF SPECIATION AND SENSITIVITIES ARE REQUIRED. Performed at [facility], [place] [address], [place], [place] [address]    Report Status 10/05/2016 FINAL  Final  Blood Culture ID Panel (Reflexed)     Status: Abnormal   Collection Time: 10/01/16  9:10 AM  Result Value Ref Range Status   Enterococcus species NOT DETECTED NOT DETECTED Final   Listeria monocytogenes NOT DETECTED NOT DETECTED Final   Staphylococcus species DETECTED (A) NOT DETECTED Final    Comment:  Methicillin (oxacillin) susceptible coagulase negative staphylococcus. Possible blood culture contaminant (unless isolated from more than one blood culture draw or clinical case suggests pathogenicity). No antibiotic treatment is indicated for blood  culture contaminants. CRITICAL RESULT CALLED TO, READ BACK BY AND VERIFIED WITH: M HAYES 10/03/16 @ 0736 M VESTAL    Staphylococcus aureus NOT DETECTED NOT DETECTED Final   Methicillin resistance NOT DETECTED NOT DETECTED Final   Streptococcus species NOT DETECTED NOT DETECTED Final   Streptococcus agalactiae NOT DETECTED NOT DETECTED  Final   Streptococcus pneumoniae NOT DETECTED NOT DETECTED Final   Streptococcus pyogenes NOT DETECTED NOT DETECTED Final   Acinetobacter baumannii NOT DETECTED NOT DETECTED Final   Enterobacteriaceae species NOT DETECTED NOT DETECTED Final   Enterobacter cloacae complex NOT DETECTED NOT DETECTED Final   Escherichia coli NOT DETECTED NOT DETECTED Final   Klebsiella oxytoca NOT DETECTED NOT DETECTED Final   Klebsiella pneumoniae NOT DETECTED NOT DETECTED Final   Proteus species NOT DETECTED NOT DETECTED Final   Serratia marcescens NOT DETECTED NOT DETECTED Final   Haemophilus influenzae NOT DETECTED NOT DETECTED Final   Neisseria meningitidis NOT DETECTED NOT DETECTED Final   Pseudomonas aeruginosa NOT DETECTED NOT DETECTED Final   Candida albicans NOT DETECTED NOT DETECTED Final   Candida glabrata NOT DETECTED NOT DETECTED Final   Candida krusei NOT DETECTED NOT DETECTED Final   Candida parapsilosis NOT DETECTED NOT DETECTED Final   Candida tropicalis NOT DETECTED NOT DETECTED Final    Comment: Performed at [facility], [place] [address], [place], [place] [address]  Urine culture     Status: Abnormal   Collection Time: 10/01/16  9:11 AM  Result Value Ref Range Status   Specimen Description URINE, CATHETERIZED  Final   Special Requests NONE  Final   Culture MULTIPLE SPECIES PRESENT, SUGGEST RECOLLECTION  (A)  Final   Report Status 10/03/2016 FINAL  Final  Blood Culture (routine x 2)     Status: None (Preliminary result)   Collection Time: 10/01/16  9:15 AM  Result Value Ref Range Status   Specimen Description LEFT ANTECUBITAL  Final   Special Requests   Final    BOTTLES DRAWN AEROBIC AND ANAEROBIC Blood Culture adequate volume   Culture NO GROWTH 4 DAYS  Final   Report Status PENDING  Incomplete  MRSA PCR Screening     Status: None   Collection Time: 10/01/16  8:26 PM  Result Value Ref Range Status   MRSA by PCR NEGATIVE NEGATIVE Final    Comment:        The GeneXpert MRSA Assay (FDA approved for NASAL specimens only), is one component of a comprehensive MRSA colonization surveillance program. It is not intended to diagnose MRSA infection nor to guide or monitor treatment for MRSA infections.          Radiology Studies: No results found.      Scheduled Meds: . amLODipine  10 mg Oral Daily  . atorvastatin  80 mg Oral Daily  . calcium carbonate  1 tablet Oral BID  . cholecalciferol  1,000 Units Oral Daily  . enoxaparin (LOVENOX) injection  40 mg Subcutaneous Q24H  . furosemide  20 mg Oral Daily  . levothyroxine  112 mcg Oral QAC breakfast  . multivitamin with minerals  1 tablet Oral Daily  . nadolol  80 mg Oral BID  . primidone  100 mg Oral QHS  . primidone  50 mg Oral Daily  . risperiDONE  3 mg Oral BID   Continuous Infusions: . azithromycin Stopped (10/05/16 1045)  . cefTRIAXone (ROCEPHIN)  IV Stopped (10/05/16 1015)     LOS: 4 days    Time spent: 54 minutes    Rexene Alberts, MD [facility] Pager [phone]  If 7PM-7AM, please contact night-coverage [email or web address] Password [email or web address] 10/05/2016, 1:24 PM

## 2016-10-06 ENCOUNTER — Inpatient Hospital Stay (HOSPITAL_COMMUNITY)
Admit: 2016-10-06 | Discharge: 2016-10-06 | Disposition: A | Discharge status: Home / Self Care | Payer: Medicare Other | Attending: Neurology | Admitting: Neurology

## 2016-10-06 DIAGNOSIS — R569 Unspecified convulsions: Secondary | ICD-10-CM | POA: Diagnosis not present

## 2016-10-06 LAB — CULTURE, BLOOD (ROUTINE X 2)
Culture: NO GROWTH
SPECIAL REQUESTS: ADEQUATE

## 2016-10-06 NOTE — Progress Notes (Signed)
  Speech Language Pathology Treatment: Dysphagia  Patient Details Name: Akito Boomhower MRN: [id number] DOB: 05-22-1960 Today's Date: 10/06/2016 Time: 0100-7121 SLP Time Calculation (min) (ACUTE ONLY): 38 min  Assessment / Plan / Recommendation Clinical Impression  Pt with white coating along tongue- appears to have thrush. SLP completed extensive oral care (Pt has been NPO since Saturday and did not have oral care products in room). Pt rousable and able to follow directions. Pt with signs of decreased airway protection characterized by wet vocal quality and difficulty managing secretions. Pt presented with ice chips and sips water only. Recommend MBSS tomorrow. SLP to follow. Pt and mother in agreement with plan of care.   HPI HPI: [name] a 56 y.o.male with history of multiple sclerosis, wheelchair-bound who is a resident of a nursing center ([facility]) and was sent to the ED this morning with fever of 102F, O2 sat drop to the 80s and being encephalopathic. Was not responding well and was lethargic. No recent illness, change in medications or fall documented. No history of nausea, vomiting, blurred vision, chest pain, shortness of breath, abdominal pain, diarrhea or dysuria.      SLP Plan  MBS (on Tuesday)       Recommendations  Diet recommendations: NPO Medication Administration: Crushed with puree                Oral Care Recommendations: Oral care QID;Staff/trained caregiver to provide oral care Follow up Recommendations: Skilled Nursing facility SLP Visit Diagnosis: Dysphagia, unspecified (R13.10) Plan: MBS (on Tuesday)       Thank you,  Genene Churn, [place]                 [place] 10/06/2016, 6:45 PM

## 2016-10-06 NOTE — Progress Notes (Signed)
EEG completed; results pending.    

## 2016-10-06 NOTE — Progress Notes (Signed)
PROGRESS NOTE                                                                                                                                                                                                             Patient Demographics:    Mark Kramer, is a 56 y.o. male, DOB - 1960/02/26, UXL:[id number]  Admit date - 10/01/2016   Admitting Physician No admitting provider for patient encounter.  Outpatient Primary MD for the patient is Caprice Renshaw, MD  LOS - 5  Outpatient Specialists: Dr Felecia Shelling  No chief complaint on file.      Brief Narrative   56 y.o.male with multiple sclerosis and wheelchair-bound who is a resident of a nursing center and was sent to the ED with reported confusion,  fever of 102F and  hypoxia with O2 sats dropping to the 80s. SNF  nursing staff reported that he was not responding well and was lethargic. History was obtained by ED physician as the  patient unable to provide any details.  There were no reported recent illness, change in medications or falls documented. No history of nausea, vomiting, blurred vision, chest pain, shortness of breath, abdominal pain, diarrhea or dysuria. Patient was placed on 4 L O2 via nasal cannula and brought to the ED. In the ED patient was febrile with a temperature of 102.12F and tachypneic. Blood work showed WBC of 10.4, normal hemoglobin and platelets and chemistry. Lactic acid was normal. ABG showed pH of 7.43, PCO2 of 36 and PO2 of 68. His chest x-ray revealed bilateral diffuse interstitial thickening concerning for infection. Patient was given 3 L IV normal saline bolus per sepsis protocol. He was admitted for further evaluation and management   Subjective:    remains afebrile. Seems more oriented and answering questions appropriately today.   Assessment  & Plan :    Principal Problem:   Sepsis due to pneumonia ([place]) Sepsis resolved. Continue empiric  antibiotics. Blood culture, flu PCR negative.  Active Problems: Acute metabolic encephalopathy Episodes of altered mental status despite resolution of sepsis. Repeat head CT unchanged. EEG done and results pending. Repeat SLP eval pending. Mental status appears much improved today. Will monitor overnight. If altered again I will obtain an MRI of the brain.     Multiple sclerosis ([place]) On chronic primidone. Has moderate tremors of his hands  bilaterally. Follow up with   Tremor his neurologist as outpatient.     Decubitus ulcer of right buttock, stage 1  care per nursing.     Acute respiratory failure with hypoxia (HCC) Secondary to pneumonia. Resolved.  dysphagia Placed on dysphagia level III diet after swallow evaluation. however on 9/22 patient had difficulty taking his pills and thus was made nothing by mouth with repeat SLP evaluation.      Code Status : Full code  Family Communication  : None at bedside  Disposition Plan  : SNF possibly tomorrow if EEG normal, pending SLP eval and normal further change in mental status overnight  Barriers For Discharge : Improving symptoms  Consults  :  None  Procedures  :  CT head EEG  DVT Prophylaxis  :  Lovenox -   Lab Results  Component Value Date   PLT 257 10/03/2016    Antibiotics  :   Anti-infectives    Start     Dose/Rate Route Frequency Ordered Stop   10/02/16 1000  cefTRIAXone (ROCEPHIN) 1 g in dextrose 5 % 50 mL IVPB     1 g 100 mL/hr over 30 Minutes Intravenous Every 24 hours 10/02/16 0044     10/02/16 1000  azithromycin (ZITHROMAX) 500 mg in dextrose 5 % 250 mL IVPB     500 mg 250 mL/hr over 60 Minutes Intravenous Every 24 hours 10/02/16 0044     10/01/16 1015  cefTRIAXone (ROCEPHIN) 1 g in dextrose 5 % 50 mL IVPB     1 g 100 mL/hr over 30 Minutes Intravenous  Once 10/01/16 1004 10/01/16 1159   10/01/16 1015  azithromycin (ZITHROMAX) 500 mg in dextrose 5 % 250 mL IVPB     500 mg 250 mL/hr over 60 Minutes  Intravenous  Once 10/01/16 1004 10/01/16 1121        Objective:   Vitals:   10/06/16 0130 10/06/16 0525 10/06/16 0620 10/06/16 0755  BP: 127/68 127/68 129/82 (!) 146/86  Pulse: 60 64 61 65  Resp:   18 15  Temp:   98.9 F (37.2 C) 98.8 F (37.1 C)  TempSrc:   Oral Axillary  SpO2:   97% 97%  Weight:      Height:        Wt Readings from Last 3 Encounters:  10/01/16 99.9 kg (220 lb 3.2 oz)  02/26/16 94.3 kg (208 lb)  02/01/15 94.3 kg (208 lb)     Intake/Output Summary (Last 24 hours) at 10/06/16 1425 Last data filed at 10/06/16 1100  Gross per 24 hour  Intake          2599.83 ml  Output             2300 ml  Net           299.83 ml     Physical Exam  Gen: not in distress HEENT: no pallor, moist mucosa, supple neck Chest: clear b/l, no added sounds CVS: N S1&S2, no murmurs, rubs or gallop GI: soft, NT, ND, BS+ Musculoskeletal: warm, no edema CNS: AAOX3, non focal    Data Review:    CBC  Recent Labs Lab 10/01/16 0915 10/02/16 0631 10/03/16 1540  WBC 10.4 7.8 6.0  HGB 16.0 15.0 16.1  HCT 48.9 45.9 48.1  PLT 207 223 257  MCV 85.9 86.6 86.7  MCH 28.1 28.3 29.0  MCHC 32.7 32.7 33.5  RDW 14.2 14.5 14.2  LYMPHSABS 0.4*  --   --   MONOABS  0.4  --   --   EOSABS 0.0  --   --   BASOSABS 0.0  --   --     Chemistries   Recent Labs Lab 10/01/16 0915 10/02/16 0631 10/03/16 1540 10/05/16 0709  NA 140 138 136 139  K 4.1 4.0 3.8 4.0  CL 102 103 99* 103  CO2 28 24 27 28   GLUCOSE 114* 92 97 106*  BUN 18 16 11 7   CREATININE 0.89 0.79 0.77 0.69  CALCIUM 9.4 8.9 9.2 9.3  AST 54*  --   --   --   ALT 27  --   --   --   ALKPHOS 122  --   --   --   BILITOT 1.1  --   --   --    ------------------------------------------------------------------------------------------------------------------ No results for input(s): CHOL, HDL, LDLCALC, TRIG, CHOLHDL, LDLDIRECT in the last 72 hours.  No results found for:  HGBA1C ------------------------------------------------------------------------------------------------------------------  Recent Labs  10/05/16 0709  TSH 4.857*   ------------------------------------------------------------------------------------------------------------------ No results for input(s): VITAMINB12, FOLATE, FERRITIN, TIBC, IRON, RETICCTPCT in the last 72 hours.  Coagulation profile No results for input(s): INR, PROTIME in the last 168 hours.  No results for input(s): DDIMER in the last 72 hours.  Cardiac Enzymes  Recent Labs Lab 10/01/16 0915  TROPONINI <0.03   ------------------------------------------------------------------------------------------------------------------ No results found for: BNP  Inpatient Medications  Scheduled Meds: . amLODipine  10 mg Oral Daily  . atorvastatin  80 mg Oral Daily  . calcium carbonate  1 tablet Oral BID  . cholecalciferol  1,000 Units Oral Daily  . enoxaparin (LOVENOX) injection  40 mg Subcutaneous Q24H  . furosemide  20 mg Intravenous Daily  . haloperidol lactate  1 mg Intramuscular BID  . levothyroxine  60 mcg Intravenous QAC breakfast  . metoprolol tartrate  5 mg Intravenous Q4H  . multivitamin with minerals  1 tablet Oral Daily  . primidone  100 mg Oral QHS  . primidone  50 mg Oral Daily  . risperiDONE  3 mg Oral BID   Continuous Infusions: . azithromycin Stopped (10/06/16 1100)  . cefTRIAXone (ROCEPHIN)  IV Stopped (10/06/16 1030)  . dextrose 5 % and 0.9 % NaCl with KCl 20 mEq/L 75 mL/hr at 10/06/16 0414   PRN Meds:.acetaminophen **OR** acetaminophen, ondansetron **OR** ondansetron (ZOFRAN) IV, polyethylene glycol  Micro Results Recent Results (from the past 240 hour(s))  Blood Culture (routine x 2)     Status: Abnormal   Collection Time: 10/01/16  9:10 AM  Result Value Ref Range Status   Specimen Description BLOOD RIGHT FOREARM  Final   Special Requests   Final    BOTTLES DRAWN AEROBIC AND ANAEROBIC  Blood Culture adequate volume   Culture  Setup Time   Final    GROWTH IN AEROBIC BOTTLE GRAM POSITIVE COCCI IN CLUSTERS Gram Stain Report Called to,Read Back By and Verified With: AMY HOBBS AT 3295 BY HFLYNT 10/02/16 CRITICAL RESULT CALLED TO, READ BACK BY AND VERIFIED WITH: M HAYES 10/03/16 @ 16 M VESTAL    Culture (A)  Final    STAPHYLOCOCCUS SPECIES (COAGULASE NEGATIVE) THE SIGNIFICANCE OF ISOLATING THIS ORGANISM FROM A SINGLE SET OF BLOOD CULTURES WHEN MULTIPLE SETS ARE DRAWN IS UNCERTAIN. PLEASE NOTIFY THE MICROBIOLOGY DEPARTMENT WITHIN ONE WEEK IF SPECIATION AND SENSITIVITIES ARE REQUIRED. Performed at [facility], [place] [address] [place], [place] [address]    Report Status 10/05/2016 FINAL  Final  Blood Culture ID Panel (Reflexed)  Status: Abnormal   Collection Time: 10/01/16  9:10 AM  Result Value Ref Range Status   Enterococcus species NOT DETECTED NOT DETECTED Final   Listeria monocytogenes NOT DETECTED NOT DETECTED Final   Staphylococcus species DETECTED (A) NOT DETECTED Final    Comment: Methicillin (oxacillin) susceptible coagulase negative staphylococcus. Possible blood culture contaminant (unless isolated from more than one blood culture draw or clinical case suggests pathogenicity). No antibiotic treatment is indicated for blood  culture contaminants. CRITICAL RESULT CALLED TO, READ BACK BY AND VERIFIED WITH: M HAYES 10/03/16 @ 47 M VESTAL    Staphylococcus aureus NOT DETECTED NOT DETECTED Final   Methicillin resistance NOT DETECTED NOT DETECTED Final   Streptococcus species NOT DETECTED NOT DETECTED Final   Streptococcus agalactiae NOT DETECTED NOT DETECTED Final   Streptococcus pneumoniae NOT DETECTED NOT DETECTED Final   Streptococcus pyogenes NOT DETECTED NOT DETECTED Final   Acinetobacter baumannii NOT DETECTED NOT DETECTED Final   Enterobacteriaceae species NOT DETECTED NOT DETECTED Final   Enterobacter cloacae complex NOT DETECTED NOT DETECTED Final    Escherichia coli NOT DETECTED NOT DETECTED Final   Klebsiella oxytoca NOT DETECTED NOT DETECTED Final   Klebsiella pneumoniae NOT DETECTED NOT DETECTED Final   Proteus species NOT DETECTED NOT DETECTED Final   Serratia marcescens NOT DETECTED NOT DETECTED Final   Haemophilus influenzae NOT DETECTED NOT DETECTED Final   Neisseria meningitidis NOT DETECTED NOT DETECTED Final   Pseudomonas aeruginosa NOT DETECTED NOT DETECTED Final   Candida albicans NOT DETECTED NOT DETECTED Final   Candida glabrata NOT DETECTED NOT DETECTED Final   Candida krusei NOT DETECTED NOT DETECTED Final   Candida parapsilosis NOT DETECTED NOT DETECTED Final   Candida tropicalis NOT DETECTED NOT DETECTED Final    Comment: Performed at [facility], [place]. [address] [place], [place] [address]  Urine culture     Status: Abnormal   Collection Time: 10/01/16  9:11 AM  Result Value Ref Range Status   Specimen Description URINE, CATHETERIZED  Final   Special Requests NONE  Final   Culture MULTIPLE SPECIES PRESENT, SUGGEST RECOLLECTION (A)  Final   Report Status 10/03/2016 FINAL  Final  Blood Culture (routine x 2)     Status: None   Collection Time: 10/01/16  9:15 AM  Result Value Ref Range Status   Specimen Description LEFT ANTECUBITAL  Final   Special Requests   Final    BOTTLES DRAWN AEROBIC AND ANAEROBIC Blood Culture adequate volume   Culture NO GROWTH 5 DAYS  Final   Report Status 10/06/2016 FINAL  Final  MRSA PCR Screening     Status: None   Collection Time: 10/01/16  8:26 PM  Result Value Ref Range Status   MRSA by PCR NEGATIVE NEGATIVE Final    Comment:        The GeneXpert MRSA Assay (FDA approved for NASAL specimens only), is one component of a comprehensive MRSA colonization surveillance program. It is not intended to diagnose MRSA infection nor to guide or monitor treatment for MRSA infections.     Radiology Reports Dg Chest 1 View  Result Date: 10/05/2016 CLINICAL DATA:   56 year old male with confusion, fever, difficulty swallowing. Chronic multiple sclerosis. EXAM: CHEST 1 VIEW COMPARISON:  10/02/2016 and earlier. FINDINGS: Supine AP view of the chest at 1414 hours. Similar low lung volumes. Increased platelike opacity in the left mid lung. Continued streaky opacity at the right lung base. Left lung base ventilation may have mildly improved. Stable cardiac  size and mediastinal contours. Visualized tracheal air column is within normal limits. No pneumothorax, pulmonary edema or pleural effusion. Negative visible bowel gas pattern. No acute osseous abnormality identified. IMPRESSION: 1. Continued low lung volumes. Linear increased opacity in the left mid lung appears typical for atelectasis. Continued patchy right lung base opacity which may also be atelectasis, but also consider aspiration in this clinical setting. 2. No pleural effusion identified. Electronically Signed   By: Genevie Ann M.D.   On: 10/05/2016 14:59   Ct Head Wo Contrast  Result Date: 10/05/2016 CLINICAL DATA:  56 year old male with confusion, fever, difficulty swallowing. Chronic multiple sclerosis. EXAM: CT HEAD WITHOUT CONTRAST TECHNIQUE: Contiguous axial images were obtained from the base of the skull through the vertex without intravenous contrast. COMPARISON:  Brain MRI 04/09/2012.  Head CT 04/08/2012. FINDINGS: Brain: Chronically age advanced cerebral volume loss with ex vacuo enlargement of the ventricles. Better demonstrated on the prior MRI, this primarily corresponds to cerebral white matter volume loss, with associated chronic white matter signal abnormality compatible with the given history of chronic demyelinating disease. No midline shift,mass effect, evidence of mass lesion, intracranial hemorrhage or evidence of cortically based acute infarction. Vascular: No suspicious intracranial vascular hyperdensity. Skull: Intact.  No acute osseous abnormality identified. Sinuses/Orbits: Mild sphenoid sinus  mucosal thickening is new since 2014. Other visible paranasal sinuses and mastoids are stable and well pneumatized. Other: Visualized orbits and scalp soft tissues are within normal limits. IMPRESSION: 1. Chronically advanced cerebral white matter volume loss in keeping with severe chronic multiple sclerosis as demonstrated by MRI in 2014. 2.  No acute intracranial abnormality. Electronically Signed   By: Genevie Ann M.D.   On: 10/05/2016 14:57   Dg Chest Port 1 View  Result Date: 10/02/2016 CLINICAL DATA:  Respiratory failure. EXAM: PORTABLE CHEST 1 VIEW COMPARISON:  10/01/2016 FINDINGS: Low lung volumes with vascular congestion, interstitial prominence and bibasilar atelectasis. Question mild interstitial edema. Heart is mildly enlarged, likely accentuated by the low volumes. IMPRESSION: Vascular congestion with interstitial prominence, question interstitial edema. Low volumes with bibasilar atelectasis. Electronically Signed   By: Rolm Baptise M.D.   On: 10/02/2016 09:53   Dg Chest Port 1 View  Result Date: 10/01/2016 CLINICAL DATA:  Altered mental status, history of Graves disease, shortness of breath EXAM: PORTABLE CHEST 1 VIEW COMPARISON:  04/08/2012 FINDINGS: Bilateral diffuse mild interstitial thickening. Linear band of airspace disease in the lingula likely reflecting atelectasis. No pleural effusion or pneumothorax. Stable cardiomediastinal silhouette. No acute osseous abnormality. IMPRESSION: 1. Bilateral diffuse mild interstitial thickening which may reflect mild interstitial edema versus infection. Electronically Signed   By: Kathreen Devoid   On: 10/01/2016 09:51    Time Spent in minutes  25   Louellen Molder M.D on 10/06/2016 at 2:25 PM  Between 7am to 7pm - Pager - [phone]  After 7pm go to [email or web address] - password [facility]  [facility] -  Office  [phone]

## 2016-10-07 ENCOUNTER — Inpatient Hospital Stay (HOSPITAL_COMMUNITY): Payer: Medicare Other

## 2016-10-07 DIAGNOSIS — R251 Tremor, unspecified: Secondary | ICD-10-CM

## 2016-10-07 NOTE — Progress Notes (Signed)
Modified Barium Swallow Progress Note  Patient Details  Name: Mark Kramer MRN: [id number] Date of Birth: 1961-01-07  Today's Date: 10/07/2016  Modified Barium Swallow completed.  Full report located under Chart Review in the Imaging Section.  Brief recommendations include the following:  Clinical Impression  Pt assessed in the lateral position with barium tinged thin (tsp, cup, straw), puree, mech soft, regular textures, and barium tablet with thin liquids. Pt presents with moderate oral phase and mild pharyngeal phase characterized by weak lingual manipulation with lingual pumping and decreased rotary mastication resulting in delayed oral transit and also premature spillage with mixed consistencies. Pt had a single episode of deep penetration before the swallow when taking cup sips of thin liquid (this appeared to fall below the vocal folds and was expelled) and when attempting to swallow the barium tablet with thin liquids (premature spillage and deep penetration of thins while pill still in oral cavity). Of note, Pt with seemingly prominent posterior pharyngeal wall and epiglottic deflection was often slow to return once deflected. Epiglottis often remained deflected during sequential sips. Pt demonstrated improved oral control with straw sip presentations. He required verbal cues to move bolus with tongue and masticate solids during oral phase with solids. Pt also assessed with head forward (ie. Not supported by pillow behind head) which caused tremors. Recommend D3/mech soft and thin liquids via straw sips and po meds whole in puree or crushed as able (Pt will have more difficulty with mixed consistencies). Recommend f/u SLP services for diet tolerance at SNF.   Swallow Evaluation Recommendations       SLP Diet Recommendations: Dysphagia 3 (Mech soft) solids;Thin liquid       Medication Administration: Whole meds with puree   Supervision: Full assist for feeding;Staff to assist with  self feeding;Full supervision/cueing for compensatory strategies   Compensations: Slow rate;Small sips/bites       Oral Care Recommendations: Oral care before and after PO;Staff/trained caregiver to provide oral care   Other Recommendations: Clarify dietary restrictions   Thank you,  Genene Churn, [place] 10/07/2016,8:33 PM

## 2016-10-07 NOTE — Procedures (Signed)
  [place] A. Merlene Laughter, MD     [email or web address]           HISTORY: AMS ? SZ  MEDICATIONS: Scheduled Meds: Continuous Infusions: PRN Meds:.  Prior to Admission medications   Medication Sig Start Date End Date Taking? Authorizing Provider  acetaminophen (TYLENOL) 650 MG CR tablet Take 650 mg by mouth every 8 (eight) hours as needed for pain.    [provider]  ALPRAZolam Duanne Moron) 0.25 MG tablet Take 0.25 mg by mouth daily.    [provider]  amLODipine (NORVASC) 10 MG tablet Take 1 tablet (10 mg total) by mouth daily. 04/13/12   Oswald Hillock, MD  atorvastatin (LIPITOR) 80 MG tablet Take 80 mg by mouth daily.    [provider]  calcium carbonate (TUMS - DOSED IN MG ELEMENTAL CALCIUM) 500 MG chewable tablet Chew 1 tablet by mouth 2 (two) times daily.    [provider]  cholecalciferol (VITAMIN D) 1000 UNITS tablet Take 1,000 Units by mouth daily.     [provider]  DULoxetine (CYMBALTA) 60 MG capsule Take 60 mg by mouth daily.    [provider]  Fingolimod HCl (GILENYA) 0.5 MG CAPS Take 1 capsule by mouth daily. For Multiple Sclerosis    [provider]  furosemide (LASIX) 20 MG tablet Take 20 mg by mouth daily.    [provider]  HYDROcodone-acetaminophen (NORCO/VICODIN) 5-325 MG tablet Take 1 tablet by mouth every 8 (eight) hours as needed for moderate pain.    [provider]  ibuprofen (ADVIL,MOTRIN) 800 MG tablet Take 800 mg by mouth every 8 (eight) hours as needed for mild pain or moderate pain.  06/18/15   [provider]  levothyroxine (SYNTHROID, LEVOTHROID) 112 MCG tablet Take 112 mcg by mouth daily.    [provider]  Multiple Vitamin (MULTIVITAMIN) capsule Take 1 capsule by mouth daily.    [provider]  nadolol (CORGARD) 80 MG tablet Take 80 mg by mouth 2 (two) times daily.     [provider]  polyethylene glycol powder  (GLYCOLAX/MIRALAX) powder  01/18/15   [provider]  primidone (MYSOLINE) 50 MG tablet Take 50 mg by mouth 2 (two) times daily. 50mg  qam & 100mg  qhs    [provider]  risperiDONE (RISPERDAL) 3 MG tablet Take 3 mg by mouth 2 (two) times daily. For depression    [provider]      ANALYSIS: A 16 channel recording using standard 10/20 measurements is conducted for 26 minutes. There is a dominant posterior rhythm of 7 Hz which attenuates with eye opening. There is beta activity seen over the frontal fields. Awake and sleep activities are seen. Stage two NREM sleep with K complexes and spindles is seen. Photic stimulation and hyperventilation did not elicit any abnormal responses. There is no focal slowing, lateralized slowing or epileptiform activity.   IMPRESSION: Mild global slowing. Otherwise, normal study of awake and sleep states      Jaeleen Inzunza A. Merlene Laughter, M.D.  Diplomate, Tax adviser of Psychiatry and Neurology ( Neurology).

## 2016-10-07 NOTE — Progress Notes (Signed)
PROGRESS NOTE                                                                                                                                                                                                             Patient Demographics:    Mark Kramer, is a 56 y.o. male, DOB - [birth year], GEX:[id number]  Admit date - 10/01/2016   Admitting Physician No admitting provider for patient encounter.  Outpatient Primary MD for the patient is Caprice Renshaw, MD  LOS - 6  Outpatient Specialists: Dr Felecia Shelling  No chief complaint on file.      Brief Narrative   56 y.o.male with multiple sclerosis and wheelchair-bound who is a resident of a nursing center and was sent to the ED with reported confusion,  fever of 102F and  hypoxia with O2 sats dropping to the 80s. SNF  nursing staff reported that he was not responding well and was lethargic. History was obtained by ED physician as the  patient unable to provide any details.  There were no reported recent illness, change in medications or falls documented. No history of nausea, vomiting, blurred vision, chest pain, shortness of breath, abdominal pain, diarrhea or dysuria. Patient was placed on 4 L O2 via nasal cannula and brought to the ED. In the ED patient was febrile with a temperature of 102.92F and tachypneic. Blood work showed WBC of 10.4, normal hemoglobin and platelets and chemistry. Lactic acid was normal. ABG showed pH of 7.43, PCO2 of 36 and PO2 of 68. His chest x-ray revealed bilateral diffuse interstitial thickening concerning for infection. Patient was given 3 L IV normal saline bolus per sepsis protocol. He was admitted for further evaluation and management   Subjective:   Continues to remain afebrile and more communicative.   Assessment  & Plan :    Principal Problem:   Sepsis due to pneumonia ([place]) Sepsis resolved. Discontinue antibiotics after today's dose. (Complete  7 days). Flu PCR negative.  Active Problems: Acute metabolic encephalopathy Episodes of altered mental status despite resolution of sepsis. Repeat head CT unchanged. EEG done and and results pending. MBS done today and results pending. Mental status continues to remain at baseline. If EEG normal and no issues with MBS candidate discharged to SNF tomorrow.     Multiple sclerosis ([place]) On chronic primidone. Has moderate tremors of  his hands bilaterally. Follow up with his neurologist as outpatient.     Decubitus ulcer of right buttock, stage 1  care per nursing.     Acute respiratory failure with hypoxia (H[facility]) Secondary to pneumonia. Resolved.  dysphagia Placed on dysphagia level III diet after swallow evaluation. however on 9/22 patient had difficulty taking his pills . Repeat SLP evaluation and MBS today. Resume diet based on results.      Code Status : Full code  Family Communication  : None at bedside  Disposition Plan  : SNF likely tomorrow if EEG normal and no issues with MBS  Barriers For Discharge : Improving symptoms  Consults  :  None  Procedures  :  CT head EEG  DVT Prophylaxis  :  Lovenox -   Lab Results  Component Value Date   PLT 257 10/03/2016    Antibiotics  :   Anti-infectives    Start     Dose/Rate Route Frequency Ordered Stop   10/02/16 1000  cefTRIAXone (ROCEPHIN) 1 g in dextrose 5 % 50 mL IVPB     1 g 100 mL/hr over 30 Minutes Intravenous Every 24 hours 10/02/16 0044     10/02/16 1000  azithromycin (ZITHROMAX) 500 mg in dextrose 5 % 250 mL IVPB     500 mg 250 mL/hr over 60 Minutes Intravenous Every 24 hours 10/02/16 0044     10/01/16 1015  cefTRIAXone (ROCEPHIN) 1 g in dextrose 5 % 50 mL IVPB     1 g 100 mL/hr over 30 Minutes Intravenous  Once 10/01/16 1004 10/01/16 1159   10/01/16 1015  azithromycin (ZITHROMAX) 500 mg in dextrose 5 % 250 mL IVPB     500 mg 250 mL/hr over 60 Minutes Intravenous  Once 10/01/16 1004 10/01/16 1121         Objective:   Vitals:   10/07/16 0152 10/07/16 0500 10/07/16 0622 10/07/16 1500  BP:  (!) 149/63  135/86  Pulse: (!) 56 69 87 60  Resp:  20  20  Temp:  98.4 F (36.9 C)  98.3 F (36.8 C)  TempSrc:  Oral  Oral  SpO2:  96%  96%  Weight:      Height:        Wt Readings from Last 3 Encounters:  10/01/16 99.9 kg (220 lb 3.2 oz)  02/26/16 94.3 kg (208 lb)  02/01/15 94.3 kg (208 lb)     Intake/Output Summary (Last 24 hours) at 10/07/16 1703 Last data filed at 10/07/16 1200  Gross per 24 hour  Intake           1232.5 ml  Output             1500 ml  Net           -267.5 ml     Physical Exam Gen.: Not in distress, debilitated HEENT: Moist mucosa, supple neck Chest: Diminished breath sounds bilaterally CVS: Normal S1 and S2, no murmurs GI: Nondistended, nontender, bowel sounds present Muscular: Warm, no edema CNS: Alert and oriented, resting tremors over bilateral upper extremity. Bedbound.    Data Review:    CBC  Recent Labs Lab 10/01/16 0915 10/02/16 0631 10/03/16 1540  WBC 10.4 7.8 6.0  HGB 16.0 15.0 16.1  HCT 48.9 45.9 48.1  PLT 207 223 257  MCV 85.9 86.6 86.7  MCH 28.1 28.3 29.0  MCHC 32.7 32.7 33.5  RDW 14.2 14.5 14.2  LYMPHSABS 0.4*  --   --   MONOABS  0.4  --   --   EOSABS 0.0  --   --   BASOSABS 0.0  --   --     Chemistries   Recent Labs Lab 10/01/16 0915 10/02/16 0631 10/03/16 1540 10/05/16 0709  NA 140 138 136 139  K 4.1 4.0 3.8 4.0  CL 102 103 99* 103  CO2 28 24 27 28   GLUCOSE 114* 92 97 106*  BUN 18 16 11 7   CREATININE 0.89 0.79 0.77 0.69  CALCIUM 9.4 8.9 9.2 9.3  AST 54*  --   --   --   ALT 27  --   --   --   ALKPHOS 122  --   --   --   BILITOT 1.1  --   --   --    ------------------------------------------------------------------------------------------------------------------ No results for input(s): CHOL, HDL, LDLCALC, TRIG, CHOLHDL, LDLDIRECT in the last 72 hours.  No results found for:  HGBA1C ------------------------------------------------------------------------------------------------------------------  Recent Labs  10/05/16 0709  TSH 4.857*   ------------------------------------------------------------------------------------------------------------------ No results for input(s): VITAMINB12, FOLATE, FERRITIN, TIBC, IRON, RETICCTPCT in the last 72 hours.  Coagulation profile No results for input(s): INR, PROTIME in the last 168 hours.  No results for input(s): DDIMER in the last 72 hours.  Cardiac Enzymes  Recent Labs Lab 10/01/16 0915  TROPONINI <0.03   ------------------------------------------------------------------------------------------------------------------ No results found for: BNP  Inpatient Medications  Scheduled Meds: . amLODipine  10 mg Oral Daily  . atorvastatin  80 mg Oral Daily  . calcium carbonate  1 tablet Oral BID  . cholecalciferol  1,000 Units Oral Daily  . enoxaparin (LOVENOX) injection  40 mg Subcutaneous Q24H  . furosemide  20 mg Intravenous Daily  . haloperidol lactate  1 mg Intramuscular BID  . levothyroxine  60 mcg Intravenous QAC breakfast  . metoprolol tartrate  5 mg Intravenous Q4H  . multivitamin with minerals  1 tablet Oral Daily  . primidone  100 mg Oral QHS  . primidone  50 mg Oral Daily  . risperiDONE  3 mg Oral BID   Continuous Infusions: . azithromycin Stopped (10/06/16 1100)  . cefTRIAXone (ROCEPHIN)  IV Stopped (10/06/16 1030)  . dextrose 5 % and 0.9 % NaCl with KCl 20 mEq/L 75 mL/hr at 10/07/16 1602   PRN Meds:.acetaminophen **OR** acetaminophen, ondansetron **OR** ondansetron (ZOFRAN) IV, polyethylene glycol  Micro Results Recent Results (from the past 240 hour(s))  Blood Culture (routine x 2)     Status: Abnormal   Collection Time: 10/01/16  9:10 AM  Result Value Ref Range Status   Specimen Description BLOOD RIGHT FOREARM  Final   Special Requests   Final    BOTTLES DRAWN AEROBIC AND ANAEROBIC  Blood Culture adequate volume   Culture  Setup Time   Final    GROWTH IN AEROBIC BOTTLE GRAM POSITIVE COCCI IN CLUSTERS Gram Stain Report Called to,Read Back By and Verified With: AMY HOBBS AT [id number] BY HFLYNT 10/02/16 CRITICAL RESULT CALLED TO, READ BACK BY AND VERIFIED WITH: M HAYES 10/03/16 @ 70 M VESTAL    Culture (A)  Final    STAPHYLOCOCCUS SPECIES (COAGULASE NEGATIVE) THE SIGNIFICANCE OF ISOLATING THIS ORGANISM FROM A SINGLE SET OF BLOOD CULTURES WHEN MULTIPLE SETS ARE DRAWN IS UNCERTAIN. PLEASE NOTIFY THE MICROBIOLOGY DEPARTMENT WITHIN ONE WEEK IF SPECIATION AND SENSITIVITIES ARE REQUIRED. Performed at [facility], [place] [address] [place], [place] [address]    Report Status 10/05/2016 FINAL  Final  Blood Culture ID Panel (Reflexed)  Status: Abnormal   Collection Time: 10/01/16  9:10 AM  Result Value Ref Range Status   Enterococcus species NOT DETECTED NOT DETECTED Final   Listeria monocytogenes NOT DETECTED NOT DETECTED Final   Staphylococcus species DETECTED (A) NOT DETECTED Final    Comment: Methicillin (oxacillin) susceptible coagulase negative staphylococcus. Possible blood culture contaminant (unless isolated from more than one blood culture draw or clinical case suggests pathogenicity). No antibiotic treatment is indicated for blood  culture contaminants. CRITICAL RESULT CALLED TO, READ BACK BY AND VERIFIED WITH: M HAYES 10/03/16 @ 74 M VESTAL    Staphylococcus aureus NOT DETECTED NOT DETECTED Final   Methicillin resistance NOT DETECTED NOT DETECTED Final   Streptococcus species NOT DETECTED NOT DETECTED Final   Streptococcus agalactiae NOT DETECTED NOT DETECTED Final   Streptococcus pneumoniae NOT DETECTED NOT DETECTED Final   Streptococcus pyogenes NOT DETECTED NOT DETECTED Final   Acinetobacter baumannii NOT DETECTED NOT DETECTED Final   Enterobacteriaceae species NOT DETECTED NOT DETECTED Final   Enterobacter cloacae complex NOT DETECTED NOT DETECTED Final    Escherichia coli NOT DETECTED NOT DETECTED Final   Klebsiella oxytoca NOT DETECTED NOT DETECTED Final   Klebsiella pneumoniae NOT DETECTED NOT DETECTED Final   Proteus species NOT DETECTED NOT DETECTED Final   Serratia marcescens NOT DETECTED NOT DETECTED Final   Haemophilus influenzae NOT DETECTED NOT DETECTED Final   Neisseria meningitidis NOT DETECTED NOT DETECTED Final   Pseudomonas aeruginosa NOT DETECTED NOT DETECTED Final   Candida albicans NOT DETECTED NOT DETECTED Final   Candida glabrata NOT DETECTED NOT DETECTED Final   Candida krusei NOT DETECTED NOT DETECTED Final   Candida parapsilosis NOT DETECTED NOT DETECTED Final   Candida tropicalis NOT DETECTED NOT DETECTED Final    Comment: Performed at [facility], [place]. [address] [place], [place] [address]  Urine culture     Status: Abnormal   Collection Time: 10/01/16  9:11 AM  Result Value Ref Range Status   Specimen Description URINE, CATHETERIZED  Final   Special Requests NONE  Final   Culture MULTIPLE SPECIES PRESENT, SUGGEST RECOLLECTION (A)  Final   Report Status 10/03/2016 FINAL  Final  Blood Culture (routine x 2)     Status: None   Collection Time: 10/01/16  9:15 AM  Result Value Ref Range Status   Specimen Description LEFT ANTECUBITAL  Final   Special Requests   Final    BOTTLES DRAWN AEROBIC AND ANAEROBIC Blood Culture adequate volume   Culture NO GROWTH 5 DAYS  Final   Report Status 10/06/2016 FINAL  Final  MRSA PCR Screening     Status: None   Collection Time: 10/01/16  8:26 PM  Result Value Ref Range Status   MRSA by PCR NEGATIVE NEGATIVE Final    Comment:        The GeneXpert MRSA Assay (FDA approved for NASAL specimens only), is one component of a comprehensive MRSA colonization surveillance program. It is not intended to diagnose MRSA infection nor to guide or monitor treatment for MRSA infections.     Radiology Reports Dg Chest 1 View  Result Date: 10/05/2016 CLINICAL DATA:   55 year old male with confusion, fever, difficulty swallowing. Chronic multiple sclerosis. EXAM: CHEST 1 VIEW COMPARISON:  10/02/2016 and earlier. FINDINGS: Supine AP view of the chest at 1414 hours. Similar low lung volumes. Increased platelike opacity in the left mid lung. Continued streaky opacity at the right lung base. Left lung base ventilation may have mildly improved. Stable cardiac  size and mediastinal contours. Visualized tracheal air column is within normal limits. No pneumothorax, pulmonary edema or pleural effusion. Negative visible bowel gas pattern. No acute osseous abnormality identified. IMPRESSION: 1. Continued low lung volumes. Linear increased opacity in the left mid lung appears typical for atelectasis. Continued patchy right lung base opacity which may also be atelectasis, but also consider aspiration in this clinical setting. 2. No pleural effusion identified. Electronically Signed   By: Genevie Ann M.D.   On: 10/05/2016 14:59   Ct Head Wo Contrast  Result Date: 10/05/2016 CLINICAL DATA:  56 year old male with confusion, fever, difficulty swallowing. Chronic multiple sclerosis. EXAM: CT HEAD WITHOUT CONTRAST TECHNIQUE: Contiguous axial images were obtained from the base of the skull through the vertex without intravenous contrast. COMPARISON:  Brain MRI 04/09/2012.  Head CT 04/08/2012. FINDINGS: Brain: Chronically age advanced cerebral volume loss with ex vacuo enlargement of the ventricles. Better demonstrated on the prior MRI, this primarily corresponds to cerebral white matter volume loss, with associated chronic white matter signal abnormality compatible with the given history of chronic demyelinating disease. No midline shift,mass effect, evidence of mass lesion, intracranial hemorrhage or evidence of cortically based acute infarction. Vascular: No suspicious intracranial vascular hyperdensity. Skull: Intact.  No acute osseous abnormality identified. Sinuses/Orbits: Mild sphenoid sinus  mucosal thickening is new since 2014. Other visible paranasal sinuses and mastoids are stable and well pneumatized. Other: Visualized orbits and scalp soft tissues are within normal limits. IMPRESSION: 1. Chronically advanced cerebral white matter volume loss in keeping with severe chronic multiple sclerosis as demonstrated by MRI in 2014. 2.  No acute intracranial abnormality. Electronically Signed   By: Genevie Ann M.D.   On: 10/05/2016 14:57   Dg Chest Port 1 View  Result Date: 10/02/2016 CLINICAL DATA:  Respiratory failure. EXAM: PORTABLE CHEST 1 VIEW COMPARISON:  10/01/2016 FINDINGS: Low lung volumes with vascular congestion, interstitial prominence and bibasilar atelectasis. Question mild interstitial edema. Heart is mildly enlarged, likely accentuated by the low volumes. IMPRESSION: Vascular congestion with interstitial prominence, question interstitial edema. Low volumes with bibasilar atelectasis. Electronically Signed   By: Rolm Baptise M.D.   On: 10/02/2016 09:53   Dg Chest Port 1 View  Result Date: 10/01/2016 CLINICAL DATA:  Altered mental status, history of Graves disease, shortness of breath EXAM: PORTABLE CHEST 1 VIEW COMPARISON:  04/08/2012 FINDINGS: Bilateral diffuse mild interstitial thickening. Linear band of airspace disease in the lingula likely reflecting atelectasis. No pleural effusion or pneumothorax. Stable cardiomediastinal silhouette. No acute osseous abnormality. IMPRESSION: 1. Bilateral diffuse mild interstitial thickening which may reflect mild interstitial edema versus infection. Electronically Signed   By: Kathreen Devoid   On: 10/01/2016 09:51    Time Spent in minutes  25   Louellen Molder M.D on 10/07/2016 at 5:03 PM  Between 7am to 7pm - Pager - [phone]  After 7pm go to [email or web address] - password [email or web address]  [facility] -  Office  [phone]

## 2016-10-08 DIAGNOSIS — F419 Anxiety disorder, unspecified: Secondary | ICD-10-CM | POA: Diagnosis not present

## 2016-10-08 DIAGNOSIS — G20 Parkinson's disease: Secondary | ICD-10-CM | POA: Diagnosis not present

## 2016-10-08 DIAGNOSIS — F321 Major depressive disorder, single episode, moderate: Secondary | ICD-10-CM | POA: Diagnosis not present

## 2016-10-08 DIAGNOSIS — R1312 Dysphagia, oropharyngeal phase: Secondary | ICD-10-CM | POA: Diagnosis not present

## 2016-10-08 DIAGNOSIS — E0500 Thyrotoxicosis with diffuse goiter without thyrotoxic crisis or storm: Secondary | ICD-10-CM | POA: Diagnosis not present

## 2016-10-08 DIAGNOSIS — F4323 Adjustment disorder with mixed anxiety and depressed mood: Secondary | ICD-10-CM | POA: Diagnosis not present

## 2016-10-08 DIAGNOSIS — K2970 Gastritis, unspecified, without bleeding: Secondary | ICD-10-CM | POA: Diagnosis not present

## 2016-10-08 DIAGNOSIS — R0989 Other specified symptoms and signs involving the circulatory and respiratory systems: Secondary | ICD-10-CM | POA: Diagnosis not present

## 2016-10-08 DIAGNOSIS — J9601 Acute respiratory failure with hypoxia: Secondary | ICD-10-CM | POA: Diagnosis not present

## 2016-10-08 DIAGNOSIS — J189 Pneumonia, unspecified organism: Secondary | ICD-10-CM | POA: Diagnosis not present

## 2016-10-08 DIAGNOSIS — R279 Unspecified lack of coordination: Secondary | ICD-10-CM | POA: Diagnosis not present

## 2016-10-08 DIAGNOSIS — A419 Sepsis, unspecified organism: Principal | ICD-10-CM

## 2016-10-08 DIAGNOSIS — G35 Multiple sclerosis: Secondary | ICD-10-CM

## 2016-10-08 DIAGNOSIS — I10 Essential (primary) hypertension: Secondary | ICD-10-CM | POA: Diagnosis not present

## 2016-10-08 DIAGNOSIS — R4182 Altered mental status, unspecified: Secondary | ICD-10-CM | POA: Diagnosis present

## 2016-10-08 DIAGNOSIS — K2980 Duodenitis without bleeding: Secondary | ICD-10-CM | POA: Diagnosis not present

## 2016-10-08 DIAGNOSIS — R1310 Dysphagia, unspecified: Secondary | ICD-10-CM | POA: Diagnosis not present

## 2016-10-08 DIAGNOSIS — R9389 Abnormal findings on diagnostic imaging of other specified body structures: Secondary | ICD-10-CM | POA: Diagnosis not present

## 2016-10-08 DIAGNOSIS — R509 Fever, unspecified: Secondary | ICD-10-CM | POA: Diagnosis not present

## 2016-10-08 DIAGNOSIS — R293 Abnormal posture: Secondary | ICD-10-CM | POA: Diagnosis not present

## 2016-10-08 DIAGNOSIS — F329 Major depressive disorder, single episode, unspecified: Secondary | ICD-10-CM | POA: Diagnosis not present

## 2016-10-08 DIAGNOSIS — F331 Major depressive disorder, recurrent, moderate: Secondary | ICD-10-CM | POA: Diagnosis not present

## 2016-10-08 DIAGNOSIS — J984 Other disorders of lung: Secondary | ICD-10-CM | POA: Diagnosis not present

## 2016-10-08 DIAGNOSIS — J90 Pleural effusion, not elsewhere classified: Secondary | ICD-10-CM | POA: Diagnosis not present

## 2016-10-08 DIAGNOSIS — Z7401 Bed confinement status: Secondary | ICD-10-CM | POA: Diagnosis not present

## 2016-10-08 DIAGNOSIS — K2990 Gastroduodenitis, unspecified, without bleeding: Secondary | ICD-10-CM | POA: Diagnosis not present

## 2016-10-08 DIAGNOSIS — G9349 Other encephalopathy: Secondary | ICD-10-CM | POA: Diagnosis not present

## 2016-10-08 DIAGNOSIS — L89610 Pressure ulcer of right heel, unstageable: Secondary | ICD-10-CM | POA: Diagnosis not present

## 2016-10-08 DIAGNOSIS — R261 Paralytic gait: Secondary | ICD-10-CM | POA: Diagnosis not present

## 2016-10-08 DIAGNOSIS — Z7689 Persons encountering health services in other specified circumstances: Secondary | ICD-10-CM | POA: Diagnosis not present

## 2016-10-08 DIAGNOSIS — R531 Weakness: Secondary | ICD-10-CM | POA: Diagnosis not present

## 2016-10-08 DIAGNOSIS — E039 Hypothyroidism, unspecified: Secondary | ICD-10-CM | POA: Diagnosis not present

## 2016-10-08 DIAGNOSIS — G250 Essential tremor: Secondary | ICD-10-CM | POA: Diagnosis not present

## 2016-10-08 DIAGNOSIS — Z79899 Other long term (current) drug therapy: Secondary | ICD-10-CM | POA: Diagnosis not present

## 2016-10-08 DIAGNOSIS — R402431 Glasgow coma scale score 3-8, in the field [EMT or ambulance]: Secondary | ICD-10-CM | POA: Diagnosis not present

## 2016-10-08 DIAGNOSIS — R061 Stridor: Secondary | ICD-10-CM | POA: Diagnosis not present

## 2016-10-08 DIAGNOSIS — L89153 Pressure ulcer of sacral region, stage 3: Secondary | ICD-10-CM | POA: Diagnosis not present

## 2016-10-08 DIAGNOSIS — J9811 Atelectasis: Secondary | ICD-10-CM | POA: Diagnosis not present

## 2016-10-08 DIAGNOSIS — E7800 Pure hypercholesterolemia, unspecified: Secondary | ICD-10-CM | POA: Diagnosis not present

## 2016-10-08 DIAGNOSIS — R634 Abnormal weight loss: Secondary | ICD-10-CM | POA: Diagnosis not present

## 2016-10-08 DIAGNOSIS — K2960 Other gastritis without bleeding: Secondary | ICD-10-CM | POA: Diagnosis not present

## 2016-10-08 DIAGNOSIS — Z931 Gastrostomy status: Secondary | ICD-10-CM | POA: Diagnosis not present

## 2016-10-08 DIAGNOSIS — G9341 Metabolic encephalopathy: Secondary | ICD-10-CM | POA: Diagnosis not present

## 2016-10-08 DIAGNOSIS — I700 Atherosclerosis of aorta: Secondary | ICD-10-CM | POA: Diagnosis not present

## 2016-10-08 DIAGNOSIS — F413 Other mixed anxiety disorders: Secondary | ICD-10-CM | POA: Diagnosis not present

## 2016-10-08 DIAGNOSIS — L98419 Non-pressure chronic ulcer of buttock with unspecified severity: Secondary | ICD-10-CM | POA: Diagnosis not present

## 2016-10-08 DIAGNOSIS — R52 Pain, unspecified: Secondary | ICD-10-CM | POA: Diagnosis not present

## 2016-10-08 DIAGNOSIS — F332 Major depressive disorder, recurrent severe without psychotic features: Secondary | ICD-10-CM | POA: Diagnosis not present

## 2016-10-08 DIAGNOSIS — L89311 Pressure ulcer of right buttock, stage 1: Secondary | ICD-10-CM | POA: Diagnosis not present

## 2016-10-08 DIAGNOSIS — F3340 Major depressive disorder, recurrent, in remission, unspecified: Secondary | ICD-10-CM | POA: Diagnosis not present

## 2016-10-08 DIAGNOSIS — E785 Hyperlipidemia, unspecified: Secondary | ICD-10-CM | POA: Diagnosis not present

## 2016-10-08 DIAGNOSIS — N200 Calculus of kidney: Secondary | ICD-10-CM | POA: Diagnosis not present

## 2016-10-08 DIAGNOSIS — R627 Adult failure to thrive: Secondary | ICD-10-CM | POA: Diagnosis not present

## 2016-10-08 DIAGNOSIS — M6281 Muscle weakness (generalized): Secondary | ICD-10-CM | POA: Diagnosis not present

## 2016-10-08 DIAGNOSIS — G252 Other specified forms of tremor: Secondary | ICD-10-CM | POA: Diagnosis not present

## 2016-10-08 DIAGNOSIS — G2111 Neuroleptic induced parkinsonism: Secondary | ICD-10-CM | POA: Diagnosis not present

## 2016-10-08 DIAGNOSIS — R609 Edema, unspecified: Secondary | ICD-10-CM | POA: Diagnosis not present

## 2016-10-08 DIAGNOSIS — R451 Restlessness and agitation: Secondary | ICD-10-CM | POA: Diagnosis not present

## 2016-10-08 DIAGNOSIS — F909 Attention-deficit hyperactivity disorder, unspecified type: Secondary | ICD-10-CM | POA: Diagnosis not present

## 2016-10-08 DIAGNOSIS — F411 Generalized anxiety disorder: Secondary | ICD-10-CM | POA: Diagnosis not present

## 2016-10-08 DIAGNOSIS — M436 Torticollis: Secondary | ICD-10-CM | POA: Diagnosis not present

## 2016-10-08 MED ORDER — HYDROCODONE-ACETAMINOPHEN 5-325 MG PO TABS: 1.0000 | ORAL_TABLET | Freq: Three times a day (TID) | ORAL | 0 refills | Status: DC | PRN

## 2016-10-08 MED ORDER — ALPRAZOLAM 0.25 MG PO TABS: 0.2500 mg | ORAL_TABLET | Freq: Every day | ORAL | 0 refills | Status: DC

## 2016-10-08 NOTE — Progress Notes (Signed)
  Speech Language Pathology Treatment: Dysphagia  Patient Details Name: Mark Kramer MRN: [id number] DOB: 1960/07/04 Today's Date: 10/08/2016 Time: 1310-1350 SLP Time Calculation (min) (ACUTE ONLY): 40 min  Assessment / Plan / Recommendation Clinical Impression  Pt seen at bedside for ongoing diagnostic dysphagia intervention following MBSS completed yesterday. Pt received in bed, lunch tray at bedside untouched. Pt asked if I would take him down to the [employer] and SLP re-oriented him to his hospitalization. Pt agreeable to try lunch meal. He required mod/max multimodality cues for safe and efficient intake due to oral holding of boluses. Meats had to be finely chopped and mixed with gravy and puree for ease of intake. Pt with occasional strong coughing episodes throughout the meal. SLP suggested that puree would be more efficient for Pt to consume right now, however Pt voiced that he did not want puree. Will downgrade back to D2/chopped and continue thin liquids with SLP f/u at [facility] upon discharge from acute setting. Pt in agreement with plan of care.    HPI HPI: [name] a 56 y.o.male with history of multiple sclerosis, wheelchair-bound who is a resident of a nursing center ([place]) and was sent to the ED this morning with fever of 102F, O2 sat drop to the 80s and being encephalopathic. Was not responding well and was lethargic. No recent illness, change in medications or fall documented. No history of nausea, vomiting, blurred vision, chest pain, shortness of breath, abdominal pain, diarrhea or dysuria.      SLP Plan  Continue with current plan of care       Recommendations  Diet recommendations: Dysphagia 2 (fine chop);Thin liquid Liquids provided via: Cup;Straw Medication Administration: Crushed with puree (crushed or whole in puree) Supervision: Staff to assist with self feeding;Full supervision/cueing for compensatory strategies Compensations: Slow rate;Small  sips/bites Postural Changes and/or Swallow Maneuvers: Seated upright 90 degrees                Oral Care Recommendations: Oral care QID;Staff/trained caregiver to provide oral care Follow up Recommendations: Skilled Nursing facility SLP Visit Diagnosis: Dysphagia, oropharyngeal phase (R13.12) Plan: Continue with current plan of care       Thank you,  Genene Churn, [place]                 [place] 10/08/2016, 2:20 PM

## 2016-10-08 NOTE — Care Management Important Message (Signed)
Important Message  Patient Details  Name: Mark Kramer MRN: [id number] Date of Birth: 1960-03-26   Medicare Important Message Given:  Yes    Sherald Barge, RN 10/08/2016, 2:11 PM

## 2016-10-08 NOTE — Clinical Social Work Note (Signed)
LCSW notified [name] at [facility] of patient's discharge. LCSW left a message for patient's mother advising of discharge. Transportation was arranged with [facility].      Mark Kramer, Clydene Pugh, LCSW

## 2016-10-08 NOTE — Discharge Summary (Signed)
Physician Discharge Summary  Mark Kramer PJK:[id number] DOB: 02/06/1960 DOA: 10/01/2016  PCP: Caprice Renshaw, MD  Admit date: 10/01/2016 Discharge date: 10/08/2016  Admitted From:SNF Disposition:   SNF  Recommendations for Outpatient Follow-up:  1. Follow up with PCP in 1-2 weeks 2. Please obtain BMP/CBC in one week    Discharge Condition: Stable CODE STATUS:  Dysphagia 3 with thin Diet recommendation: Heart Healthy    Brief/Interim Summary: 56 y.o.male with multiple sclerosis and wheelchair-bound who is a resident of a nursing center and was sent to the ED with reported confusion, fever of 102F and hypoxia with O2 sats dropping to the 80s. SNF nursing staff reported that he was not responding well and was lethargic. History was obtained by ED physician as the patient unable to provide any details. There were no reported recent illness, change in medications or falls documented. No history of nausea, vomiting, blurred vision, chest pain, shortness of breath, abdominal pain, diarrhea or dysuria. Patient was placed on 4 L O2 via nasal cannula and brought to the ED. In the ED patient was febrile with a temperature of 102.46F and tachypneic. Blood work showed WBC of 10.4, normal hemoglobin and platelets and chemistry. Lactic acid was normal. ABG showed pH of 7.43, PCO2 of 36 and PO2 of 68. His chest x-ray revealed bilateral diffuse interstitial thickening concerning for infection. Patient was given 3 L IV normal saline bolus per sepsis protocol. He was admitted for further evaluation and management  Discharge Diagnoses:   Sepsis due to pneumonia ([place]) Sepsis resolved. Discontinue antibiotics after 10/07/16 dose. (Complete 7 days). Flu PCR negative.  Active Problems: Acute metabolic encephalopathy Waxing and waning Episodes of altered mental status despite resolution of sepsis. Repeat head CT unchanged. EEG done and and results pending.  Mental status continues to improve and at  baseline at time of d/c. EEG--mild global slowing    Multiple sclerosis (HCC) On chronic fingolimod. Has moderate tremors of his hands bilaterally. Follow up with his neurologist as outpatient. -continue home dose mysoline     Decubitus ulcer of right buttock, stage 1  care per nursing.     Acute respiratory failure with hypoxia (HCC) Secondary to pneumonia. Resolved. -stable on RA at time of d/c  dysphagia Placed on dysphagia level III diet after swallow evaluation. however on 9/22 patient had difficulty taking his pills . Repeat SLP evaluation and MBS 10/07/16-->dysphagia 3 diet with thin liquids    Discharge Instructions  Discharge Instructions    Diet - low sodium heart healthy    Complete by:  As directed    Increase activity slowly    Complete by:  As directed      Allergies as of 10/08/2016   No Known Allergies     Medication List    TAKE these medications   acetaminophen 650 MG CR tablet Commonly known as:  TYLENOL Take 650 mg by mouth every 8 (eight) hours as needed for pain.   ALPRAZolam 0.25 MG tablet Commonly known as:  XANAX Take 1 tablet (0.25 mg total) by mouth daily.   amLODipine 10 MG tablet Commonly known as:  NORVASC Take 1 tablet (10 mg total) by mouth daily.   atorvastatin 80 MG tablet Commonly known as:  LIPITOR Take 80 mg by mouth daily.   calcium carbonate 500 MG chewable tablet Commonly known as:  TUMS - dosed in mg elemental calcium Chew 1 tablet by mouth 2 (two) times daily.   cholecalciferol 1000 units tablet Commonly known as:  VITAMIN D Take 1,000 Units by mouth daily.   DULoxetine 60 MG capsule Commonly known as:  CYMBALTA Take 60 mg by mouth daily.   furosemide 20 MG tablet Commonly known as:  LASIX Take 20 mg by mouth daily.   GILENYA 0.5 MG Caps Generic drug:  Fingolimod HCl Take 1 capsule by mouth daily. For Multiple Sclerosis   HYDROcodone-acetaminophen 5-325 MG tablet Commonly known as:   NORCO/VICODIN Take 1 tablet by mouth every 8 (eight) hours as needed for moderate pain.   ibuprofen 800 MG tablet Commonly known as:  ADVIL,MOTRIN Take 800 mg by mouth every 8 (eight) hours as needed for mild pain or moderate pain.   levothyroxine 112 MCG tablet Commonly known as:  SYNTHROID, LEVOTHROID Take 112 mcg by mouth daily.   multivitamin capsule Take 1 capsule by mouth daily.   nadolol 80 MG tablet Commonly known as:  CORGARD Take 80 mg by mouth 2 (two) times daily.   polyethylene glycol powder powder Commonly known as:  GLYCOLAX/MIRALAX   primidone 50 MG tablet Commonly known as:  MYSOLINE Take 50 mg by mouth 2 (two) times daily. 50mg  qam & 100mg  qhs   risperiDONE 3 MG tablet Commonly known as:  RISPERDAL Take 3 mg by mouth 2 (two) times daily. For depression            Discharge Care Instructions        Start     Ordered   10/08/16 0000  ALPRAZolam (XANAX) 0.25 MG tablet  Daily     10/08/16 1322   10/08/16 0000  HYDROcodone-acetaminophen (NORCO/VICODIN) 5-325 MG tablet  Every 8 hours PRN     10/08/16 1322   10/08/16 0000  Increase activity slowly     10/08/16 1322   10/08/16 0000  Diet - low sodium heart healthy     10/08/16 1322      No Known Allergies  Consultations:  none   Procedures/Studies: Dg Chest 1 View  Result Date: 10/05/2016 CLINICAL DATA:  56 year old male with confusion, fever, difficulty swallowing. Chronic multiple sclerosis. EXAM: CHEST 1 VIEW COMPARISON:  10/02/2016 and earlier. FINDINGS: Supine AP view of the chest at 1414 hours. Similar low lung volumes. Increased platelike opacity in the left mid lung. Continued streaky opacity at the right lung base. Left lung base ventilation may have mildly improved. Stable cardiac size and mediastinal contours. Visualized tracheal air column is within normal limits. No pneumothorax, pulmonary edema or pleural effusion. Negative visible bowel gas pattern. No acute osseous abnormality  identified. IMPRESSION: 1. Continued low lung volumes. Linear increased opacity in the left mid lung appears typical for atelectasis. Continued patchy right lung base opacity which may also be atelectasis, but also consider aspiration in this clinical setting. 2. No pleural effusion identified. Electronically Signed   By: Genevie Ann M.D.   On: 10/05/2016 14:59   Ct Head Wo Contrast  Result Date: 10/05/2016 CLINICAL DATA:  56 year old male with confusion, fever, difficulty swallowing. Chronic multiple sclerosis. EXAM: CT HEAD WITHOUT CONTRAST TECHNIQUE: Contiguous axial images were obtained from the base of the skull through the vertex without intravenous contrast. COMPARISON:  Brain MRI 04/09/2012.  Head CT 04/08/2012. FINDINGS: Brain: Chronically age advanced cerebral volume loss with ex vacuo enlargement of the ventricles. Better demonstrated on the prior MRI, this primarily corresponds to cerebral white matter volume loss, with associated chronic white matter signal abnormality compatible with the given history of chronic demyelinating disease. No midline shift,mass effect, evidence of mass lesion, intracranial  hemorrhage or evidence of cortically based acute infarction. Vascular: No suspicious intracranial vascular hyperdensity. Skull: Intact.  No acute osseous abnormality identified. Sinuses/Orbits: Mild sphenoid sinus mucosal thickening is new since 2014. Other visible paranasal sinuses and mastoids are stable and well pneumatized. Other: Visualized orbits and scalp soft tissues are within normal limits. IMPRESSION: 1. Chronically advanced cerebral white matter volume loss in keeping with severe chronic multiple sclerosis as demonstrated by MRI in 2014. 2.  No acute intracranial abnormality. Electronically Signed   By: Genevie Ann M.D.   On: 10/05/2016 14:57   Dg Chest Port 1 View  Result Date: 10/02/2016 CLINICAL DATA:  Respiratory failure. EXAM: PORTABLE CHEST 1 VIEW COMPARISON:  10/01/2016 FINDINGS: Low  lung volumes with vascular congestion, interstitial prominence and bibasilar atelectasis. Question mild interstitial edema. Heart is mildly enlarged, likely accentuated by the low volumes. IMPRESSION: Vascular congestion with interstitial prominence, question interstitial edema. Low volumes with bibasilar atelectasis. Electronically Signed   By: Rolm Baptise M.D.   On: 10/02/2016 09:53   Dg Chest Port 1 View  Result Date: 10/01/2016 CLINICAL DATA:  Altered mental status, history of Graves disease, shortness of breath EXAM: PORTABLE CHEST 1 VIEW COMPARISON:  04/08/2012 FINDINGS: Bilateral diffuse mild interstitial thickening. Linear band of airspace disease in the lingula likely reflecting atelectasis. No pleural effusion or pneumothorax. Stable cardiomediastinal silhouette. No acute osseous abnormality. IMPRESSION: 1. Bilateral diffuse mild interstitial thickening which may reflect mild interstitial edema versus infection. Electronically Signed   By: Kathreen Devoid   On: 10/01/2016 09:51   Dg Swallowing Func-speech Pathology  Result Date: 10/07/2016 Objective Swallowing Evaluation: Type of Study: MBS-Modified Barium Swallow Study Patient Details Name: Mark Kramer MRN: [id number] Date of Birth: 01/01/1961 Today's Date: 10/07/2016 Time: SLP Start Time (ACUTE ONLY): 1308-SLP Stop Time (ACUTE ONLY): 1352 SLP Time Calculation (min) (ACUTE ONLY): 44 min Past Medical History: Past Medical History: Diagnosis Date . Grave's disease  . High cholesterol  . Multiple sclerosis ([place])  Past Surgical History: Past Surgical History: Procedure Laterality Date . APPENDECTOMY   HPI: [name] a 56 y.o.male with history of multiple sclerosis, wheelchair-bound who is a resident of a nursing center ([place]) and was sent to the ED this morning with fever of 102F, O2 sat drop to the 80s and being encephalopathic. Was not responding well and was lethargic. No recent illness, change in medications or fall documented. No history  of nausea, vomiting, blurred vision, chest pain, shortness of breath, abdominal pain, diarrhea or dysuria. Subjective: "okay" Assessment / Plan / Recommendation CHL IP CLINICAL IMPRESSIONS 10/07/2016 Clinical Impression Pt assessed in the lateral position with barium tinged thin (tsp, cup, straw), puree, mech soft, regular textures, and barium tablet with thin liquids. Pt presents with moderate oral phase and mild pharyngeal phase characterized by weak lingual manipulation with lingual pumping and decreased rotary mastication resulting in delayed oral transit and also premature spillage with mixed consistencies. Pt had a single episode of deep penetration before the swallow when taking cup sips of thin liquid (this appeared to fall below the vocal folds and was expelled) and when attempting to swallow the barium tablet with thin liquids (premature spillage and deep penetration of thins while pill still in oral cavity). Of note, Pt with seemingly prominent posterior pharyngeal wall and epiglottic deflection was often slow to return once deflected. Epiglottis often remained deflected during sequential sips. Pt demonstrated improved oral control with straw sip presentations. He required verbal cues to move bolus with tongue and masticate solids during  oral phase with solids. Pt also assessed with head forward (ie. Not supported by pillow behind head) which caused tremors. Recommend D3/mech soft and thin liquids via straw sips and po meds whole in puree or crushed as able (Pt will have more difficulty with mixed consistencies). Recommend f/u SLP services for diet tolerance at SNF. SLP Visit Diagnosis Dysphagia, oropharyngeal phase (R13.12) Attention and concentration deficit following -- Frontal lobe and executive function deficit following -- Impact on safety and function Mild aspiration risk;Moderate aspiration risk   CHL IP TREATMENT RECOMMENDATION 10/07/2016 Treatment Recommendations Therapy as outlined in treatment  plan below   Prognosis 10/07/2016 Prognosis for Safe Diet Advancement Good Barriers to Reach Goals (No Data) Barriers/Prognosis Comment -- CHL IP DIET RECOMMENDATION 10/07/2016 SLP Diet Recommendations Dysphagia 3 (Mech soft) solids;Thin liquid Liquid Administration via -- Medication Administration Whole meds with puree Compensations Slow rate;Small sips/bites Postural Changes --   CHL IP OTHER RECOMMENDATIONS 10/07/2016 Recommended Consults -- Oral Care Recommendations Oral care before and after PO;Staff/trained caregiver to provide oral care Other Recommendations Clarify dietary restrictions   CHL IP FOLLOW UP RECOMMENDATIONS 10/07/2016 Follow up Recommendations Skilled Nursing facility   [facility] IP FREQUENCY AND DURATION 10/07/2016 Speech Therapy Frequency (ACUTE ONLY) min 2x/week Treatment Duration 1 week      CHL IP ORAL PHASE 10/07/2016 Oral Phase Impaired Oral - Pudding Teaspoon -- Oral - Pudding Cup -- Oral - Honey Teaspoon -- Oral - Honey Cup -- Oral - Nectar Teaspoon -- Oral - Nectar Cup -- Oral - Nectar Straw -- Oral - Thin Teaspoon WFL Oral - Thin Cup WFL Oral - Thin Straw WFL Oral - Puree Lingual pumping;Reduced posterior propulsion;Holding of bolus;Piecemeal swallowing Oral - Mech Soft Impaired mastication;Weak lingual manipulation;Reduced posterior propulsion;Piecemeal swallowing;Lingual/palatal residue;Decreased bolus cohesion Oral - Regular Impaired mastication;Weak lingual manipulation;Reduced posterior propulsion;Piecemeal swallowing;Lingual/palatal residue;Decreased bolus cohesion Oral - Multi-Consistency -- Oral - Pill Reduced posterior propulsion;Piecemeal swallowing;Lingual/palatal residue;Delayed oral transit;Premature spillage Oral Phase - Comment --  CHL IP PHARYNGEAL PHASE 10/07/2016 Pharyngeal Phase Impaired Pharyngeal- Pudding Teaspoon -- Pharyngeal -- Pharyngeal- Pudding Cup -- Pharyngeal -- Pharyngeal- Honey Teaspoon -- Pharyngeal -- Pharyngeal- Honey Cup -- Pharyngeal -- Pharyngeal- Nectar  Teaspoon -- Pharyngeal -- Pharyngeal- Nectar Cup -- Pharyngeal -- Pharyngeal- Nectar Straw -- Pharyngeal -- Pharyngeal- Thin Teaspoon Delayed swallow initiation-vallecula Pharyngeal -- Pharyngeal- Thin Cup Delayed swallow initiation-vallecula;Penetration/Aspiration before swallow Pharyngeal Material enters airway, passes BELOW cords then ejected out Pharyngeal- Thin Straw Delayed swallow initiation-vallecula Pharyngeal -- Pharyngeal- Puree Delayed swallow initiation-vallecula;Pharyngeal residue - pyriform Pharyngeal -- Pharyngeal- Mechanical Soft Delayed swallow initiation-vallecula Pharyngeal -- Pharyngeal- Regular Delayed swallow initiation-vallecula Pharyngeal -- Pharyngeal- Multi-consistency -- Pharyngeal -- Pharyngeal- Pill Penetration/Aspiration before swallow Pharyngeal Material enters airway, passes BELOW cords then ejected out Pharyngeal Comment --  CHL IP CERVICAL ESOPHAGEAL PHASE 10/07/2016 Cervical Esophageal Phase WFL Pudding Teaspoon -- Pudding Cup -- Honey Teaspoon -- Honey Cup -- Nectar Teaspoon -- Nectar Cup -- Nectar Straw -- Thin Teaspoon -- Thin Cup -- Thin Straw -- Puree -- Mechanical Soft -- Regular -- Multi-consistency -- Pill -- Cervical Esophageal Comment -- No flowsheet data found. Thank you, Genene Churn, Owen Jenner 10/07/2016, 9:07 PM                   Discharge Exam: Vitals:   10/08/16 0235 10/08/16 0601  BP: (!) 124/59 (!) 142/74  Pulse: 70 67  Resp:  20  Temp:  98.8 F (37.1 C)  SpO2:  93%   Vitals:   10/07/16 2018 10/07/16  2236 10/08/16 0235 10/08/16 0601  BP: (!) 115/54  (!) 124/59 (!) 142/74  Pulse: 66 72 70 67  Resp: 20   20  Temp: 99.3 F (37.4 C)   98.8 F (37.1 C)  TempSrc: Oral   Oral  SpO2: 93%   93%  Weight:      Height:        General: Pt is alert, awake, not in acute distress Cardiovascular: RRR, S1/S2 +, no rubs, no gallops Respiratory: bibasilar crackles, no wheeze Abdominal: Soft, NT, ND, bowel sounds  + Extremities: trace LE edema, no cyanosis   The results of significant diagnostics from this hospitalization (including imaging, microbiology, ancillary and laboratory) are listed below for reference.    Significant Diagnostic Studies: Dg Chest 1 View  Result Date: 10/05/2016 CLINICAL DATA:  56 year old male with confusion, fever, difficulty swallowing. Chronic multiple sclerosis. EXAM: CHEST 1 VIEW COMPARISON:  10/02/2016 and earlier. FINDINGS: Supine AP view of the chest at 1414 hours. Similar low lung volumes. Increased platelike opacity in the left mid lung. Continued streaky opacity at the right lung base. Left lung base ventilation may have mildly improved. Stable cardiac size and mediastinal contours. Visualized tracheal air column is within normal limits. No pneumothorax, pulmonary edema or pleural effusion. Negative visible bowel gas pattern. No acute osseous abnormality identified. IMPRESSION: 1. Continued low lung volumes. Linear increased opacity in the left mid lung appears typical for atelectasis. Continued patchy right lung base opacity which may also be atelectasis, but also consider aspiration in this clinical setting. 2. No pleural effusion identified. Electronically Signed   By: Genevie Ann M.D.   On: 10/05/2016 14:59   Ct Head Wo Contrast  Result Date: 10/05/2016 CLINICAL DATA:  56 year old male with confusion, fever, difficulty swallowing. Chronic multiple sclerosis. EXAM: CT HEAD WITHOUT CONTRAST TECHNIQUE: Contiguous axial images were obtained from the base of the skull through the vertex without intravenous contrast. COMPARISON:  Brain MRI 04/09/2012.  Head CT 04/08/2012. FINDINGS: Brain: Chronically age advanced cerebral volume loss with ex vacuo enlargement of the ventricles. Better demonstrated on the prior MRI, this primarily corresponds to cerebral white matter volume loss, with associated chronic white matter signal abnormality compatible with the given history of chronic  demyelinating disease. No midline shift,mass effect, evidence of mass lesion, intracranial hemorrhage or evidence of cortically based acute infarction. Vascular: No suspicious intracranial vascular hyperdensity. Skull: Intact.  No acute osseous abnormality identified. Sinuses/Orbits: Mild sphenoid sinus mucosal thickening is new since 2014. Other visible paranasal sinuses and mastoids are stable and well pneumatized. Other: Visualized orbits and scalp soft tissues are within normal limits. IMPRESSION: 1. Chronically advanced cerebral white matter volume loss in keeping with severe chronic multiple sclerosis as demonstrated by MRI in 2014. 2.  No acute intracranial abnormality. Electronically Signed   By: Genevie Ann M.D.   On: 10/05/2016 14:57   Dg Chest Port 1 View  Result Date: 10/02/2016 CLINICAL DATA:  Respiratory failure. EXAM: PORTABLE CHEST 1 VIEW COMPARISON:  10/01/2016 FINDINGS: Low lung volumes with vascular congestion, interstitial prominence and bibasilar atelectasis. Question mild interstitial edema. Heart is mildly enlarged, likely accentuated by the low volumes. IMPRESSION: Vascular congestion with interstitial prominence, question interstitial edema. Low volumes with bibasilar atelectasis. Electronically Signed   By: Rolm Baptise M.D.   On: 10/02/2016 09:53   Dg Chest Port 1 View  Result Date: 10/01/2016 CLINICAL DATA:  Altered mental status, history of Graves disease, shortness of breath EXAM: PORTABLE CHEST 1 VIEW COMPARISON:  04/08/2012  FINDINGS: Bilateral diffuse mild interstitial thickening. Linear band of airspace disease in the lingula likely reflecting atelectasis. No pleural effusion or pneumothorax. Stable cardiomediastinal silhouette. No acute osseous abnormality. IMPRESSION: 1. Bilateral diffuse mild interstitial thickening which may reflect mild interstitial edema versus infection. Electronically Signed   By: Kathreen Devoid   On: 10/01/2016 09:51   Dg Swallowing Func-speech  Pathology  Result Date: 10/07/2016 Objective Swallowing Evaluation: Type of Study: MBS-Modified Barium Swallow Study Patient Details Name: Mark Kramer MRN: [id number] Date of Birth: 1960-05-28 Today's Date: 10/07/2016 Time: SLP Start Time (ACUTE ONLY): 1308-SLP Stop Time (ACUTE ONLY): 1352 SLP Time Calculation (min) (ACUTE ONLY): 44 min Past Medical History: Past Medical History: Diagnosis Date . Grave's disease  . High cholesterol  . Multiple sclerosis ([place])  Past Surgical History: Past Surgical History: Procedure Laterality Date . APPENDECTOMY   HPI: [name] a 56 y.o.male with history of multiple sclerosis, wheelchair-bound who is a resident of a nursing center ([place]) and was sent to the ED this morning with fever of 102F, O2 sat drop to the 80s and being encephalopathic. Was not responding well and was lethargic. No recent illness, change in medications or fall documented. No history of nausea, vomiting, blurred vision, chest pain, shortness of breath, abdominal pain, diarrhea or dysuria. Subjective: "okay" Assessment / Plan / Recommendation CHL IP CLINICAL IMPRESSIONS 10/07/2016 Clinical Impression Pt assessed in the lateral position with barium tinged thin (tsp, cup, straw), puree, mech soft, regular textures, and barium tablet with thin liquids. Pt presents with moderate oral phase and mild pharyngeal phase characterized by weak lingual manipulation with lingual pumping and decreased rotary mastication resulting in delayed oral transit and also premature spillage with mixed consistencies. Pt had a single episode of deep penetration before the swallow when taking cup sips of thin liquid (this appeared to fall below the vocal folds and was expelled) and when attempting to swallow the barium tablet with thin liquids (premature spillage and deep penetration of thins while pill still in oral cavity). Of note, Pt with seemingly prominent posterior pharyngeal wall and epiglottic deflection was often slow  to return once deflected. Epiglottis often remained deflected during sequential sips. Pt demonstrated improved oral control with straw sip presentations. He required verbal cues to move bolus with tongue and masticate solids during oral phase with solids. Pt also assessed with head forward (ie. Not supported by pillow behind head) which caused tremors. Recommend D3/mech soft and thin liquids via straw sips and po meds whole in puree or crushed as able (Pt will have more difficulty with mixed consistencies). Recommend f/u SLP services for diet tolerance at SNF. SLP Visit Diagnosis Dysphagia, oropharyngeal phase (R13.12) Attention and concentration deficit following -- Frontal lobe and executive function deficit following -- Impact on safety and function Mild aspiration risk;Moderate aspiration risk   CHL IP TREATMENT RECOMMENDATION 10/07/2016 Treatment Recommendations Therapy as outlined in treatment plan below   Prognosis 10/07/2016 Prognosis for Safe Diet Advancement Good Barriers to Reach Goals (No Data) Barriers/Prognosis Comment -- CHL IP DIET RECOMMENDATION 10/07/2016 SLP Diet Recommendations Dysphagia 3 (Mech soft) solids;Thin liquid Liquid Administration via -- Medication Administration Whole meds with puree Compensations Slow rate;Small sips/bites Postural Changes --   CHL IP OTHER RECOMMENDATIONS 10/07/2016 Recommended Consults -- Oral Care Recommendations Oral care before and after PO;Staff/trained caregiver to provide oral care Other Recommendations Clarify dietary restrictions   CHL IP FOLLOW UP RECOMMENDATIONS 10/07/2016 Follow up Recommendations Skilled Nursing facility   [facility] IP FREQUENCY AND DURATION 10/07/2016 Speech Therapy  Frequency (ACUTE ONLY) min 2x/week Treatment Duration 1 week      CHL IP ORAL PHASE 10/07/2016 Oral Phase Impaired Oral - Pudding Teaspoon -- Oral - Pudding Cup -- Oral - Honey Teaspoon -- Oral - Honey Cup -- Oral - Nectar Teaspoon -- Oral - Nectar Cup -- Oral - Nectar Straw -- Oral -  Thin Teaspoon WFL Oral - Thin Cup WFL Oral - Thin Straw WFL Oral - Puree Lingual pumping;Reduced posterior propulsion;Holding of bolus;Piecemeal swallowing Oral - Mech Soft Impaired mastication;Weak lingual manipulation;Reduced posterior propulsion;Piecemeal swallowing;Lingual/palatal residue;Decreased bolus cohesion Oral - Regular Impaired mastication;Weak lingual manipulation;Reduced posterior propulsion;Piecemeal swallowing;Lingual/palatal residue;Decreased bolus cohesion Oral - Multi-Consistency -- Oral - Pill Reduced posterior propulsion;Piecemeal swallowing;Lingual/palatal residue;Delayed oral transit;Premature spillage Oral Phase - Comment --  CHL IP PHARYNGEAL PHASE 10/07/2016 Pharyngeal Phase Impaired Pharyngeal- Pudding Teaspoon -- Pharyngeal -- Pharyngeal- Pudding Cup -- Pharyngeal -- Pharyngeal- Honey Teaspoon -- Pharyngeal -- Pharyngeal- Honey Cup -- Pharyngeal -- Pharyngeal- Nectar Teaspoon -- Pharyngeal -- Pharyngeal- Nectar Cup -- Pharyngeal -- Pharyngeal- Nectar Straw -- Pharyngeal -- Pharyngeal- Thin Teaspoon Delayed swallow initiation-vallecula Pharyngeal -- Pharyngeal- Thin Cup Delayed swallow initiation-vallecula;Penetration/Aspiration before swallow Pharyngeal Material enters airway, passes BELOW cords then ejected out Pharyngeal- Thin Straw Delayed swallow initiation-vallecula Pharyngeal -- Pharyngeal- Puree Delayed swallow initiation-vallecula;Pharyngeal residue - pyriform Pharyngeal -- Pharyngeal- Mechanical Soft Delayed swallow initiation-vallecula Pharyngeal -- Pharyngeal- Regular Delayed swallow initiation-vallecula Pharyngeal -- Pharyngeal- Multi-consistency -- Pharyngeal -- Pharyngeal- Pill Penetration/Aspiration before swallow Pharyngeal Material enters airway, passes BELOW cords then ejected out Pharyngeal Comment --  CHL IP CERVICAL ESOPHAGEAL PHASE 10/07/2016 Cervical Esophageal Phase WFL Pudding Teaspoon -- Pudding Cup -- Honey Teaspoon -- Honey Cup -- Nectar Teaspoon -- Nectar Cup  -- Nectar Straw -- Thin Teaspoon -- Thin Cup -- Thin Straw -- Puree -- Mechanical Soft -- Regular -- Multi-consistency -- Pill -- Cervical Esophageal Comment -- No flowsheet data found. Thank you, Genene Churn, La Dolores Ericson 10/07/2016, 9:07 PM                Microbiology: Recent Results (from the past 240 hour(s))  Blood Culture (routine x 2)     Status: Abnormal   Collection Time: 10/01/16  9:10 AM  Result Value Ref Range Status   Specimen Description BLOOD RIGHT FOREARM  Final   Special Requests   Final    BOTTLES DRAWN AEROBIC AND ANAEROBIC Blood Culture adequate volume   Culture  Setup Time   Final    GROWTH IN AEROBIC BOTTLE GRAM POSITIVE COCCI IN CLUSTERS Gram Stain Report Called to,Read Back By and Verified With: AMY HOBBS AT 0350 BY HFLYNT 10/02/16 CRITICAL RESULT CALLED TO, READ BACK BY AND VERIFIED WITH: M HAYES 10/03/16 @ 83 M VESTAL    Culture (A)  Final    STAPHYLOCOCCUS SPECIES (COAGULASE NEGATIVE) THE SIGNIFICANCE OF ISOLATING THIS ORGANISM FROM A SINGLE SET OF BLOOD CULTURES WHEN MULTIPLE SETS ARE DRAWN IS UNCERTAIN. PLEASE NOTIFY THE MICROBIOLOGY DEPARTMENT WITHIN ONE WEEK IF SPECIATION AND SENSITIVITIES ARE REQUIRED. Performed at [facility], [place] [address] [place], [place] [address]    Report Status 10/05/2016 FINAL  Final  Blood Culture ID Panel (Reflexed)     Status: Abnormal   Collection Time: 10/01/16  9:10 AM  Result Value Ref Range Status   Enterococcus species NOT DETECTED NOT DETECTED Final   Listeria monocytogenes NOT DETECTED NOT DETECTED Final   Staphylococcus species DETECTED (A) NOT DETECTED Final    Comment: Methicillin (oxacillin) susceptible coagulase negative staphylococcus. Possible  blood culture contaminant (unless isolated from more than one blood culture draw or clinical case suggests pathogenicity). No antibiotic treatment is indicated for blood  culture contaminants. CRITICAL RESULT CALLED TO, READ BACK BY AND  VERIFIED WITH: M HAYES 10/03/16 @ 90 M VESTAL    Staphylococcus aureus NOT DETECTED NOT DETECTED Final   Methicillin resistance NOT DETECTED NOT DETECTED Final   Streptococcus species NOT DETECTED NOT DETECTED Final   Streptococcus agalactiae NOT DETECTED NOT DETECTED Final   Streptococcus pneumoniae NOT DETECTED NOT DETECTED Final   Streptococcus pyogenes NOT DETECTED NOT DETECTED Final   Acinetobacter baumannii NOT DETECTED NOT DETECTED Final   Enterobacteriaceae species NOT DETECTED NOT DETECTED Final   Enterobacter cloacae complex NOT DETECTED NOT DETECTED Final   Escherichia coli NOT DETECTED NOT DETECTED Final   Klebsiella oxytoca NOT DETECTED NOT DETECTED Final   Klebsiella pneumoniae NOT DETECTED NOT DETECTED Final   Proteus species NOT DETECTED NOT DETECTED Final   Serratia marcescens NOT DETECTED NOT DETECTED Final   Haemophilus influenzae NOT DETECTED NOT DETECTED Final   Neisseria meningitidis NOT DETECTED NOT DETECTED Final   Pseudomonas aeruginosa NOT DETECTED NOT DETECTED Final   Candida albicans NOT DETECTED NOT DETECTED Final   Candida glabrata NOT DETECTED NOT DETECTED Final   Candida krusei NOT DETECTED NOT DETECTED Final   Candida parapsilosis NOT DETECTED NOT DETECTED Final   Candida tropicalis NOT DETECTED NOT DETECTED Final    Comment: Performed at [facility], [place], [place], [place] [address]  Urine culture     Status: Abnormal   Collection Time: 10/01/16  9:11 AM  Result Value Ref Range Status   Specimen Description URINE, CATHETERIZED  Final   Special Requests NONE  Final   Culture MULTIPLE SPECIES PRESENT, SUGGEST RECOLLECTION (A)  Final   Report Status 10/03/2016 FINAL  Final  Blood Culture (routine x 2)     Status: None   Collection Time: 10/01/16  9:15 AM  Result Value Ref Range Status   Specimen Description LEFT ANTECUBITAL  Final   Special Requests   Final    BOTTLES DRAWN AEROBIC AND ANAEROBIC Blood Culture adequate volume    Culture NO GROWTH 5 DAYS  Final   Report Status 10/06/2016 FINAL  Final  MRSA PCR Screening     Status: None   Collection Time: 10/01/16  8:26 PM  Result Value Ref Range Status   MRSA by PCR NEGATIVE NEGATIVE Final    Comment:        The GeneXpert MRSA Assay (FDA approved for NASAL specimens only), is one component of a comprehensive MRSA colonization surveillance program. It is not intended to diagnose MRSA infection nor to guide or monitor treatment for MRSA infections.      Labs: Basic Metabolic Panel:  Recent Labs Lab 10/02/16 0631 10/03/16 1540 10/05/16 0709  NA 138 136 139  K 4.0 3.8 4.0  CL 103 99* 103  CO2 24 27 28   GLUCOSE 92 97 106*  BUN 16 11 7   CREATININE 0.79 0.77 0.69  CALCIUM 8.9 9.2 9.3   Liver Function Tests: No results for input(s): AST, ALT, ALKPHOS, BILITOT, PROT, ALBUMIN in the last 168 hours. No results for input(s): LIPASE, AMYLASE in the last 168 hours.  Recent Labs Lab 10/03/16 1540  AMMONIA 33   CBC:  Recent Labs Lab 10/02/16 0631 10/03/16 1540  WBC 7.8 6.0  HGB 15.0 16.1  HCT 45.9 48.1  MCV 86.6 86.7  PLT 223 257  Cardiac Enzymes: No results for input(s): CKTOTAL, CKMB, CKMBINDEX, TROPONINI in the last 168 hours. BNP: Invalid input(s): POCBNP CBG:  Recent Labs Lab 10/03/16 0309 10/03/16 0338  GLUCAP 69 84    Time coordinating discharge:  Greater than 30 minutes  Signed:  Elisabeth Strom, DO [facility] Pager: [phone] 10/08/2016, 1:33 PM

## 2016-10-13 DIAGNOSIS — G35 Multiple sclerosis: Secondary | ICD-10-CM | POA: Diagnosis not present

## 2016-10-13 DIAGNOSIS — J189 Pneumonia, unspecified organism: Secondary | ICD-10-CM | POA: Diagnosis not present

## 2016-10-13 DIAGNOSIS — I10 Essential (primary) hypertension: Secondary | ICD-10-CM | POA: Diagnosis not present

## 2016-10-13 DIAGNOSIS — F419 Anxiety disorder, unspecified: Secondary | ICD-10-CM | POA: Diagnosis not present

## 2016-10-17 DIAGNOSIS — L89153 Pressure ulcer of sacral region, stage 3: Secondary | ICD-10-CM | POA: Diagnosis not present

## 2016-10-21 DIAGNOSIS — G35 Multiple sclerosis: Secondary | ICD-10-CM | POA: Diagnosis not present

## 2016-10-21 DIAGNOSIS — J189 Pneumonia, unspecified organism: Secondary | ICD-10-CM | POA: Diagnosis not present

## 2016-10-21 DIAGNOSIS — A419 Sepsis, unspecified organism: Secondary | ICD-10-CM | POA: Diagnosis not present

## 2016-10-24 DIAGNOSIS — L89153 Pressure ulcer of sacral region, stage 3: Secondary | ICD-10-CM | POA: Diagnosis not present

## 2016-10-27 DIAGNOSIS — R509 Fever, unspecified: Secondary | ICD-10-CM | POA: Diagnosis not present

## 2016-10-27 DIAGNOSIS — R1310 Dysphagia, unspecified: Secondary | ICD-10-CM | POA: Diagnosis not present

## 2016-10-27 DIAGNOSIS — J189 Pneumonia, unspecified organism: Secondary | ICD-10-CM | POA: Diagnosis not present

## 2016-10-28 DIAGNOSIS — R9389 Abnormal findings on diagnostic imaging of other specified body structures: Secondary | ICD-10-CM | POA: Diagnosis not present

## 2016-10-31 ENCOUNTER — Other Ambulatory Visit: Payer: Self-pay

## 2016-10-31 ENCOUNTER — Encounter (HOSPITAL_COMMUNITY): Payer: Self-pay | Admitting: *Deleted

## 2016-10-31 ENCOUNTER — Ambulatory Visit (INDEPENDENT_AMBULATORY_CARE_PROVIDER_SITE_OTHER): Payer: Medicare Other | Admitting: Nurse Practitioner

## 2016-10-31 ENCOUNTER — Encounter: Payer: Self-pay | Admitting: Nurse Practitioner

## 2016-10-31 ENCOUNTER — Encounter (HOSPITAL_COMMUNITY)
Admission: RE | Disposition: A | Discharge status: Home / Self Care | Payer: Self-pay | Source: Ambulatory Visit | Attending: Gastroenterology

## 2016-10-31 ENCOUNTER — Ambulatory Visit (HOSPITAL_COMMUNITY)
Admission: RE | Admit: 2016-10-31 | Discharge: 2016-10-31 | Disposition: A | Discharge status: Home / Self Care | Payer: Medicare Other | Source: Ambulatory Visit | Attending: Gastroenterology | Admitting: Gastroenterology

## 2016-10-31 VITALS — BP 145/81 | HR 53

## 2016-10-31 DIAGNOSIS — K2990 Gastroduodenitis, unspecified, without bleeding: Secondary | ICD-10-CM | POA: Diagnosis not present

## 2016-10-31 DIAGNOSIS — R1312 Dysphagia, oropharyngeal phase: Secondary | ICD-10-CM

## 2016-10-31 DIAGNOSIS — R627 Adult failure to thrive: Secondary | ICD-10-CM

## 2016-10-31 DIAGNOSIS — E0500 Thyrotoxicosis with diffuse goiter without thyrotoxic crisis or storm: Secondary | ICD-10-CM | POA: Diagnosis not present

## 2016-10-31 DIAGNOSIS — E7800 Pure hypercholesterolemia, unspecified: Secondary | ICD-10-CM | POA: Diagnosis not present

## 2016-10-31 DIAGNOSIS — G35 Multiple sclerosis: Secondary | ICD-10-CM | POA: Insufficient documentation

## 2016-10-31 DIAGNOSIS — R1310 Dysphagia, unspecified: Secondary | ICD-10-CM | POA: Diagnosis not present

## 2016-10-31 DIAGNOSIS — K2970 Gastritis, unspecified, without bleeding: Secondary | ICD-10-CM | POA: Diagnosis not present

## 2016-10-31 DIAGNOSIS — K2980 Duodenitis without bleeding: Secondary | ICD-10-CM | POA: Diagnosis not present

## 2016-10-31 DIAGNOSIS — R638 Other symptoms and signs concerning food and fluid intake: Secondary | ICD-10-CM

## 2016-10-31 DIAGNOSIS — J189 Pneumonia, unspecified organism: Secondary | ICD-10-CM | POA: Insufficient documentation

## 2016-10-31 DIAGNOSIS — R6251 Failure to thrive (child): Secondary | ICD-10-CM | POA: Insufficient documentation

## 2016-10-31 DIAGNOSIS — Z79899 Other long term (current) drug therapy: Secondary | ICD-10-CM | POA: Insufficient documentation

## 2016-10-31 DIAGNOSIS — K2960 Other gastritis without bleeding: Secondary | ICD-10-CM | POA: Diagnosis not present

## 2016-10-31 HISTORY — DX: Hypothyroidism, unspecified: E03.9

## 2016-10-31 HISTORY — PX: BIOPSY: SHX5522

## 2016-10-31 HISTORY — PX: ESOPHAGOGASTRODUODENOSCOPY: SHX5428

## 2016-10-31 SURGERY — EGD (ESOPHAGOGASTRODUODENOSCOPY)
Anesthesia: Moderate Sedation

## 2016-10-31 MED ORDER — LIDOCAINE VISCOUS 2 % MT SOLN
OROMUCOSAL | Status: AC
  Filled 2016-10-31: qty 15

## 2016-10-31 MED ORDER — MEPERIDINE HCL 100 MG/ML IJ SOLN
INTRAMUSCULAR | Status: AC
  Filled 2016-10-31: qty 2

## 2016-10-31 MED ORDER — PROMETHAZINE HCL 25 MG/ML IJ SOLN: 12.5000 mg | Freq: Once | INTRAMUSCULAR | Status: DC

## 2016-10-31 MED ORDER — ATROPINE SULFATE 1 MG/ML IJ SOLN
INTRAMUSCULAR | Status: AC
  Filled 2016-10-31: qty 1

## 2016-10-31 MED ORDER — ATROPINE SULFATE 1 MG/ML IJ SOLN
INTRAMUSCULAR | Status: DC | PRN
  Administered 2016-10-31: .5 mg via INTRAVENOUS

## 2016-10-31 MED ORDER — MIDAZOLAM HCL 5 MG/5ML IJ SOLN
INTRAMUSCULAR | Status: AC
  Filled 2016-10-31: qty 10

## 2016-10-31 MED ORDER — CEFAZOLIN SODIUM-DEXTROSE 2-4 GM/100ML-% IV SOLN
INTRAVENOUS | Status: AC
  Filled 2016-10-31: qty 100

## 2016-10-31 MED ORDER — SODIUM CHLORIDE 0.9 % IV SOLN
INTRAVENOUS | Status: DC
  Administered 2016-10-31: 1000 mL via INTRAVENOUS

## 2016-10-31 MED ORDER — LIDOCAINE 1 % OPTIME INJ - NO CHARGE
INTRAMUSCULAR | Status: DC | PRN
  Administered 2016-10-31: 5 mL via INTRADERMAL

## 2016-10-31 MED ORDER — MIDAZOLAM HCL 5 MG/5ML IJ SOLN
INTRAMUSCULAR | Status: DC | PRN
  Administered 2016-10-31 (×2): 1 mg via INTRAVENOUS

## 2016-10-31 MED ORDER — CEFAZOLIN SODIUM-DEXTROSE 2-4 GM/100ML-% IV SOLN: 2.0000 g | Freq: Once | INTRAVENOUS | Status: DC

## 2016-10-31 MED ORDER — BUTAMBEN-TETRACAINE-BENZOCAINE 2-2-14 % EX AERO
INHALATION_SPRAY | CUTANEOUS | Status: DC | PRN
  Administered 2016-10-31: 2 via TOPICAL

## 2016-10-31 MED ORDER — MEPERIDINE HCL 100 MG/ML IJ SOLN
INTRAMUSCULAR | Status: DC | PRN
  Administered 2016-10-31: 25 mg via INTRAVENOUS

## 2016-10-31 MED ORDER — STERILE WATER FOR IRRIGATION IR SOLN
Status: DC | PRN
  Administered 2016-10-31: 15:00:00

## 2016-10-31 MED ORDER — BUTAMBEN-TETRACAINE-BENZOCAINE 2-2-14 % EX AERO
INHALATION_SPRAY | CUTANEOUS | Status: AC
  Filled 2016-10-31: qty 5

## 2016-10-31 NOTE — Patient Instructions (Addendum)
1. Proceed to [facility] to have your PEG tube (feeding tube) procedure. 2. Further instructions related to how to care for the tube will be given to you after your procedure. 3. Return for follow-up in 6 weeks. 4. Call if you have any worsening symptoms, questions, or concerns.      PEG Tube Home Guide A percutaneous endoscopic gastrostomy (PEG) tube is used to deliver food and fluids directly into the stomach. The tube has a clamp, a cap, and two anchors (bolsters). One bolster keeps the tube from coming out of the stomach. The other bolster holds the tube against the abdomen. You will be taught how to use and adjust your PEG tube before you leave the hospital. You will also be taught how to care for the opening (stoma) in your abdomen. Make sure that you understand: How to care for your PEG tube. How to care for your stoma. How to give yourself feedings and medicines. When to call your health care provider for help.  How do I care for my peg tube? Check your PEG tube every day. Make sure: It is not too tight. The bolster should rest gently over the stoma. It is in the right position. There is a mark on the tube that shows when it is in the right position. Adjust the tube if you need to.  How do I care for my stoma? Clean your stoma every day. Follow these steps: Wash your hands with soap and water. Check your stoma for redness, leaking, or skin irritation. Wash the stoma gently with warm, soapy water. Rinse the stoma with warm water. Pat the stoma area dry. You may place a gauze pad over the opening between the outer bolster and your stoma.  How do I give myself nutritional formula? Your health care provider will give you instructions about: How much nutrition and fluid you will need for each feeding. How often to have a feeding. Whether to take medicine in the tube by itself or with a feeding.   To give yourself a feeding, follow these steps: Hoyle Barr out all of the  equipment that you will need. Make sure that the nutritional formula is at room temperature. Wash your hands with soap and water. Position yourself so that you are upright. You will need to stay upright throughout the feeding and for at least 30 minutes after the feeding. Make sure the syringe plunger is pushed in. Place the tip of the syringe in clear water, and slowly pull the plunger to bring (draw up) the water into the syringe. Remove the clamp and the cap from the PEG tube. Push the water out of the syringe to clean (flush) the tube. If the tube is clear, draw up the formula into the syringe. Make sure to use the right amount for each feeding and add water if necessary. Slowly push the formula from the syringe through the tube. After the feeding, flush the tube with water. Put the clamp and the cap on the tube.   How do I give myself medicine? To give yourself medicine, follow these steps: Hoyle Barr out all of the equipment that you will need. If your medicine is in tablet form, crush the tablet and dissolve it in water. Wash your hands with soap and water. Position yourself so that you are upright. You will need to stay upright while you give yourself medicine and for at least 30 minutes afterward. Make sure the syringe plunger is pushed in. Place the tip  of the syringe in clear water, and slowly pull the plunger to bring (draw up) the water into the syringe. Remove the clamp and the cap from the PEG tube. Push the water out of the syringe to clean (flush) the tube. If the tube is clear, draw up the medicine into the syringe. Slowly push the medicine from the syringe through the tube.  Flush the tube with water. Put the clamp and the cap on the tube.  You should not take sustained release (SR) medicines through your tube. If you are unsure if your medicine is a SR medicine, ask your health care provider or pharmacist. Contact a health care provider if: You have soreness, redness, or  irritation around your stoma. You have abdominal pain or bloating during or after your feedings. You have nausea, constipation, or diarrhea that will not go away. You have a fever. You have problems with your PEG tube. Get help right away if: Your tube is blocked. Your tube falls out. You have pain around your tube. You are bleeding from your tube. Your tube is leaking. You choke or you have trouble breathing during or after a feeding. This information is not intended to replace advice given to you by your health care provider. Make sure you discuss any questions you have with your health care provider. Document Released: 05/16/2014 Document Revised: 06/04/2015 Document Reviewed: 01/04/2014 Elsevier Interactive Patient Education  [employer]. 5.

## 2016-10-31 NOTE — Assessment & Plan Note (Signed)
Failure to thrive likely due to significantly decreased oral intake including minimal fluids and at most 25% of meals. This is complicating his already complicated medical history and appears to be decompensated and deconditioned at this time. This is based off observations of a staff person who is familiar with the patient. He would likely benefit from PEG placement for adequate hydration, nutrition, and medications. Prior to his decline with pneumonia, his parents state he was very participated in nursing home activities including bingo. They stated he was lively and, although he has limitations related to MS, he was living aa full a life as possible. They feel this is very much not the case at this time.

## 2016-10-31 NOTE — Op Note (Addendum)
[facility] Patient Name: Mark Kramer Procedure Date: 10/31/2016 2:30 PM MRN: [id number] Date of Birth: [birth year] Attending MD: Barney Drain MD, MD CSN: [id number] Age: 56 Admit Type: Outpatient Procedure:                Upper GI endoscopy WITH COLD FORCEPS BIOPSY Indications:              Dysphagia Providers:                Barney Drain MD, MD, Janeece Riggers, RN, Randa Spike, Technician Referring MD:             Caprice Renshaw, MD Medicines:                Meperidine 25 mg IV, Midazolam 2 mg IV, ATROPINE                            0.5 MG IV Complications:            No immediate complications. Estimated Blood Loss:     Estimated blood loss was minimal. Procedure:                Pre-Anesthesia Assessment:                           - Prior to the procedure, a History and Physical                            was performed, and patient medications and                            allergies were reviewed. The patient's tolerance of                            previous anesthesia was also reviewed. The risks                            and benefits of the procedure and the sedation                            options and risks were discussed with the patient.                            All questions were answered, and informed consent                            was obtained. Prior Anticoagulants: The patient has                            taken no previous anticoagulant or antiplatelet                            agents. ASA Grade Assessment: II - A patient with  mild systemic disease. After reviewing the risks                            and benefits, the patient was deemed in                            satisfactory condition to undergo the procedure.                            After obtaining informed consent, the endoscope was                            passed under direct vision. Throughout the                            procedure,  the patient's blood pressure, pulse, and                            oxygen saturations were monitored continuously. The                            [id number]-[id number] ([id number]) scope was introduced through the                            mouth, and advanced to the second part of duodenum.                            The upper GI endoscopy was technically difficult                            and complex due to the patient's oxygen                            desaturation AND HR 48-50. ATROPINE IV GIVEN.                            Successful completion of the procedure was aided by                            performing chin lift and administering oxygen. The                            patient tolerated the procedure fairly well.                            ABDOMEN TRANSILLUMINATED BUT UNABLE TO ACCESS LUMEN                            WITH NEEDLE x3. UNABLE TO PLACE PEG. Scope In: 2:42:00 PM Scope Out: 2:57:14 PM Total Procedure Duration: 0 hours 15 minutes 14 seconds  Findings:      The examined esophagus was normal.      Diffuse moderate inflammation characterized by congestion (edema) and       erythema  was found in the entire examined stomach. Biopsies were taken       with a cold forceps for Helicobacter pylori testing.      Patchy mild inflammation characterized by congestion (edema) and       erythema was found in the duodenal bulb and in the second portion of the       duodenum. Impression:               - UNABLE TO PLACE PEG. UNABLE TO ACCESS LUMEN OF                            STOMACH                           - MODERATE Gastritis AND MILD Duodenitis. Moderate Sedation:      Moderate (conscious) sedation was administered by the endoscopy nurse       and supervised by the endoscopist. The following parameters were       monitored: oxygen saturation, heart rate, blood pressure, and response       to care. Total physician intraservice time was 20 minutes. Recommendation:           - Await  pathology results.                           - Use Prevacid (lansoprazole) 30 mg PO daily.                           - Continue present medications.                           - Resume previous diet.                           - Refer to an interventional radiologist in 1 week.                           - Patient has a contact number available for                            emergencies. The signs and symptoms of potential                            delayed complications were discussed with the                            patient. Return to normal activities tomorrow.                            Written discharge instructions were provided to the                            patient. Procedure Code(s):        --- Professional ---                           [phone], Esophagogastroduodenoscopy, flexible,  transoral; with biopsy, single or multiple                           99152, Moderate sedation services provided by the                            same physician or other qualified health care                            professional performing the diagnostic or                            therapeutic service that the sedation supports,                            requiring the presence of an independent trained                            observer to assist in the monitoring of the                            patient's level of consciousness and physiological                            status; initial 15 minutes of intraservice time,                            patient age 46 years or older Diagnosis Code(s):        --- Professional ---                           K29.70, Gastritis, unspecified, without bleeding                           K29.80, Duodenitis without bleeding                           R13.10, Dysphagia, unspecified CPT copyright 2016 [employer]. All rights reserved. The codes documented in this report are preliminary and upon coder review may  be  revised to meet current compliance requirements. Barney Drain, MD Barney Drain MD, MD 10/31/2016 3:08:55 PM This report has been signed electronically. Number of Addenda: 0

## 2016-10-31 NOTE — Assessment & Plan Note (Signed)
The patient has recently been diagnosed with pneumonia and admitted. He remains on antibiotics at this time. His lungs do have some rhonchi, but no severe adventitious sounds. Per nursing home notes his room air saturations are between 96 and 98. He does not appear to be in respiratory distress. Some increased risk for PEG placement with recent pneumonia but overall does not appear to be a significant increased risk.

## 2016-10-31 NOTE — Progress Notes (Signed)
Primary Care Physician:  Caprice Renshaw, MD Primary Gastroenterologist:  Dr. Oneida Alar  Chief Complaint  Patient presents with  . Dysphagia    ASAP feeding tube/failure to thrive    HPI:   Mark Kramer is a 56 y.o. male who presents For urgent evaluation to consider placement of a PEG tube due to failure to thrive and severe dysphagia. The patient was recently admitted for pneumonia. Posthospitalization note reviewed dated 10/28/2016 for abnormal chest x-ray with possible pneumonia. Further records reviewed indicating dysphagia on modified barium swallow seen by speech therapy. CBC and differential completed 10/22/2016 without significant findings. He does have a history of multiple sclerosis and until his admission for pneumonia he was clinically stable. Past medical history includes MS, hypertension, ADHD, depression, hyperlipidemia, hypothyroidism, anxiety.   Recent admission notes reviewed for admission on 10/01/2016 through 10/08/2016 for sepsis and community-acquired pneumonia. At that time noted dysphagia and was placed on dysphagia 3 diet after swallowing evaluation but noted difficulties with pills. He has not been previously seen by [place]. Modified barium swallow study report reviewed.  Today he is accompanied by his mother and father Jeannett Senior and Claiborne Billings) who assist with the visit today. He has had worsening since his admission for pneumonia. He is not eating much food, when he does he has persistent coughing/choking.l no regurgitation. However, because of difficulties he is not eating much, Only able to tolerate small sips of water. Unsure about the amount of medication he is able to take. They are unsure of how his pneumonia is doing, but they note continued congestion. They deny symptoms of abdominal pain, hematochezia, melena, other abnormalities with his stools. Deny fevers since discharge. Deny any other GI symptoms.  Review of records indicate afebril (97-98) and good sats (96-98 on room  air). Staff spoke with unit managed, last in take 8:30 am minimal breakfast. Has been averaging at most 25% meal intake; minimal fluids. PICC placed yesterday.  Past Medical History:  Diagnosis Date  . Grave's disease   . High cholesterol   . Multiple sclerosis ([place])     Past Surgical History:  Procedure Laterality Date  . APPENDECTOMY      Current Outpatient Prescriptions  Medication Sig Dispense Refill  . acetaminophen (TYLENOL) 650 MG CR tablet Take 650 mg by mouth every 8 (eight) hours as needed for pain.    [name] ALPRAZolam (XANAX) 0.25 MG tablet Take 1 tablet (0.25 mg total) by mouth daily. 2 tablet 0  . amLODipine (NORVASC) 10 MG tablet Take 1 tablet (10 mg total) by mouth daily. 30 tablet 2  . atorvastatin (LIPITOR) 80 MG tablet Take 80 mg by mouth daily.    . busPIRone (BUSPAR) 7.5 MG tablet Take 7.5 mg by mouth 2 (two) times daily.    . calcium carbonate (TUMS - DOSED IN MG ELEMENTAL CALCIUM) 500 MG chewable tablet Chew 1 tablet by mouth 2 (two) times daily.    . cholecalciferol (VITAMIN D) 1000 UNITS tablet Take 1,000 Units by mouth daily.     . DULoxetine (CYMBALTA) 60 MG capsule Take 60 mg by mouth daily.    . Fingolimod HCl (GILENYA) 0.5 MG CAPS Take 1 capsule by mouth daily. For Multiple Sclerosis    . furosemide (LASIX) 20 MG tablet Take 20 mg by mouth daily.    [name] HYDROcodone-acetaminophen (NORCO/VICODIN) 5-325 MG tablet Take 1 tablet by mouth every 8 (eight) hours as needed for moderate pain. 4 tablet 0  . ibuprofen (ADVIL,MOTRIN) 800 MG tablet Take 800  mg by mouth every 8 (eight) hours as needed for mild pain or moderate pain.     [name] levofloxacin (LEVAQUIN) 750 MG tablet Take 750 mg by mouth daily. For pneumonia    . Multiple Vitamin (MULTIVITAMIN) capsule Take 1 capsule by mouth daily.    . nadolol (CORGARD) 80 MG tablet Take 80 mg by mouth 2 (two) times daily.     . polyethylene glycol powder (GLYCOLAX/MIRALAX) powder Take by mouth daily.     . primidone (MYSOLINE) 50  MG tablet Take 50 mg by mouth 2 (two) times daily. 50mg  qam & 100mg  qhs    . risperiDONE (RISPERDAL) 3 MG tablet Take 3 mg by mouth 2 (two) times daily. For depression     No current facility-administered medications for this visit.     Allergies as of 10/31/2016  . (No Known Allergies)    No family history on file.  Social History   Social History  . Marital status: Divorced    Spouse name: N/A  . Number of children: N/A  . Years of education: N/A   Occupational History  . Not on file.   Social History Main Topics  . Smoking status: Never Smoker  . Smokeless tobacco: Never Used  . Alcohol use No  . Drug use: No  . Sexual activity: No   Other Topics Concern  . Not on file   Social History Narrative  . No narrative on file    Review of Systems: Limited due to limited communication from [place]; assisted by parents CV: Negative for chest pain, angina, palpitations.  Respiratory: Negative for dyspnea at rest. Admits cough, sputum.  GI: See history of present illness. Neuro: Global weakness, unable to ambulate.  Heme: Negative for bruising or bleeding.    Physical Exam: BP (!) 145/81   Pulse (!) 53  General:   Alert and oriented. Pleasant and cooperative. Well-nourished and well-developed. In a reclined wheelchair with padding. Eyes:  Without icterus, sclera clear and conjunctiva pink.  Ears:  Normal auditory acuity. Cardiovascular:  S1, S2 present without murmurs appreciated. Extremities without clubbing or edema. Respiratory:  Clear to auscultation bilaterally. No wheezes, rales, or rhonchi. No distress.  Gastrointestinal:  +BS, soft, non-tender and non-distended. No HSM noted. No guarding or rebound. No masses appreciated.  Rectal:  Deferred  Musculoskalatal:  Symmetrical without gross deformities. Neurologic:  Alert and oriented x4;  Global weakness, severely limited communication ability. Psych:  Alert and cooperative. Heme/Lymph/Immune: No excessive bruising  noted.    10/31/2016 11:04 AM   Disclaimer: This note was dictated with voice recognition software. Similar sounding words can inadvertently be transcribed and may not be corrected upon review.

## 2016-10-31 NOTE — Assessment & Plan Note (Signed)
The patient with severe dysphagia which seems to be worsening. Per his parents he has choking with almost every meal. Because of this he has not eating as much. He is only taking and at most 25% of each meal per nursing unit manager. He is also only taking sips of liquids. His dysphagia is resulting in increased risk of deconditioning and dehydration. We will plan for PEG placement for nutrition, hydration, medications. The case was discussed with Dr. Oneida Alar who is in agreement.  Plan for PEG placement today. Return for follow-up in 6 weeks. Further instructions per endoscopist after procedure.

## 2016-10-31 NOTE — Discharge Instructions (Signed)
HE HAS gastritis & DUODENITIS. I biopsied HIS stomach.   RESUME PREVIOUS DIET.  THE FEEDING TUBE COULD NOT BE PLACED DUE TO MY BEING UNABLE TO INSERT GUIDEWIRE INTO THE STOMACH LUMEN. HE WILL NEED FEEDING TUBE PLACED BY INTERVENTIONAL RADIOLOGY IN [place].  HIS BIOPSY RESULTS WILL BE AVAILABLE IN MY CHART AFTER OCT 22 AND MY OFFICE WILL CONTACT YOU IN 10-14 DAYS WITH HIS RESULTS.   PLEASE CALL WITH QUESTIONS OR CONCERNS.   UPPER ENDOSCOPY AFTER CARE Read the instructions outlined below and refer to this sheet in the next week. These discharge instructions provide you with general information on caring for yourself after you leave the hospital. While your treatment has been planned according to the most current medical practices available, unavoidable complications occasionally occur. If you have any problems or questions after discharge, call DR. Warda Mcqueary, [phone].  ACTIVITY  You may resume your regular activity, but move at a slower pace for the next 24 hours.   Take frequent rest periods for the next 24 hours.   Walking will help get rid of the air and reduce the bloated feeling in your belly (abdomen).   No driving for 24 hours (because of the medicine (anesthesia) used during the test).   You may shower.   Do not sign any important legal documents or operate any machinery for 24 hours (because of the anesthesia used during the test).    NUTRITION  Drink plenty of fluids.   You may resume your normal diet as instructed by your doctor.   Begin with a light meal and progress to your normal diet. Heavy or fried foods are harder to digest and may make you feel sick to your stomach (nauseated).   Avoid alcoholic beverages for 24 hours or as instructed.    MEDICATIONS  You may resume your normal medications.   WHAT YOU CAN EXPECT TODAY  Some feelings of bloating in the abdomen.   Passage of more gas than usual.    IF YOU HAD A BIOPSY TAKEN DURING THE UPPER  ENDOSCOPY:  Eat a soft diet IF YOU HAVE NAUSEA, BLOATING, ABDOMINAL PAIN, OR VOMITING.    FINDING OUT THE RESULTS OF YOUR TEST Not all test results are available during your visit. DR. Oneida Alar WILL CALL YOU WITHIN 14 DAYS OF YOUR PROCEDUE WITH YOUR RESULTS. Do not assume everything is normal if you have not heard from DR. Aliceson Dolbow, CALL HER OFFICE AT [phone].  SEEK IMMEDIATE MEDICAL ATTENTION AND CALL THE OFFICE: [phone] IF:  You have more than a spotting of blood in your stool.   Your belly is swollen (abdominal distention).   You are nauseated or vomiting.   You have a temperature over 101F.   You have abdominal pain or discomfort that is severe or gets worse throughout the day.   Gastritis/DUODENITIS  Gastritis is an inflammation (the body's way of reacting to injury and/or infection) of the stomach. DUODENITIS is an inflammation (the body's way of reacting to injury and/or infection) of the FIRST PART OF THE SMALL INTESTINES. It is often caused by bacterial (germ) infections. It can also be caused BY ASPIRIN, BC/GOODY POWDER'S, (IBUPROFEN) MOTRIN, OR ALEVE (NAPROXEN), chemicals (including alcohol), SPICY FOODS, and medications. This illness may be associated with generalized malaise (feeling tired, not well), UPPER ABDOMINAL STOMACH cramps, and fever. One common bacterial cause of gastritis is an organism known as H. Pylori. This can be treated with antibiotics.

## 2016-10-31 NOTE — H&P (Signed)
Primary Care Physician:  Caprice Renshaw, MD Primary Gastroenterologist:  Dr. Oneida Alar  Pre-Procedure History & Physical: HPI:  Mark Kramer is a 56 y.o. male here for [facility].  Past Medical History:  Diagnosis Date  . Grave's disease   . High cholesterol   . Hypothyroidism   . Multiple sclerosis ([place])     Past Surgical History:  Procedure Laterality Date  . APPENDECTOMY      Prior to Admission medications   Medication Sig Start Date End Date Taking? Authorizing Provider  ALPRAZolam (XANAX) 0.25 MG tablet Take 1 tablet (0.25 mg total) by mouth daily. 10/08/16  Yes Tat, Shanon Brow, MD  amLODipine (NORVASC) 10 MG tablet Take 1 tablet (10 mg total) by mouth daily. 04/13/12  Yes Oswald Hillock, MD  atorvastatin (LIPITOR) 80 MG tablet Take 80 mg by mouth daily.   Yes [provider]  busPIRone (BUSPAR) 7.5 MG tablet Take 7.5 mg by mouth 2 (two) times daily.   Yes [provider]  calcium carbonate (TUMS - DOSED IN MG ELEMENTAL CALCIUM) 500 MG chewable tablet Chew 1 tablet by mouth 2 (two) times daily.   Yes [provider]  cholecalciferol (VITAMIN D) 1000 UNITS tablet Take 1,000 Units by mouth daily.    Yes [provider]  DULoxetine (CYMBALTA) 60 MG capsule Take 60 mg by mouth daily.   Yes [provider]  Fingolimod HCl (GILENYA) 0.5 MG CAPS Take 1 capsule by mouth daily. For Multiple Sclerosis   Yes [provider]  furosemide (LASIX) 20 MG tablet Take 20 mg by mouth daily.   Yes [provider]  levofloxacin (LEVAQUIN) 750 MG tablet Take 750 mg by mouth daily. For pneumonia   Yes [provider]  levothyroxine (SYNTHROID, LEVOTHROID) 112 MCG tablet Take 112 mcg by mouth daily before breakfast.   Yes [provider]  Multiple Vitamin (MULTIVITAMIN) capsule Take 1 capsule by mouth daily.   Yes [provider]  nadolol (CORGARD) 80 MG tablet Take 80 mg by mouth 2 (two) times daily.    Yes [provider]  polyethylene glycol powder (GLYCOLAX/MIRALAX) powder Take by mouth daily.  01/18/15  Yes [provider]  primidone (MYSOLINE) 50 MG tablet Take 50 mg by mouth 2 (two) times daily. 50mg  qam & 100mg  qhs   Yes [provider]  risperiDONE (RISPERDAL) 3 MG tablet Take 3 mg by mouth 2 (two) times daily. For depression   Yes [provider]  acetaminophen (TYLENOL) 650 MG CR tablet Take 650 mg by mouth every 8 (eight) hours as needed for pain.    [provider]  HYDROcodone-acetaminophen (NORCO/VICODIN) 5-325 MG tablet Take 1 tablet by mouth every 8 (eight) hours as needed for moderate pain. 10/08/16   Orson Eva, MD  ibuprofen (ADVIL,MOTRIN) 800 MG tablet Take 800 mg by mouth every 8 (eight) hours as needed for mild pain or moderate pain.  06/18/15   [provider]    Allergies as of 10/31/2016  . (No Known Allergies)    History reviewed. No pertinent family history.  Social History   Social History  . Marital status: Divorced    Spouse name: N/A  . Number of children: N/A  . Years of education: N/A   Occupational History  . Not on file.   Social History Main Topics  . Smoking status: Never Smoker  . Smokeless tobacco: Never Used  . Alcohol use No  . Drug use: No  . Sexual activity: No  Other Topics Concern  . Not on file   Social History Narrative  . No narrative on file    Review of Systems: See HPI, otherwise negative ROS   Physical Exam: BP 114/74   Pulse (!) 51   Temp (!) 97.4 F (36.3 C) (Axillary)   Resp 14   SpO2 94%  General:   Alert,  pleasant and cooperative in NAD Head:  Normocephalic and atraumatic. Neck:  Supple; Lungs:  Clear throughout to auscultation.    Heart:  Regular rate and rhythm. Abdomen:  Soft, nontender and nondistended. Normal bowel sounds, without guarding, and without rebound.   Neurologic:  Alert ,AWAKE;  grossly neurologically IMPAIRED.  Impression/Plan:      DYSPHAGIA  PLAN:  EGD with PEG TODAY. LEVAQUIN IN PAST 24 HRS. DISCUSSED PROCEDURE, BENEFITS, & RISKS: < 1% chance of medication reaction, PERFORATION, ASPIRATION, BURIED BUMPER SYNDROME, OR bleeding.

## 2016-11-03 ENCOUNTER — Telehealth: Payer: Self-pay | Admitting: Gastroenterology

## 2016-11-03 DIAGNOSIS — K2990 Gastroduodenitis, unspecified, without bleeding: Secondary | ICD-10-CM | POA: Diagnosis not present

## 2016-11-03 DIAGNOSIS — R1310 Dysphagia, unspecified: Secondary | ICD-10-CM

## 2016-11-03 DIAGNOSIS — G35 Multiple sclerosis: Secondary | ICD-10-CM | POA: Diagnosis not present

## 2016-11-03 DIAGNOSIS — R634 Abnormal weight loss: Secondary | ICD-10-CM

## 2016-11-03 DIAGNOSIS — L89153 Pressure ulcer of sacral region, stage 3: Secondary | ICD-10-CM | POA: Diagnosis not present

## 2016-11-03 DIAGNOSIS — L89610 Pressure ulcer of right heel, unstageable: Secondary | ICD-10-CM | POA: Diagnosis not present

## 2016-11-03 NOTE — Telephone Encounter (Signed)
PT FAILED ATTEMPT AT PEG TUBE VIA ENDOSCOPY. REFER TO IR FOR PEG TUBE PLACEMENT THIS WEEK, Dx: DYSPHAGIA, MULTIPLE SCLEROSIS.

## 2016-11-03 NOTE — Addendum Note (Signed)
Addended by: Inge Rise on: 11/03/2016 11:21 AM   Modules accepted: Orders

## 2016-11-03 NOTE — Progress Notes (Signed)
CC'ED TO PCP 

## 2016-11-03 NOTE — Telephone Encounter (Addendum)
Called IR and was advised order for this is IR Gastrostomy tube mode sed. Order has been placed. Called IR and was advised once order is reviewed they will call us to schedule this

## 2016-11-04 ENCOUNTER — Telehealth (HOSPITAL_COMMUNITY): Payer: Self-pay

## 2016-11-04 NOTE — Telephone Encounter (Signed)
Caryl Pina called from IR. Dr. Annamaria Boots looked in chart and states he needs CT abdomen without contrast before peg tube can be placed. Per Caryl Pina this is typically required and it has been a while since the patient last had one. Please advise Dr. Oneida Alar thanks

## 2016-11-04 NOTE — Telephone Encounter (Signed)
Called IR and LMOVM to schedule this for patient

## 2016-11-04 NOTE — Telephone Encounter (Signed)
Called Mindy from [facility] to inform her that the pt will need a CT abdomen w/o before we can place g-tube. Pt hasn't had any recent imaging. Mindy agreed to send the doctor a message about ordering. AW

## 2016-11-05 ENCOUNTER — Encounter (HOSPITAL_COMMUNITY): Payer: Self-pay | Admitting: Gastroenterology

## 2016-11-05 NOTE — Addendum Note (Signed)
Addended by: Danie Binder on: 11/05/2016 10:24 AM   Modules accepted: Orders

## 2016-11-05 NOTE — Telephone Encounter (Signed)
Megan at [facility] called. Informed of CT appt and instructions.

## 2016-11-05 NOTE — Telephone Encounter (Addendum)
SPOKE WITH DR. Reesa Chew. RECOMMEND CT ABD W/O IV CONTRAST THIS WEEK. PT ONLY NEEDS ONE BOTTLE OF ORAL CONTRAST IF HE CAN SWALLOW IT DUE TO RISK OF ASPIRATION.

## 2016-11-05 NOTE — Telephone Encounter (Signed)
CT appt scheduled for 11/07/16 at 7pm, arrival time at 6:45pm. Contrast needs to be picked up at [facility]. NPO 4 hrs prior to test  Called [facility] and had to LMOVM to advise of CT appt.

## 2016-11-07 ENCOUNTER — Ambulatory Visit (HOSPITAL_COMMUNITY)
Admission: RE | Admit: 2016-11-07 | Discharge: 2016-11-07 | Disposition: A | Discharge status: Home / Self Care | Payer: Medicare Other | Source: Ambulatory Visit | Attending: Gastroenterology | Admitting: Gastroenterology

## 2016-11-07 DIAGNOSIS — J90 Pleural effusion, not elsewhere classified: Secondary | ICD-10-CM | POA: Diagnosis not present

## 2016-11-07 DIAGNOSIS — N200 Calculus of kidney: Secondary | ICD-10-CM | POA: Diagnosis not present

## 2016-11-07 DIAGNOSIS — R634 Abnormal weight loss: Secondary | ICD-10-CM

## 2016-11-07 DIAGNOSIS — I700 Atherosclerosis of aorta: Secondary | ICD-10-CM | POA: Diagnosis not present

## 2016-11-10 DIAGNOSIS — R261 Paralytic gait: Secondary | ICD-10-CM | POA: Diagnosis not present

## 2016-11-10 DIAGNOSIS — L89610 Pressure ulcer of right heel, unstageable: Secondary | ICD-10-CM | POA: Diagnosis not present

## 2016-11-10 DIAGNOSIS — M436 Torticollis: Secondary | ICD-10-CM | POA: Diagnosis not present

## 2016-11-10 DIAGNOSIS — G2111 Neuroleptic induced parkinsonism: Secondary | ICD-10-CM | POA: Diagnosis not present

## 2016-11-10 DIAGNOSIS — G35 Multiple sclerosis: Secondary | ICD-10-CM | POA: Diagnosis not present

## 2016-11-11 DIAGNOSIS — F329 Major depressive disorder, single episode, unspecified: Secondary | ICD-10-CM | POA: Diagnosis not present

## 2016-11-11 DIAGNOSIS — I10 Essential (primary) hypertension: Secondary | ICD-10-CM | POA: Diagnosis not present

## 2016-11-11 DIAGNOSIS — G35 Multiple sclerosis: Secondary | ICD-10-CM | POA: Diagnosis not present

## 2016-11-11 NOTE — Progress Notes (Signed)
Pt's mom, Elijan Googe, is aware of results and also the nurse at [facility]. Faxing the note to [place] at [facility]. Manuela Schwartz, please send to Dr. Reesa Chew.

## 2016-11-11 NOTE — Progress Notes (Signed)
To Susan.

## 2016-11-13 ENCOUNTER — Other Ambulatory Visit: Payer: Self-pay | Admitting: Student

## 2016-11-13 ENCOUNTER — Other Ambulatory Visit: Payer: Self-pay | Admitting: Radiology

## 2016-11-13 NOTE — Telephone Encounter (Signed)
Looks like IR has called [facility] and informed of appt with them 11/14/16 at 1:00pm.

## 2016-11-14 ENCOUNTER — Encounter (HOSPITAL_COMMUNITY): Payer: Self-pay

## 2016-11-14 ENCOUNTER — Ambulatory Visit (HOSPITAL_COMMUNITY)
Admission: RE | Admit: 2016-11-14 | Discharge: 2016-11-14 | Disposition: A | Discharge status: Home / Self Care | Payer: Medicare Other | Source: Ambulatory Visit | Attending: Gastroenterology | Admitting: Gastroenterology

## 2016-11-14 DIAGNOSIS — E039 Hypothyroidism, unspecified: Secondary | ICD-10-CM | POA: Diagnosis not present

## 2016-11-14 DIAGNOSIS — E7800 Pure hypercholesterolemia, unspecified: Secondary | ICD-10-CM | POA: Insufficient documentation

## 2016-11-14 DIAGNOSIS — R1310 Dysphagia, unspecified: Secondary | ICD-10-CM | POA: Insufficient documentation

## 2016-11-14 DIAGNOSIS — G35 Multiple sclerosis: Secondary | ICD-10-CM | POA: Insufficient documentation

## 2016-11-14 HISTORY — PX: IR GASTROSTOMY TUBE MOD SED: IMG625

## 2016-11-14 LAB — CBC
HCT: 40.2 % (ref 39.0–52.0)
Hemoglobin: 13.5 g/dL (ref 13.0–17.0)
MCH: 28.1 pg (ref 26.0–34.0)
MCHC: 33.6 g/dL (ref 30.0–36.0)
MCV: 83.6 fL (ref 78.0–100.0)
PLATELETS: 274 10*3/uL (ref 150–400)
RBC: 4.81 MIL/uL (ref 4.22–5.81)
RDW: 15.1 % (ref 11.5–15.5)
WBC: 6.7 10*3/uL (ref 4.0–10.5)

## 2016-11-14 LAB — BASIC METABOLIC PANEL
Anion gap: 8 (ref 5–15)
BUN: 12 mg/dL (ref 6–20)
CALCIUM: 9.2 mg/dL (ref 8.9–10.3)
CO2: 24 mmol/L (ref 22–32)
CREATININE: 0.71 mg/dL (ref 0.61–1.24)
Chloride: 105 mmol/L (ref 101–111)
GFR calc non Af Amer: >60 mL/min (ref >60)
GLUCOSE: 110 mg/dL — AB (ref 65–99)
Potassium: 3.7 mmol/L (ref 3.5–5.1)
Sodium: 137 mmol/L (ref 135–145)

## 2016-11-14 LAB — PROTIME-INR
INR: 1.22
PROTHROMBIN TIME: 15.3 s — AB (ref 11.4–15.2)

## 2016-11-14 LAB — APTT: aPTT: 26 seconds (ref 24–36)

## 2016-11-14 MED ORDER — GLUCAGON HCL RDNA (DIAGNOSTIC) 1 MG IJ SOLR
INTRAMUSCULAR | Status: AC | PRN
  Administered 2016-11-14: 1 mg via INTRAVENOUS

## 2016-11-14 MED ORDER — LIDOCAINE HCL (PF) 1 % IJ SOLN
INTRAMUSCULAR | Status: AC | PRN
  Administered 2016-11-14: 5 mL

## 2016-11-14 MED ORDER — MIDAZOLAM HCL 2 MG/2ML IJ SOLN
INTRAMUSCULAR | Status: AC | PRN
  Administered 2016-11-14: 1 mg via INTRAVENOUS

## 2016-11-14 MED ORDER — IOPAMIDOL (ISOVUE-300) INJECTION 61%
INTRAVENOUS | Status: AC
  Administered 2016-11-14: 20 mL
  Filled 2016-11-14: qty 50

## 2016-11-14 MED ORDER — CEFAZOLIN SODIUM-DEXTROSE 2-4 GM/100ML-% IV SOLN
INTRAVENOUS | Status: AC
  Filled 2016-11-14: qty 100

## 2016-11-14 MED ORDER — MIDAZOLAM HCL 2 MG/2ML IJ SOLN
INTRAMUSCULAR | Status: AC
  Filled 2016-11-14: qty 2

## 2016-11-14 MED ORDER — HEPARIN SOD (PORK) LOCK FLUSH 100 UNIT/ML IV SOLN
INTRAVENOUS | Status: AC
  Filled 2016-11-14: qty 5

## 2016-11-14 MED ORDER — GLUCAGON HCL RDNA (DIAGNOSTIC) 1 MG IJ SOLR
INTRAMUSCULAR | Status: AC
  Filled 2016-11-14: qty 1

## 2016-11-14 MED ORDER — CEFAZOLIN SODIUM-DEXTROSE 2-4 GM/100ML-% IV SOLN: 2.0000 g | Freq: Once | INTRAVENOUS | Status: DC

## 2016-11-14 MED ORDER — FENTANYL CITRATE (PF) 100 MCG/2ML IJ SOLN
INTRAMUSCULAR | Status: AC | PRN
  Administered 2016-11-14: 50 ug via INTRAVENOUS

## 2016-11-14 MED ORDER — LIDOCAINE HCL 1 % IJ SOLN
INTRAMUSCULAR | Status: AC
  Filled 2016-11-14: qty 20

## 2016-11-14 MED ORDER — FENTANYL CITRATE (PF) 100 MCG/2ML IJ SOLN
INTRAMUSCULAR | Status: AC
  Filled 2016-11-14: qty 2

## 2016-11-14 MED ORDER — SODIUM CHLORIDE 0.9 % IV SOLN: INTRAVENOUS | Status: DC

## 2016-11-14 NOTE — H&P (Signed)
Chief Complaint: Patient was seen in consultation today for percutaneous gastric tube placement at the request of Barney Drain L  Referring Physician(s): Danie Binder  Supervising Physician: Sandi Mariscal  Patient Status: [name] - Out-pt  History of Present Illness: Mark Kramer is a 56 y.o. male   Long Hx Multiple Sclerosis Lives in [place] in [place] Worsening dysphagia over several months Wt loss Protein calorie malnutrition Long term care Attempt with Dr Artis Flock for endo G tube 10/19---unsuccessful Imaging reviewed with Dr Lyndel Pleasure procedure Now scheduled for percutaneous gastric tube placement   Past Medical History:  Diagnosis Date  . Grave's disease   . High cholesterol   . Hypothyroidism   . Multiple sclerosis ([place])     Past Surgical History:  Procedure Laterality Date  . APPENDECTOMY    . BIOPSY  10/31/2016   Procedure: BIOPSY;  Surgeon: Danie Binder, MD;  Location: [facility];  Service: Endoscopy;;  gastric   . ESOPHAGOGASTRODUODENOSCOPY N/A 10/31/2016   Procedure: ESOPHAGOGASTRODUODENOSCOPY (EGD);  Surgeon: Danie Binder, MD;  Location: [facility];  Service: Endoscopy;  Laterality: N/A;  2:00pm    Allergies: Patient has no known allergies.  Medications: Prior to Admission medications   Medication Sig Start Date End Date Taking? Authorizing Provider  acetaminophen (TYLENOL) 650 MG CR tablet Take 650 mg by mouth every 8 (eight) hours as needed for pain.    [provider]  ALPRAZolam Duanne Moron) 0.25 MG tablet Take 1 tablet (0.25 mg total) by mouth daily. 10/08/16   Orson Eva, MD  amLODipine (NORVASC) 10 MG tablet Take 1 tablet (10 mg total) by mouth daily. 04/13/12   Oswald Hillock, MD  atorvastatin (LIPITOR) 80 MG tablet Take 80 mg by mouth daily.    [provider]  busPIRone (BUSPAR) 7.5 MG tablet Take 7.5 mg by mouth 2 (two) times daily.    [provider]  calcium carbonate (TUMS - DOSED IN MG  ELEMENTAL CALCIUM) 500 MG chewable tablet Chew 1 tablet by mouth 2 (two) times daily.    [provider]  cholecalciferol (VITAMIN D) 1000 UNITS tablet Take 1,000 Units by mouth daily.     [provider]  DULoxetine (CYMBALTA) 60 MG capsule Take 60 mg by mouth daily.    [provider]  Fingolimod HCl (GILENYA) 0.5 MG CAPS Take 1 capsule by mouth daily. For Multiple Sclerosis    [provider]  furosemide (LASIX) 20 MG tablet Take 20 mg by mouth daily.    [provider]  HYDROcodone-acetaminophen (NORCO/VICODIN) 5-325 MG tablet Take 1 tablet by mouth every 8 (eight) hours as needed for moderate pain. 10/08/16   Orson Eva, MD  ibuprofen (ADVIL,MOTRIN) 800 MG tablet Take 800 mg by mouth every 8 (eight) hours as needed for mild pain or moderate pain.  06/18/15   [provider]  levofloxacin (LEVAQUIN) 750 MG tablet Take 750 mg by mouth daily. For pneumonia    [provider]  levothyroxine (SYNTHROID, LEVOTHROID) 112 MCG tablet Take 112 mcg by mouth daily before breakfast.    [provider]  Multiple Vitamin (MULTIVITAMIN) capsule Take 1 capsule by mouth daily.    [provider]  nadolol (CORGARD) 80 MG tablet Take 80 mg by mouth 2 (two) times daily.     [provider]  polyethylene glycol powder (GLYCOLAX/MIRALAX) powder Take by mouth daily.  01/18/15   [provider]  primidone (MYSOLINE) 50 MG tablet Take 50 mg by mouth  2 (two) times daily. 50mg  qam & 100mg  qhs    [provider]  risperiDONE (RISPERDAL) 3 MG tablet Take 3 mg by mouth 2 (two) times daily. For depression    [provider]     History reviewed. No pertinent family history.  Social History   Social History  . Marital status: Divorced    Spouse name: N/A  . Number of children: N/A  . Years of education: N/A   Social History Main Topics  . Smoking status: Never Smoker  . Smokeless tobacco: Never Used  .  Alcohol use No  . Drug use: No  . Sexual activity: No   Other Topics Concern  . None   Social History Narrative  . None    Review of Systems: A 12 point ROS discussed and pertinent positives are indicated in the HPI above.  All other systems are negative.  Review of Systems  Constitutional: Positive for unexpected weight change. Negative for activity change and fever.  Respiratory: Negative for shortness of breath.   Neurological: Positive for weakness.    Vital Signs: BP 118/62   Pulse (!) 55   Temp 97.7 F (36.5 C)   Ht 6\' 1"  (1.854 m)   Wt 205 lb (93 kg)   SpO2 94%   BMI 27.05 kg/m   Physical Exam  Cardiovascular: Normal rate and regular rhythm.   Pulmonary/Chest: Effort normal and breath sounds normal.  Abdominal: Soft. Bowel sounds are normal.  Neurological:  Can say name and dob  Skin: Skin is warm and dry.  Psychiatric:  Parents at bedside Consented for procedure  Nursing note and vitals reviewed.   Imaging: Ct Abdomen Wo Contrast  Result Date: 11/08/2016 CLINICAL DATA:  Abnormal weight loss.  Nonlocalized abdominal pain. EXAM: CT ABDOMEN WITHOUT CONTRAST TECHNIQUE: Multidetector CT imaging of the abdomen was performed following the standard protocol without IV contrast. COMPARISON:  09/30/2005 FINDINGS: Lower chest: Small bilateral pleural effusions. Hepatobiliary: No masses visualized on this unenhanced exam. Gallbladder is unremarkable. Pancreas: No mass or inflammatory process visualized on this unenhanced exam. Spleen:  Within normal limits in size. Adrenals/Urinary tract: Small calculi within upper and lower poles of the right kidney, largest measuring 5 mm. No evidence of hydronephrosis. Stomach/Bowel: Visualized intra-abdominal portions are unremarkable. Vascular/Lymphatic: No pathologically enlarged lymph nodes identified. No evidence of abdominal aortic aneurysm. Aortic atherosclerosis. Other:  None. Musculoskeletal:  No suspicious bone lesions  identified. IMPRESSION: Tiny nonobstructing right renal calculi. No evidence of hydronephrosis. Small bilateral pleural effusions. Aortic atherosclerosis. Electronically Signed   By: Earle Gell M.D.   On: 11/08/2016 18:00    Labs:  CBC:  Recent Labs  10/01/16 0915 10/02/16 0631 10/03/16 1540 11/14/16 1144  WBC 10.4 7.8 6.0 6.7  HGB 16.0 15.0 16.1 13.5  HCT 48.9 45.9 48.1 40.2  PLT 207 223 257 274    COAGS:  Recent Labs  11/14/16 1144  INR 1.22  APTT 26    BMP:  Recent Labs  10/01/16 0915 10/02/16 0631 10/03/16 1540 10/05/16 0709  NA 140 138 136 139  K 4.1 4.0 3.8 4.0  CL 102 103 99* 103  CO2 28 24 27 28   GLUCOSE 114* 92 97 106*  BUN 18 16 11 7   CALCIUM 9.4 8.9 9.2 9.3  CREATININE 0.89 0.79 0.77 0.69  GFRNONAA >60 >60 >60 >60  GFRAA >60 >60 >60 >60    LIVER FUNCTION TESTS:  Recent Labs  10/01/16 0915  BILITOT 1.1  AST 54*  ALT 27  ALKPHOS 122  PROT 7.6  ALBUMIN 3.6    TUMOR MARKERS: No results for input(s): AFPTM, CEA, CA199, CHROMGRNA in the last 8760 hours.  Assessment and Plan:  MS; worsening dysphagia Wt loss Long term care Unsuccessful Endo G tube with Dr Artis Flock 10/19 Scheduled for percutaneous gastric tube placement today Risks and benefits discussed with the patient's parents including, but not limited to the need for a barium enema during the procedure, bleeding, infection, peritonitis, or damage to adjacent structures. All of the their questions were answered, they are agreeable to proceed. Consent signed and in chart.   Thank you for this interesting consult.  I greatly enjoyed meeting Tay Whitwell and look forward to participating in their care.  A copy of this report was sent to the requesting provider on this date.  Electronically Signed: Lavonia Drafts, PA-C 11/14/2016, 12:34 PM   I spent a total of  30 Minutes   in face to face in clinical consultation, greater than 50% of which was counseling/coordinating care for  percutaneous gastric tube placement

## 2016-11-14 NOTE — Sedation Documentation (Signed)
Discharge at 4pm.

## 2016-11-14 NOTE — Progress Notes (Signed)
Thayer Headings walker RN flushed picc line w heparin 500u per 5 ml  Before sending pt home,

## 2016-11-14 NOTE — Discharge Instructions (Signed)
PER DR WATTS, MAY USE FEEDING TUBE IN 24 HOURS/ MED TUBE NOW Gastrostomy Tube Home Guide, Adult A gastrostomy tube is a tube that is surgically placed into the stomach. It is also called a G-tube. G-tubes are used when a person is unable to eat and drink enough on their own to stay healthy. The tube is inserted into the stomach through a small cut (incision) in the skin. This tube is used for:  Feeding.  Giving medication.  Gastrostomy tube care  Wash your hands with soap and water.  Remove the old dressing (if any). Some styles of G-tubes may need a dressing inserted between the skin and the G-tube. Other types of G-tubes do not require a dressing. Ask your health care provider if a dressing is needed.  Check the area where the tube enters the skin (insertion site) for redness, swelling, or pus-like (purulent) drainage. A small amount of clear or tan liquid drainage is normal. Check to make sure scar tissue (skin) is not growing around the insertion site. This could have a raised, bumpy appearance.  A cotton swab can be used to clean the skin around the tube: ? When the G-tube is first put in, a normal saline solution or water can be used to clean the skin. ? Mild soap and warm water can be used when the skin around the G-tube site has healed. ? Roll the cotton swab around the G-tube insertion site to remove any drainage or crusting at the insertion site. Stomach residuals Feeding tube residuals are the amount of liquids that are in the stomach at any given time. Residuals may be checked before giving feedings, medications, or as instructed by your health care provider.  Ask your health care provider if there are instances when you would not start tube feedings depending on the amount or type of contents withdrawn from the stomach.  Check residuals by attaching a syringe to the G-tube and pulling back on the syringe plunger. Note the amount, and return the residual back into the  stomach.  Flushing the G-tube  The G-tube should be periodically flushed with clean warm water to keep it from clogging. ? Flush the G-tube after feedings or medications. Draw up 30 mL of warm water in a syringe. Connect the syringe to the G-tube and slowly push the water into the tube. ? Do not push feedings, medications, or flushes rapidly. Flush the G-tube gently and slowly. ? Only use syringes made for G-tubes to flush medications or feedings. ? Your health care provider may want the G-tube flushed more often or with more water. If this is the case, follow your health care provider's instructions. Feedings Your health care provider will determine whether feedings are given as a bolus (a certain amount given at one time and at scheduled times) or whether feedings will be given continuously on a feeding pump.  Formulas should be given at room temperature.  If feedings are continuous, no more than 4 hours worth of feedings should be placed in the feeding bag. This helps prevent spoilage or accidental excess infusion.  Cover and place unused formula in the refrigerator.  If feedings are continuous, stop the feedings when medications or flushes are given. Be sure to restart the feedings.  Feeding bags and syringes should be replaced as instructed by your health care provider.  Giving medication  In general, it is best if all medications are in a liquid form for G-tube administration. Liquid medications are less likely to clog  the G-tube. ? Mix the liquid medication with 30 mL (or amount recommended by your health care provider) of warm water. ? Draw up the medication into the syringe. ? Attach the syringe to the G-tube and slowly push the mixture into the G-tube. ? After giving the medication, draw up 30 mL of warm water in the syringe and slowly flush the G-tube.  For pills or capsules, check with your health care provider first before crushing medications. Some pills are not effective  if they are crushed. Some capsules are sustained-release medications. ? If appropriate, crush the pill or capsule and mix with 30 mL of warm water. Using the syringe, slowly push the medication through the tube, then flush the tube with another 30 mL of tap water. G-tube problems G-tube was pulled out.  Cause: May have been pulled out accidentally.  Solutions: Cover the opening with clean dressing and tape. Call your health care provider right away. The G-tube should be put in as soon as possible (within 4 hours) so the G-tube opening (tract) does not close. The G-tube needs to be put in at a health care setting. An X-ray needs to be done to confirm placement before the G-tube can be used again.  Redness, irritation, soreness, or foul odor around the gastrostomy site.  Cause: May be caused by leakage or infection.  Solutions: Call your health care provider right away.  Large amount of leakage of fluid or mucus-like liquid present (a large amount means it soaks clothing).  Cause: Many reasons could cause the G-tube to leak.  Solutions: Call your health care provider to discuss the amount of leakage.  Skin or scar tissue appears to be growing where tube enters skin.  Cause: Tissue growth may develop around the insertion site if the G-tube is moved or pulled on excessively.  Solutions: Secure tube with tape so that excess movement does not occur. Call your health care provider.  G-tube is clogged.  Cause: Thick formula or medication.  Solutions: Try to slowly push warm water into the tube with a large syringe. Never try to push any object into the tube to unclog it. Do not force fluid into the G-tube. If you are unable to unclog the tube, call your health care provider right away.  Tips  Head of bed (HOB) position refers to the upright position of a person's upper body. ? When giving medications or a feeding bolus, keep the [facility] up as told by your health care provider. Do this during  the feeding and for 1 hour after the feeding or medication administration. ? If continuous feedings are being given, it is best to keep the [facility] up as told by your health care provider. When ADLs (activities of daily living) are performed and the [facility] needs to be flat, be sure to turn the feeding pump off. Restart the feeding pump when the [facility] is returned to the recommended height.  Do not pull or put tension on the tube.  To prevent fluid backflow, kink the G-tube before removing the cap or disconnecting a syringe.  Check the G-tube length every day. Measure from the insertion site to the end of the G-tube. If the length is longer than previous measurements, the tube may be coming out. Call your health care provider if you notice increasing G-tube length.  Oral care, such as brushing teeth, must be continued.  You may need to remove excess air (vent) from the G-tube. Your health care provider will tell you if  this is needed.  Always call your health care provider if you have questions or problems with the G-tube. Get help right away if:  You have severe abdominal pain, tenderness, or abdominal bloating (distension).  You have nausea or vomiting.  You are constipated or have problems moving your bowels.  The G-tube insertion site is red, swollen, has a foul smell, or has yellow or brown drainage.  You have difficulty breathing or shortness of breath.  You have a fever.  You have a large amount of feeding tube residuals.  The G-tube is clogged and cannot be flushed. This information is not intended to replace advice given to you by your health care provider. Make sure you discuss any questions you have with your health care provider. Document Released: 03/10/2001 Document Revised: 06/07/2015 Document Reviewed: 09/06/2012 Elsevier Interactive Patient Education  2017 [employer].

## 2016-11-14 NOTE — Sedation Documentation (Signed)
Patient is resting comfortably. 

## 2016-11-14 NOTE — Procedures (Signed)
Pre procedure Dx: Dysphagia Post Procedure Dx: Same  Successful fluoroscopic guided insertion of gastrostomy tube.   The gastrostomy tube may be used immediately for medications.   Tube feeds may be initiated in 24 hours as per the primary team.    EBL: Minimal  Complications: None immediate  Jay Rehanna Oloughlin, MD Pager #: [phone]    

## 2016-11-17 DIAGNOSIS — L89610 Pressure ulcer of right heel, unstageable: Secondary | ICD-10-CM | POA: Diagnosis not present

## 2016-11-17 DIAGNOSIS — Z931 Gastrostomy status: Secondary | ICD-10-CM | POA: Diagnosis not present

## 2016-11-17 DIAGNOSIS — G35 Multiple sclerosis: Secondary | ICD-10-CM | POA: Diagnosis not present

## 2016-11-17 DIAGNOSIS — R634 Abnormal weight loss: Secondary | ICD-10-CM | POA: Diagnosis not present

## 2016-11-18 DIAGNOSIS — G35 Multiple sclerosis: Secondary | ICD-10-CM | POA: Diagnosis not present

## 2016-11-18 DIAGNOSIS — R0989 Other specified symptoms and signs involving the circulatory and respiratory systems: Secondary | ICD-10-CM | POA: Diagnosis not present

## 2016-11-18 DIAGNOSIS — R609 Edema, unspecified: Secondary | ICD-10-CM | POA: Diagnosis not present

## 2016-11-20 NOTE — Progress Notes (Signed)
PT's mom, Paula Zietz, is aware.

## 2016-11-21 ENCOUNTER — Other Ambulatory Visit: Payer: Self-pay | Admitting: Neurology

## 2016-11-25 ENCOUNTER — Other Ambulatory Visit: Payer: Self-pay | Admitting: Neurology

## 2016-11-25 DIAGNOSIS — G35 Multiple sclerosis: Secondary | ICD-10-CM

## 2016-12-01 ENCOUNTER — Ambulatory Visit (HOSPITAL_COMMUNITY)
Admission: RE | Admit: 2016-12-01 | Discharge: 2016-12-01 | Disposition: A | Discharge status: Home / Self Care | Payer: Medicare Other | Source: Ambulatory Visit | Attending: Neurology | Admitting: Neurology

## 2016-12-01 ENCOUNTER — Encounter (HOSPITAL_COMMUNITY): Payer: Self-pay

## 2016-12-01 DIAGNOSIS — G35 Multiple sclerosis: Secondary | ICD-10-CM

## 2016-12-01 DIAGNOSIS — L98419 Non-pressure chronic ulcer of buttock with unspecified severity: Secondary | ICD-10-CM | POA: Diagnosis not present

## 2016-12-08 DIAGNOSIS — L89610 Pressure ulcer of right heel, unstageable: Secondary | ICD-10-CM | POA: Diagnosis not present

## 2016-12-12 DIAGNOSIS — F411 Generalized anxiety disorder: Secondary | ICD-10-CM | POA: Diagnosis not present

## 2016-12-12 DIAGNOSIS — F321 Major depressive disorder, single episode, moderate: Secondary | ICD-10-CM | POA: Diagnosis not present

## 2016-12-15 DIAGNOSIS — L89610 Pressure ulcer of right heel, unstageable: Secondary | ICD-10-CM | POA: Diagnosis not present

## 2016-12-28 ENCOUNTER — Encounter (HOSPITAL_COMMUNITY): Payer: Self-pay | Admitting: Emergency Medicine

## 2016-12-28 ENCOUNTER — Other Ambulatory Visit: Payer: Self-pay

## 2016-12-28 ENCOUNTER — Emergency Department (HOSPITAL_COMMUNITY)
Admission: EM | Admit: 2016-12-28 | Discharge: 2016-12-28 | Disposition: A | Discharge status: Skilled Nursing Facility | Payer: Medicare Other | Attending: Emergency Medicine | Admitting: Emergency Medicine

## 2016-12-28 ENCOUNTER — Emergency Department (HOSPITAL_COMMUNITY): Payer: Medicare Other

## 2016-12-28 DIAGNOSIS — G35 Multiple sclerosis: Secondary | ICD-10-CM | POA: Insufficient documentation

## 2016-12-28 DIAGNOSIS — R531 Weakness: Secondary | ICD-10-CM | POA: Diagnosis not present

## 2016-12-28 DIAGNOSIS — Z79899 Other long term (current) drug therapy: Secondary | ICD-10-CM | POA: Diagnosis not present

## 2016-12-28 HISTORY — DX: Muscle weakness (generalized): M62.81

## 2016-12-28 HISTORY — DX: Pneumonia, unspecified organism: J18.9

## 2016-12-28 HISTORY — DX: Dysphagia, unspecified: R13.10

## 2016-12-28 LAB — CBC WITH DIFFERENTIAL/PLATELET
Basophils Absolute: 0 10*3/uL (ref 0.0–0.1)
Basophils Relative: 0 %
Eosinophils Absolute: 0 10*3/uL (ref 0.0–0.7)
Eosinophils Relative: 0 %
HEMATOCRIT: 43.4 % (ref 39.0–52.0)
Hemoglobin: 14 g/dL (ref 13.0–17.0)
Lymphocytes Relative: 6 %
Lymphs Abs: 0.6 10*3/uL — ABNORMAL LOW (ref 0.7–4.0)
MCH: 28 pg (ref 26.0–34.0)
MCHC: 32.3 g/dL (ref 30.0–36.0)
MCV: 86.8 fL (ref 78.0–100.0)
Monocytes Absolute: 1.4 10*3/uL — ABNORMAL HIGH (ref 0.1–1.0)
Monocytes Relative: 16 %
NEUTROS ABS: 6.7 10*3/uL (ref 1.7–7.7)
NEUTROS PCT: 78 %
Platelets: 218 10*3/uL (ref 150–400)
RBC: 5 MIL/uL (ref 4.22–5.81)
RDW: 18.1 % — ABNORMAL HIGH (ref 11.5–15.5)
WBC: 8.7 10*3/uL (ref 4.0–10.5)

## 2016-12-28 LAB — COMPREHENSIVE METABOLIC PANEL
ALT: 17 U/L (ref 17–63)
AST: 28 U/L (ref 15–41)
Albumin: 3.4 g/dL — ABNORMAL LOW (ref 3.5–5.0)
Alkaline Phosphatase: 93 U/L (ref 38–126)
Anion gap: 7 (ref 5–15)
BILIRUBIN TOTAL: 0.7 mg/dL (ref 0.3–1.2)
BUN: 16 mg/dL (ref 6–20)
CHLORIDE: 104 mmol/L (ref 101–111)
CO2: 25 mmol/L (ref 22–32)
CREATININE: 0.85 mg/dL (ref 0.61–1.24)
Calcium: 9.3 mg/dL (ref 8.9–10.3)
GFR calc Af Amer: >60 mL/min (ref >60)
GLUCOSE: 133 mg/dL — AB (ref 65–99)
Potassium: 4.3 mmol/L (ref 3.5–5.1)
Sodium: 136 mmol/L (ref 135–145)
TOTAL PROTEIN: 6.9 g/dL (ref 6.5–8.1)

## 2016-12-28 LAB — URINALYSIS, ROUTINE W REFLEX MICROSCOPIC
Bilirubin Urine: NEGATIVE
Glucose, UA: NEGATIVE mg/dL
Hgb urine dipstick: NEGATIVE
KETONES UR: NEGATIVE mg/dL
Nitrite: NEGATIVE
PH: 7 (ref 5.0–8.0)
Protein, ur: NEGATIVE mg/dL
Specific Gravity, Urine: 1.011 (ref 1.005–1.030)

## 2016-12-28 MED ORDER — SODIUM CHLORIDE 0.9 % IV BOLUS (SEPSIS)
1000.0000 mL | Freq: Once | INTRAVENOUS | Status: AC
  Administered 2016-12-28: 1000 mL via INTRAVENOUS

## 2016-12-28 NOTE — ED Notes (Signed)
In and out catheterization completed by Anderson Malta and Melanee Spry was unsuccessful. Firm prostate palpated. Will bladder scan and re-attempt in 45 minutes.

## 2016-12-28 NOTE — ED Notes (Signed)
Patient transported to X-ray 

## 2016-12-28 NOTE — ED Notes (Signed)
Pt residence faxed over pt MOST form. Form placed at bedside.

## 2016-12-28 NOTE — ED Notes (Signed)
Pt turned on left side, pillow placed behind back.

## 2016-12-28 NOTE — ED Provider Notes (Signed)
[facility] EMERGENCY DEPARTMENT Provider Note   CSN: [id number] Arrival date & time: 12/28/16  1235     History   Chief Complaint Chief Complaint  Patient presents with  . Altered Mental Status    HPI Mark Kramer is a 56 y.o. male.  HPI Patient presents to the emergency room for evaluation of an episode of weakness/unresponsiveness.  Patient is a resident of a nursing home.  Patient has end-stage multiple sclerosis.  Patient is no longer able to walk.  According to the EMS report the staff at the facility and found that the patient was unresponsive while sitting in a wheelchair for period of time.  They were unable to indicate when this started.  They were unable to indicate when the last time the patient was normal.  Here in the emergency room the patient is awake and answers questions appropriately.  He denies having any complaints.  He denies having headache, chest pain, abdominal pain.  He denies any vomiting or diarrhea.  He denies any fevers. Past Medical History:  Diagnosis Date  . Dysphagia   . Grave's disease   . High cholesterol   . Hypothyroidism   . Multiple sclerosis ([place])   . Muscle weakness   . Pneumonia     Patient Active Problem List   Diagnosis Date Noted  . Failure to thrive (child) 10/31/2016  . Pneumonia 10/31/2016  . Gastritis and gastroduodenitis   . Dysphagia 10/05/2016  . Sepsis due to pneumonia ([place]) 10/04/2016  . Acute metabolic encephalopathy 31/51/7616  . Acute respiratory failure with hypoxia ([place]) 10/04/2016  . Pressure injury of skin 10/02/2016  . Decubitus ulcer of right buttock, stage 1 10/02/2016  . Tremor 07/26/2014  . Other fatigue 07/26/2014  . Urge incontinence 07/26/2014  . Cognitive dysfunction 07/26/2014  . Depression 07/26/2014  . Multiple sclerosis ([place]) 04/09/2012  . Graves disease 04/09/2012  . Dyslipidemia 04/09/2012  . Altered mental status 04/09/2012  . UTI (urinary tract infection) 04/09/2012    Past Surgical  History:  Procedure Laterality Date  . APPENDECTOMY    . BIOPSY  10/31/2016   Procedure: BIOPSY;  Surgeon: Danie Binder, MD;  Location: [facility] ENDO SUITE;  Service: Endoscopy;;  gastric   . ESOPHAGOGASTRODUODENOSCOPY N/A 10/31/2016   Procedure: ESOPHAGOGASTRODUODENOSCOPY (EGD);  Surgeon: Danie Binder, MD;  Location: [facility] ENDO SUITE;  Service: Endoscopy;  Laterality: N/A;  2:00pm  . IR GASTROSTOMY TUBE MOD SED  11/14/2016       Home Medications    Prior to Admission medications   Medication Sig Start Date End Date Taking? Authorizing Provider  acetaminophen (TYLENOL) 650 MG CR tablet Take 650 mg by mouth every 8 (eight) hours as needed for pain.   Yes [provider]  ALPRAZolam (XANAX) 0.25 MG tablet Take 1 tablet (0.25 mg total) by mouth daily. 10/08/16  Yes Tat, Shanon Brow, MD  amLODipine (NORVASC) 10 MG tablet Take 1 tablet (10 mg total) by mouth daily. 04/13/12  Yes Oswald Hillock, MD  atorvastatin (LIPITOR) 80 MG tablet Take 80 mg by mouth daily.   Yes [provider]  busPIRone (BUSPAR) 7.5 MG tablet Take 7.5 mg by mouth 2 (two) times daily.   Yes [provider]  calcium carbonate (TUMS - DOSED IN MG ELEMENTAL CALCIUM) 500 MG chewable tablet Chew 1 tablet by mouth 2 (two) times daily.   Yes [provider]  cholecalciferol (VITAMIN D) 1000 UNITS tablet Take 1,000 Units by mouth daily.    Yes  [provider]  DULoxetine (CYMBALTA) 60 MG capsule Take 60 mg by mouth daily.   Yes [provider]  Fingolimod HCl (GILENYA) 0.5 MG CAPS Take 1 capsule by mouth daily. For Multiple Sclerosis   Yes [provider]  furosemide (LASIX) 20 MG tablet Take 20 mg by mouth daily.   Yes [provider]  HYDROcodone-acetaminophen (NORCO/VICODIN) 5-325 MG tablet Take 1 tablet by mouth every 8 (eight) hours as needed for moderate pain. 10/08/16  Yes Tat, Shanon Brow, MD  ipratropium-albuterol (DUONEB) 0.5-2.5 (3) MG/3ML SOLN Take 3 mLs by  nebulization every 6 (six) hours as needed.   Yes [provider]  lansoprazole (PREVACID) 30 MG capsule Take 30 mg by mouth 2 (two) times daily.   Yes [provider]  levothyroxine (SYNTHROID, LEVOTHROID) 112 MCG tablet Take 112 mcg by mouth daily before breakfast.   Yes [provider]  Multiple Vitamin (MULTIVITAMIN) capsule Take 1 capsule by mouth daily.   Yes [provider]  nadolol (CORGARD) 80 MG tablet Take 80 mg by mouth 2 (two) times daily.    Yes [provider]  polyethylene glycol powder (GLYCOLAX/MIRALAX) powder Take by mouth daily.  01/18/15  Yes [provider]  primidone (MYSOLINE) 50 MG tablet Take 50-100 mg by mouth 2 (two) times daily. 50mg  qam & 100mg  qhs   Yes [provider]  ibuprofen (ADVIL,MOTRIN) 800 MG tablet Take 800 mg by mouth every 8 (eight) hours as needed for mild pain or moderate pain.  06/18/15   [provider]    Family History History reviewed. No pertinent family history.  Social History Social History   Tobacco Use  . Smoking status: Never Smoker  . Smokeless tobacco: Never Used  Substance Use Topics  . Alcohol use: No  . Drug use: No     Allergies   Patient has no known allergies.   Review of Systems Review of Systems  All other systems reviewed and are negative.    Physical Exam Updated Vital Signs BP 104/60 (BP Location: Left Arm)   Pulse (!) 57   Temp 98 F (36.7 C) (Rectal)   Resp 17   Ht 1.803 m (5\' 11" )   Wt 90.7 kg (200 lb)   SpO2 100%   BMI 27.89 kg/m   Physical Exam  Constitutional: He appears listless. No distress.  HENT:  Head: Normocephalic and atraumatic.  Right Ear: External ear normal.  Left Ear: External ear normal.  Eyes: Conjunctivae are normal. Right eye exhibits no discharge. Left eye exhibits no discharge. No scleral icterus.  Neck: Neck supple. No tracheal deviation present.  Cardiovascular: Normal rate, regular rhythm and intact  distal pulses.  Pulmonary/Chest: Effort normal and breath sounds normal. No stridor. No respiratory distress. He has no wheezes. He has no rales.  Abdominal: Soft. Bowel sounds are normal. He exhibits no distension. There is no tenderness. There is no rebound and no guarding.  Feeding tube in place, no erythema or drainage at the site  Musculoskeletal: He exhibits no edema or tenderness.  Neurological: He appears listless. No cranial nerve deficit (no facial droop, extraocular movements intact, no slurred speech ) or sensory deficit. He exhibits abnormal muscle tone. He displays no seizure activity.  Patient is slow to answer questions but answers appropriately.  Unable to move his extremities  Skin: Skin is warm and dry. No rash noted. He is not diaphoretic.  Psychiatric: He has a normal mood and affect.  Nursing note and vitals  reviewed.    ED Treatments / Results  Labs (all labs ordered are listed, but only abnormal results are displayed) Labs Reviewed  CBC WITH DIFFERENTIAL/PLATELET - Abnormal; Notable for the following components:      Result Value   RDW 18.1 (*)    Lymphs Abs 0.6 (*)    Monocytes Absolute 1.4 (*)    All other components within normal limits  COMPREHENSIVE METABOLIC PANEL - Abnormal; Notable for the following components:   Glucose, Bld 133 (*)    Albumin 3.4 (*)    All other components within normal limits  URINALYSIS, ROUTINE W REFLEX MICROSCOPIC - Abnormal; Notable for the following components:   APPearance HAZY (*)    Leukocytes, UA LARGE (*)    Bacteria, UA RARE (*)    Squamous Epithelial / LPF 0-5 (*)    All other components within normal limits    EKG  EKG Interpretation  Date/Time:  Sunday December 28 2016 13:35:06 EST Ventricular Rate:  56 PR Interval:    QRS Duration: 95 QT Interval:  401 QTC Calculation: 387 R Axis:   55 Text Interpretation:  probable sinus rhythm Abnormal R-wave progression, early transition Minimal ST elevation, inferior  leads Poor data quality Confirmed by Dorie Rank [phone]) on 12/28/2016 4:42:13 PM       Radiology Dg Chest 2 View  Result Date: 12/28/2016 CLINICAL DATA:  Weakness EXAM: CHEST  2 VIEW COMPARISON:  10/05/2016 FINDINGS: Cardiac shadow is stable. Previously seen atelectatic changes have resolved. No focal infiltrate or sizable effusion is seen. No acute bony abnormality is noted. IMPRESSION: No active cardiopulmonary disease. Electronically Signed   By: Inez Catalina M.D.   On: 12/28/2016 14:28    Procedures Procedures (including critical care time)  Medications Ordered in ED Medications  sodium chloride 0.9 % bolus 1,000 mL (1,000 mLs Intravenous New Bag/Given 12/28/16 1333)     Initial Impression / Assessment and Plan / ED Course  I have reviewed the triage vital signs and the nursing notes.  Pertinent labs & imaging results that were available during my care of the patient were reviewed by me and considered in my medical decision making (see chart for details).   Patient presented to the emergency room for evaluation of altered mental status according to his nursing facility.  Patient answers questions appropriately here.  His exam seems to be consistent with his chronic MS abnormalities.  No signs of acute infection.   Patient stepfather came to the ED as well.  Patient appears at his baseline.  I think he stable for discharge back to the nursing facility.  Final Clinical Impressions(s) / ED Diagnoses   Final diagnoses:  MS (multiple sclerosis) [facility])    ED Discharge Orders    None       Dorie Rank, MD 12/28/16 [phone]

## 2016-12-28 NOTE — Discharge Instructions (Signed)
Continue your current medications, follow-up with your neurologist as planned

## 2016-12-28 NOTE — ED Triage Notes (Signed)
Per EMS, pt from [facility] brought over related to altered mental status. Per facility staff, pt was noted to be "unresponisve while in wheelchair" unknown LKW. Pt alert and answers questions appropriately. Pt alert to self, place, disoriented to time and situation. cbg en route 140.

## 2016-12-29 DIAGNOSIS — G252 Other specified forms of tremor: Secondary | ICD-10-CM | POA: Diagnosis not present

## 2016-12-29 DIAGNOSIS — G35 Multiple sclerosis: Secondary | ICD-10-CM | POA: Diagnosis not present

## 2016-12-29 DIAGNOSIS — Z79899 Other long term (current) drug therapy: Secondary | ICD-10-CM | POA: Diagnosis not present

## 2016-12-29 DIAGNOSIS — G9349 Other encephalopathy: Secondary | ICD-10-CM | POA: Diagnosis not present

## 2016-12-30 ENCOUNTER — Other Ambulatory Visit: Payer: Self-pay | Admitting: Neurology

## 2016-12-30 DIAGNOSIS — E785 Hyperlipidemia, unspecified: Secondary | ICD-10-CM | POA: Diagnosis not present

## 2016-12-30 DIAGNOSIS — G35 Multiple sclerosis: Secondary | ICD-10-CM | POA: Diagnosis not present

## 2016-12-30 DIAGNOSIS — I10 Essential (primary) hypertension: Secondary | ICD-10-CM | POA: Diagnosis not present

## 2016-12-30 DIAGNOSIS — F329 Major depressive disorder, single episode, unspecified: Secondary | ICD-10-CM | POA: Diagnosis not present

## 2016-12-31 ENCOUNTER — Other Ambulatory Visit: Payer: Self-pay | Admitting: Neurology

## 2016-12-31 DIAGNOSIS — R4182 Altered mental status, unspecified: Secondary | ICD-10-CM

## 2017-01-16 DIAGNOSIS — J189 Pneumonia, unspecified organism: Secondary | ICD-10-CM | POA: Diagnosis not present

## 2017-01-16 DIAGNOSIS — R293 Abnormal posture: Secondary | ICD-10-CM | POA: Diagnosis not present

## 2017-01-16 DIAGNOSIS — G35 Multiple sclerosis: Secondary | ICD-10-CM | POA: Diagnosis not present

## 2017-01-16 DIAGNOSIS — M6281 Muscle weakness (generalized): Secondary | ICD-10-CM | POA: Diagnosis not present

## 2017-01-16 DIAGNOSIS — R1312 Dysphagia, oropharyngeal phase: Secondary | ICD-10-CM | POA: Diagnosis not present

## 2017-01-19 DIAGNOSIS — M6281 Muscle weakness (generalized): Secondary | ICD-10-CM | POA: Diagnosis not present

## 2017-01-19 DIAGNOSIS — J189 Pneumonia, unspecified organism: Secondary | ICD-10-CM | POA: Diagnosis not present

## 2017-01-19 DIAGNOSIS — R293 Abnormal posture: Secondary | ICD-10-CM | POA: Diagnosis not present

## 2017-01-19 DIAGNOSIS — G35 Multiple sclerosis: Secondary | ICD-10-CM | POA: Diagnosis not present

## 2017-01-19 DIAGNOSIS — R1312 Dysphagia, oropharyngeal phase: Secondary | ICD-10-CM | POA: Diagnosis not present

## 2017-01-20 DIAGNOSIS — R1312 Dysphagia, oropharyngeal phase: Secondary | ICD-10-CM | POA: Diagnosis not present

## 2017-01-20 DIAGNOSIS — G35 Multiple sclerosis: Secondary | ICD-10-CM | POA: Diagnosis not present

## 2017-01-20 DIAGNOSIS — J189 Pneumonia, unspecified organism: Secondary | ICD-10-CM | POA: Diagnosis not present

## 2017-01-20 DIAGNOSIS — M6281 Muscle weakness (generalized): Secondary | ICD-10-CM | POA: Diagnosis not present

## 2017-01-20 DIAGNOSIS — R293 Abnormal posture: Secondary | ICD-10-CM | POA: Diagnosis not present

## 2017-01-21 DIAGNOSIS — G35 Multiple sclerosis: Secondary | ICD-10-CM | POA: Diagnosis not present

## 2017-01-21 DIAGNOSIS — R293 Abnormal posture: Secondary | ICD-10-CM | POA: Diagnosis not present

## 2017-01-21 DIAGNOSIS — M6281 Muscle weakness (generalized): Secondary | ICD-10-CM | POA: Diagnosis not present

## 2017-01-21 DIAGNOSIS — J189 Pneumonia, unspecified organism: Secondary | ICD-10-CM | POA: Diagnosis not present

## 2017-01-21 DIAGNOSIS — R1312 Dysphagia, oropharyngeal phase: Secondary | ICD-10-CM | POA: Diagnosis not present

## 2017-01-22 DIAGNOSIS — F332 Major depressive disorder, recurrent severe without psychotic features: Secondary | ICD-10-CM | POA: Diagnosis not present

## 2017-01-22 DIAGNOSIS — R293 Abnormal posture: Secondary | ICD-10-CM | POA: Diagnosis not present

## 2017-01-22 DIAGNOSIS — F4323 Adjustment disorder with mixed anxiety and depressed mood: Secondary | ICD-10-CM | POA: Diagnosis not present

## 2017-01-22 DIAGNOSIS — G20 Parkinson's disease: Secondary | ICD-10-CM | POA: Diagnosis not present

## 2017-01-22 DIAGNOSIS — G35 Multiple sclerosis: Secondary | ICD-10-CM | POA: Diagnosis not present

## 2017-01-22 DIAGNOSIS — R1312 Dysphagia, oropharyngeal phase: Secondary | ICD-10-CM | POA: Diagnosis not present

## 2017-01-22 DIAGNOSIS — M6281 Muscle weakness (generalized): Secondary | ICD-10-CM | POA: Diagnosis not present

## 2017-01-22 DIAGNOSIS — J189 Pneumonia, unspecified organism: Secondary | ICD-10-CM | POA: Diagnosis not present

## 2017-01-22 DIAGNOSIS — F413 Other mixed anxiety disorders: Secondary | ICD-10-CM | POA: Diagnosis not present

## 2017-01-23 DIAGNOSIS — M6281 Muscle weakness (generalized): Secondary | ICD-10-CM | POA: Diagnosis not present

## 2017-01-23 DIAGNOSIS — J189 Pneumonia, unspecified organism: Secondary | ICD-10-CM | POA: Diagnosis not present

## 2017-01-23 DIAGNOSIS — G35 Multiple sclerosis: Secondary | ICD-10-CM | POA: Diagnosis not present

## 2017-01-23 DIAGNOSIS — R293 Abnormal posture: Secondary | ICD-10-CM | POA: Diagnosis not present

## 2017-01-23 DIAGNOSIS — R1312 Dysphagia, oropharyngeal phase: Secondary | ICD-10-CM | POA: Diagnosis not present

## 2017-01-26 DIAGNOSIS — G35 Multiple sclerosis: Secondary | ICD-10-CM | POA: Diagnosis not present

## 2017-01-26 DIAGNOSIS — M6281 Muscle weakness (generalized): Secondary | ICD-10-CM | POA: Diagnosis not present

## 2017-01-26 DIAGNOSIS — R1312 Dysphagia, oropharyngeal phase: Secondary | ICD-10-CM | POA: Diagnosis not present

## 2017-01-26 DIAGNOSIS — R293 Abnormal posture: Secondary | ICD-10-CM | POA: Diagnosis not present

## 2017-01-26 DIAGNOSIS — J189 Pneumonia, unspecified organism: Secondary | ICD-10-CM | POA: Diagnosis not present

## 2017-01-27 DIAGNOSIS — R293 Abnormal posture: Secondary | ICD-10-CM | POA: Diagnosis not present

## 2017-01-27 DIAGNOSIS — M6281 Muscle weakness (generalized): Secondary | ICD-10-CM | POA: Diagnosis not present

## 2017-01-27 DIAGNOSIS — R1312 Dysphagia, oropharyngeal phase: Secondary | ICD-10-CM | POA: Diagnosis not present

## 2017-01-27 DIAGNOSIS — J189 Pneumonia, unspecified organism: Secondary | ICD-10-CM | POA: Diagnosis not present

## 2017-01-27 DIAGNOSIS — G35 Multiple sclerosis: Secondary | ICD-10-CM | POA: Diagnosis not present

## 2017-01-28 DIAGNOSIS — R1312 Dysphagia, oropharyngeal phase: Secondary | ICD-10-CM | POA: Diagnosis not present

## 2017-01-28 DIAGNOSIS — R2689 Other abnormalities of gait and mobility: Secondary | ICD-10-CM | POA: Diagnosis not present

## 2017-01-28 DIAGNOSIS — M6281 Muscle weakness (generalized): Secondary | ICD-10-CM | POA: Diagnosis not present

## 2017-01-28 DIAGNOSIS — G8220 Paraplegia, unspecified: Secondary | ICD-10-CM | POA: Diagnosis not present

## 2017-01-28 DIAGNOSIS — J189 Pneumonia, unspecified organism: Secondary | ICD-10-CM | POA: Diagnosis not present

## 2017-01-28 DIAGNOSIS — R293 Abnormal posture: Secondary | ICD-10-CM | POA: Diagnosis not present

## 2017-01-28 DIAGNOSIS — G35 Multiple sclerosis: Secondary | ICD-10-CM | POA: Diagnosis not present

## 2017-01-29 DIAGNOSIS — F4323 Adjustment disorder with mixed anxiety and depressed mood: Secondary | ICD-10-CM | POA: Diagnosis not present

## 2017-01-29 DIAGNOSIS — G35 Multiple sclerosis: Secondary | ICD-10-CM | POA: Diagnosis not present

## 2017-01-29 DIAGNOSIS — F332 Major depressive disorder, recurrent severe without psychotic features: Secondary | ICD-10-CM | POA: Diagnosis not present

## 2017-01-29 DIAGNOSIS — J189 Pneumonia, unspecified organism: Secondary | ICD-10-CM | POA: Diagnosis not present

## 2017-01-29 DIAGNOSIS — R293 Abnormal posture: Secondary | ICD-10-CM | POA: Diagnosis not present

## 2017-01-29 DIAGNOSIS — M6281 Muscle weakness (generalized): Secondary | ICD-10-CM | POA: Diagnosis not present

## 2017-01-29 DIAGNOSIS — R1312 Dysphagia, oropharyngeal phase: Secondary | ICD-10-CM | POA: Diagnosis not present

## 2017-01-29 DIAGNOSIS — G20 Parkinson's disease: Secondary | ICD-10-CM | POA: Diagnosis not present

## 2017-01-29 DIAGNOSIS — F413 Other mixed anxiety disorders: Secondary | ICD-10-CM | POA: Diagnosis not present

## 2017-01-30 ENCOUNTER — Other Ambulatory Visit (HOSPITAL_COMMUNITY): Payer: Self-pay | Admitting: Specialist

## 2017-01-30 DIAGNOSIS — M6281 Muscle weakness (generalized): Secondary | ICD-10-CM | POA: Diagnosis not present

## 2017-01-30 DIAGNOSIS — R293 Abnormal posture: Secondary | ICD-10-CM | POA: Diagnosis not present

## 2017-01-30 DIAGNOSIS — R1312 Dysphagia, oropharyngeal phase: Secondary | ICD-10-CM | POA: Diagnosis not present

## 2017-01-30 DIAGNOSIS — R1319 Other dysphagia: Secondary | ICD-10-CM

## 2017-01-30 DIAGNOSIS — G35 Multiple sclerosis: Secondary | ICD-10-CM | POA: Diagnosis not present

## 2017-01-30 DIAGNOSIS — J189 Pneumonia, unspecified organism: Secondary | ICD-10-CM | POA: Diagnosis not present

## 2017-02-02 DIAGNOSIS — G35 Multiple sclerosis: Secondary | ICD-10-CM | POA: Diagnosis not present

## 2017-02-02 DIAGNOSIS — R1312 Dysphagia, oropharyngeal phase: Secondary | ICD-10-CM | POA: Diagnosis not present

## 2017-02-02 DIAGNOSIS — R293 Abnormal posture: Secondary | ICD-10-CM | POA: Diagnosis not present

## 2017-02-02 DIAGNOSIS — M6281 Muscle weakness (generalized): Secondary | ICD-10-CM | POA: Diagnosis not present

## 2017-02-02 DIAGNOSIS — J189 Pneumonia, unspecified organism: Secondary | ICD-10-CM | POA: Diagnosis not present

## 2017-02-03 DIAGNOSIS — R293 Abnormal posture: Secondary | ICD-10-CM | POA: Diagnosis not present

## 2017-02-03 DIAGNOSIS — G35 Multiple sclerosis: Secondary | ICD-10-CM | POA: Diagnosis not present

## 2017-02-03 DIAGNOSIS — R1312 Dysphagia, oropharyngeal phase: Secondary | ICD-10-CM | POA: Diagnosis not present

## 2017-02-03 DIAGNOSIS — M6281 Muscle weakness (generalized): Secondary | ICD-10-CM | POA: Diagnosis not present

## 2017-02-03 DIAGNOSIS — J189 Pneumonia, unspecified organism: Secondary | ICD-10-CM | POA: Diagnosis not present

## 2017-02-04 ENCOUNTER — Other Ambulatory Visit: Payer: Self-pay

## 2017-02-04 ENCOUNTER — Ambulatory Visit (HOSPITAL_COMMUNITY)
Admission: RE | Admit: 2017-02-04 | Discharge: 2017-02-04 | Disposition: A | Discharge status: Home / Self Care | Payer: Medicare Other | Source: Ambulatory Visit | Attending: Internal Medicine | Admitting: Internal Medicine

## 2017-02-04 ENCOUNTER — Encounter (HOSPITAL_COMMUNITY): Payer: Self-pay | Admitting: Speech Pathology

## 2017-02-04 ENCOUNTER — Ambulatory Visit (HOSPITAL_COMMUNITY): Payer: Medicare Other | Attending: Internal Medicine | Admitting: Speech Pathology

## 2017-02-04 DIAGNOSIS — G8220 Paraplegia, unspecified: Secondary | ICD-10-CM | POA: Diagnosis not present

## 2017-02-04 DIAGNOSIS — R1310 Dysphagia, unspecified: Secondary | ICD-10-CM | POA: Diagnosis not present

## 2017-02-04 DIAGNOSIS — G35 Multiple sclerosis: Secondary | ICD-10-CM | POA: Diagnosis not present

## 2017-02-04 DIAGNOSIS — J189 Pneumonia, unspecified organism: Secondary | ICD-10-CM | POA: Diagnosis not present

## 2017-02-04 DIAGNOSIS — R1319 Other dysphagia: Secondary | ICD-10-CM | POA: Diagnosis present

## 2017-02-04 DIAGNOSIS — R293 Abnormal posture: Secondary | ICD-10-CM | POA: Diagnosis not present

## 2017-02-04 DIAGNOSIS — R1312 Dysphagia, oropharyngeal phase: Secondary | ICD-10-CM | POA: Insufficient documentation

## 2017-02-04 DIAGNOSIS — R2689 Other abnormalities of gait and mobility: Secondary | ICD-10-CM | POA: Diagnosis not present

## 2017-02-04 DIAGNOSIS — M6281 Muscle weakness (generalized): Secondary | ICD-10-CM | POA: Diagnosis not present

## 2017-02-04 NOTE — Therapy (Signed)
[place], [place], [address] Phone: [phone]   Fax:  [phone]  Modified Barium Swallow  Patient Details  Name: Mark Kramer MRN: [id number] Date of Birth: November 29, 1960 No Data Recorded  Encounter Date: 02/04/2017  End of Session - 02/04/17 2045    Visit Number  1    Number of Visits  1    Authorization Type  Medicare    SLP Start Time  6387    SLP Stop Time   1420    SLP Time Calculation (min)  40 min    Activity Tolerance  Patient tolerated treatment well       Past Medical History:  Diagnosis Date  . Dysphagia   . Grave's disease   . High cholesterol   . Hypothyroidism   . Multiple sclerosis ([place])   . Muscle weakness   . Pneumonia     Past Surgical History:  Procedure Laterality Date  . APPENDECTOMY    . BIOPSY  10/31/2016   Procedure: BIOPSY;  Surgeon: Danie Binder, MD;  [facility];  Service: Endoscopy;;  gastric   . ESOPHAGOGASTRODUODENOSCOPY N/A 10/31/2016   Procedure: ESOPHAGOGASTRODUODENOSCOPY (EGD);  Surgeon: Danie Binder, MD;  [facility];  Service: Endoscopy;  Laterality: N/A;  2:00pm  . IR GASTROSTOMY TUBE MOD SED  11/14/2016    There were no vitals filed for this visit.  Subjective Assessment - 02/04/17 2017    Subjective  "I didn't eat anything today."    Special Tests  MBSS    Currently in Pain?  No/denies          General - 02/04/17 2019      General Information   Date of Onset  11/14/16    HPI  Mark Kramer is a 57 yo male living with MS who was referred for MBSS due to dysphagia. Mark Kramer last had MBSS 10/07/2016 during acute stay with recommendation for D3 and thin. He eventually had a PEG placed 11/14/2016 due to progressive inability to take po. He is a resident of [facility] and referred by Dr. Caprice Renshaw for today's MBSS. No family present for today's evaluation.    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    Oral Care Completed by SLP  No    Oral Cavity - Dentition  Adequate natural dentition    Self-Feeding Abilities  Total assist    Patient Positioning  Upright in chair    Baseline Vocal Quality  Normal    Volitional Cough  Weak    Volitional Swallow  Able to elicit    Anatomy  Within functional limits    Pharyngeal Secretions  Not observed secondary MBS         Oral Preparation/Oral Phase - 02/04/17 2024      Oral Preparation/Oral Phase   Oral Phase  Impaired      Oral - Solids   Oral - Puree  Decreased bolus cohesion    Oral - Regular  Decreased  bolus cohesion      Electrical stimulation - Oral Phase   Was Electrical Stimulation Used  No       Pharyngeal Phase - 02/04/17 2034      Pharyngeal Phase   Pharyngeal Phase  Impaired      Pharyngeal - Nectar   Pharyngeal- Nectar Straw  Swallow initiation at vallecula;Reduced epiglottic inversion;Reduced airway/laryngeal closure;Penetration/Aspiration during swallow;Trace aspiration;Pharyngeal residue - pyriform;Pharyngeal residue - valleculae    Pharyngeal  Material enters airway, passes BELOW cords without attempt by patient to eject out (silent aspiration)      Pharyngeal - Thin   Pharyngeal- Thin Teaspoon  Swallow initiation at pyriform sinus;Penetration/Aspiration during swallow;Inter-arytenoid space residue    Pharyngeal  Material enters airway, CONTACTS cords and then ejected out    Pharyngeal- Thin Cup  Swallow initiation at pyriform sinus;Reduced epiglottic inversion;Pharyngeal residue - valleculae;Penetration/Aspiration during swallow     Pharyngeal  Material does not enter airway;Material enters airway, remains ABOVE vocal cords then ejected out    Pharyngeal- Thin Straw  Swallow initiation at vallecula;Reduced epiglottic inversion;Pharyngeal residue - valleculae      Pharyngeal - Solids   Pharyngeal- Puree  Swallow initiation at vallecula;Pharyngeal residue - valleculae    Pharyngeal- Regular  Delayed swallow initiation-vallecula    Pharyngeal- Pill  Swallow initiation at vallecula;Penetration/Aspiration during swallow    Pharyngeal  Material enters airway, remains ABOVE vocal cords then ejected out penetrated thins during the swallow      Electrical Stimulation - Pharyngeal Phase   Was Electrical Stimulation Used  No       Cricopharyngeal Phase - 02/04/17 2043      Cervical Esophageal Phase   Cervical Esophageal Phase  Within functional limits        Plan - 02/04/17 2046    Clinical Impression Statement  Pt presents with mild oropharyngeal phase dysphagia with similar presentation to MBSS completed on 10/07/2016 characterized by decreased bolus cohesiveness with solids and semi solids and variable mild premature spillage with liquids, swallow trigger at the level of the valleculae with straw sips and pyriforms with cup sips liquids,  variable reduced epiglottic deflection (at times it does deflect and sometimes does not) resulting in variable trace penetration during the swallow with thins. Pt with penetration to the cords x1 on tsp presentation of thins and underepiglottic coating at times with cup/straw presentations of thin and when taking barium tablet with thins. Interestingly, Pt with silent aspiration of NTL when taking straw sips which appeared to be due to difficulty coordinating respiration with swallow as Pt appeared to breathe immediately after first swallow and aspirated residuals not yet cleared in pharynx while taking sequential straw sips (epiglottis was not fully deflected at this point).  Pt appears safest  with thin liquids via straw sips (with head supported to reduce tremors) and ok for mechanical soft with upgrades to regular per treating SLP. Recommend po medications whole or crushed in puree. Pt to swallow 2x for each bite/sip to reduce pharyngeal residue. He is at risk for aspiration due to poor sensation once aspiration occurs and weak cough to clear. Pt was unable to clear the NTL once aspirated and along anterior tracheal wall. Pt demonstrated improved cognition and attention this date compared to [facility] in September. Risk for aspiration increases when cognition impaired.    Consulted and Agree with Plan of Care  Patient       Patient will benefit from skilled therapeutic intervention in order to improve the following deficits and impairments:  Oropharyngeal dysphagia    Recommendations/Treatment - 02/04/17 2044      Swallow Evaluation Recommendations   SLP Diet Recommendations  Thin;Dysphagia 3 (mechanical soft)    Liquid Administration via  Cup;Straw    Medication Administration  Whole meds with puree    Supervision  Full assist for feeding;Full supervision/cueing for compensatory strategies    Compensations  Minimize environmental distractions;Slow rate;Small sips/bites;Multiple dry swallows after each bite/sip    Postural Changes  Seated upright at 90 degrees;Remain upright for at least 30 minutes after feeds/meals       Prognosis - 02/04/17 2044      Prognosis   Prognosis for Safe Diet Advancement  Fair    Barriers to Reach Goals  Cognitive deficits      Individuals Consulted   Consulted and Agree with Results and Recommendations  Patient    Report Sent to   Referring physician;Primary SLP;Facility (Comment)       Problem List Patient Active Problem List   Diagnosis Date Noted  . Failure to thrive (child) 10/31/2016  . Pneumonia 10/31/2016  . Gastritis and gastroduodenitis   . Dysphagia 10/05/2016  . Sepsis due to pneumonia ([place]) 10/04/2016  . Acute metabolic  encephalopathy 21/22/4825  . Acute respiratory failure with hypoxia ([place]) 10/04/2016  . Pressure injury of skin 10/02/2016  . Decubitus ulcer of right buttock, stage 1 10/02/2016  . Tremor 07/26/2014  . Other fatigue 07/26/2014  . Urge incontinence 07/26/2014  . Cognitive dysfunction 07/26/2014  . Depression 07/26/2014  . Multiple sclerosis ([place]) 04/09/2012  . Graves disease 04/09/2012  . Dyslipidemia 04/09/2012  . Altered mental status 04/09/2012  . UTI (urinary tract infection) 04/09/2012   Thank you,  Genene Churn, [place]  [facility] 02/04/2017, 8:48 PM  [place] [address] [place], [place], [address] Phone: [phone]   Fax:  [phone]  Name: Mark Kramer MRN: [id number] Date of Birth: 1960/07/21

## 2017-02-05 DIAGNOSIS — R293 Abnormal posture: Secondary | ICD-10-CM | POA: Diagnosis not present

## 2017-02-05 DIAGNOSIS — R1312 Dysphagia, oropharyngeal phase: Secondary | ICD-10-CM | POA: Diagnosis not present

## 2017-02-05 DIAGNOSIS — J189 Pneumonia, unspecified organism: Secondary | ICD-10-CM | POA: Diagnosis not present

## 2017-02-05 DIAGNOSIS — G20 Parkinson's disease: Secondary | ICD-10-CM | POA: Diagnosis not present

## 2017-02-05 DIAGNOSIS — M6281 Muscle weakness (generalized): Secondary | ICD-10-CM | POA: Diagnosis not present

## 2017-02-05 DIAGNOSIS — F4323 Adjustment disorder with mixed anxiety and depressed mood: Secondary | ICD-10-CM | POA: Diagnosis not present

## 2017-02-05 DIAGNOSIS — G35 Multiple sclerosis: Secondary | ICD-10-CM | POA: Diagnosis not present

## 2017-02-05 DIAGNOSIS — F332 Major depressive disorder, recurrent severe without psychotic features: Secondary | ICD-10-CM | POA: Diagnosis not present

## 2017-02-05 DIAGNOSIS — F413 Other mixed anxiety disorders: Secondary | ICD-10-CM | POA: Diagnosis not present

## 2017-02-06 DIAGNOSIS — R1312 Dysphagia, oropharyngeal phase: Secondary | ICD-10-CM | POA: Diagnosis not present

## 2017-02-06 DIAGNOSIS — M6281 Muscle weakness (generalized): Secondary | ICD-10-CM | POA: Diagnosis not present

## 2017-02-06 DIAGNOSIS — R293 Abnormal posture: Secondary | ICD-10-CM | POA: Diagnosis not present

## 2017-02-06 DIAGNOSIS — G35 Multiple sclerosis: Secondary | ICD-10-CM | POA: Diagnosis not present

## 2017-02-06 DIAGNOSIS — J189 Pneumonia, unspecified organism: Secondary | ICD-10-CM | POA: Diagnosis not present

## 2017-02-08 DIAGNOSIS — R293 Abnormal posture: Secondary | ICD-10-CM | POA: Diagnosis not present

## 2017-02-08 DIAGNOSIS — R1312 Dysphagia, oropharyngeal phase: Secondary | ICD-10-CM | POA: Diagnosis not present

## 2017-02-08 DIAGNOSIS — J189 Pneumonia, unspecified organism: Secondary | ICD-10-CM | POA: Diagnosis not present

## 2017-02-08 DIAGNOSIS — M25552 Pain in left hip: Secondary | ICD-10-CM | POA: Diagnosis not present

## 2017-02-08 DIAGNOSIS — M6281 Muscle weakness (generalized): Secondary | ICD-10-CM | POA: Diagnosis not present

## 2017-02-08 DIAGNOSIS — G35 Multiple sclerosis: Secondary | ICD-10-CM | POA: Diagnosis not present

## 2017-02-08 DIAGNOSIS — M25551 Pain in right hip: Secondary | ICD-10-CM | POA: Diagnosis not present

## 2017-02-09 DIAGNOSIS — R1312 Dysphagia, oropharyngeal phase: Secondary | ICD-10-CM | POA: Diagnosis not present

## 2017-02-09 DIAGNOSIS — M6281 Muscle weakness (generalized): Secondary | ICD-10-CM | POA: Diagnosis not present

## 2017-02-09 DIAGNOSIS — G35 Multiple sclerosis: Secondary | ICD-10-CM | POA: Diagnosis not present

## 2017-02-09 DIAGNOSIS — J189 Pneumonia, unspecified organism: Secondary | ICD-10-CM | POA: Diagnosis not present

## 2017-02-09 DIAGNOSIS — R293 Abnormal posture: Secondary | ICD-10-CM | POA: Diagnosis not present

## 2017-02-11 DIAGNOSIS — J189 Pneumonia, unspecified organism: Secondary | ICD-10-CM | POA: Diagnosis not present

## 2017-02-11 DIAGNOSIS — G35 Multiple sclerosis: Secondary | ICD-10-CM | POA: Diagnosis not present

## 2017-02-11 DIAGNOSIS — R293 Abnormal posture: Secondary | ICD-10-CM | POA: Diagnosis not present

## 2017-02-11 DIAGNOSIS — M6281 Muscle weakness (generalized): Secondary | ICD-10-CM | POA: Diagnosis not present

## 2017-02-11 DIAGNOSIS — R1312 Dysphagia, oropharyngeal phase: Secondary | ICD-10-CM | POA: Diagnosis not present

## 2017-02-12 DIAGNOSIS — M6281 Muscle weakness (generalized): Secondary | ICD-10-CM | POA: Diagnosis not present

## 2017-02-12 DIAGNOSIS — G35 Multiple sclerosis: Secondary | ICD-10-CM | POA: Diagnosis not present

## 2017-02-12 DIAGNOSIS — M1611 Unilateral primary osteoarthritis, right hip: Secondary | ICD-10-CM | POA: Diagnosis not present

## 2017-02-12 DIAGNOSIS — R1312 Dysphagia, oropharyngeal phase: Secondary | ICD-10-CM | POA: Diagnosis not present

## 2017-02-12 DIAGNOSIS — R293 Abnormal posture: Secondary | ICD-10-CM | POA: Diagnosis not present

## 2017-02-12 DIAGNOSIS — J189 Pneumonia, unspecified organism: Secondary | ICD-10-CM | POA: Diagnosis not present

## 2017-02-13 DIAGNOSIS — R1312 Dysphagia, oropharyngeal phase: Secondary | ICD-10-CM | POA: Diagnosis not present

## 2017-02-13 DIAGNOSIS — R293 Abnormal posture: Secondary | ICD-10-CM | POA: Diagnosis not present

## 2017-02-13 DIAGNOSIS — M6281 Muscle weakness (generalized): Secondary | ICD-10-CM | POA: Diagnosis not present

## 2017-02-13 DIAGNOSIS — G35 Multiple sclerosis: Secondary | ICD-10-CM | POA: Diagnosis not present

## 2017-02-13 DIAGNOSIS — J189 Pneumonia, unspecified organism: Secondary | ICD-10-CM | POA: Diagnosis not present

## 2017-02-16 DIAGNOSIS — R293 Abnormal posture: Secondary | ICD-10-CM | POA: Diagnosis not present

## 2017-02-16 DIAGNOSIS — J189 Pneumonia, unspecified organism: Secondary | ICD-10-CM | POA: Diagnosis not present

## 2017-02-16 DIAGNOSIS — M6281 Muscle weakness (generalized): Secondary | ICD-10-CM | POA: Diagnosis not present

## 2017-02-16 DIAGNOSIS — R1312 Dysphagia, oropharyngeal phase: Secondary | ICD-10-CM | POA: Diagnosis not present

## 2017-02-16 DIAGNOSIS — G35 Multiple sclerosis: Secondary | ICD-10-CM | POA: Diagnosis not present

## 2017-02-17 DIAGNOSIS — J189 Pneumonia, unspecified organism: Secondary | ICD-10-CM | POA: Diagnosis not present

## 2017-02-17 DIAGNOSIS — G35 Multiple sclerosis: Secondary | ICD-10-CM | POA: Diagnosis not present

## 2017-02-17 DIAGNOSIS — R1312 Dysphagia, oropharyngeal phase: Secondary | ICD-10-CM | POA: Diagnosis not present

## 2017-02-17 DIAGNOSIS — R293 Abnormal posture: Secondary | ICD-10-CM | POA: Diagnosis not present

## 2017-02-17 DIAGNOSIS — M6281 Muscle weakness (generalized): Secondary | ICD-10-CM | POA: Diagnosis not present

## 2017-02-18 DIAGNOSIS — J189 Pneumonia, unspecified organism: Secondary | ICD-10-CM | POA: Diagnosis not present

## 2017-02-18 DIAGNOSIS — R293 Abnormal posture: Secondary | ICD-10-CM | POA: Diagnosis not present

## 2017-02-18 DIAGNOSIS — M6281 Muscle weakness (generalized): Secondary | ICD-10-CM | POA: Diagnosis not present

## 2017-02-18 DIAGNOSIS — R1312 Dysphagia, oropharyngeal phase: Secondary | ICD-10-CM | POA: Diagnosis not present

## 2017-02-18 DIAGNOSIS — G35 Multiple sclerosis: Secondary | ICD-10-CM | POA: Diagnosis not present

## 2017-02-19 DIAGNOSIS — F413 Other mixed anxiety disorders: Secondary | ICD-10-CM | POA: Diagnosis not present

## 2017-02-19 DIAGNOSIS — R293 Abnormal posture: Secondary | ICD-10-CM | POA: Diagnosis not present

## 2017-02-19 DIAGNOSIS — F4323 Adjustment disorder with mixed anxiety and depressed mood: Secondary | ICD-10-CM | POA: Diagnosis not present

## 2017-02-19 DIAGNOSIS — J189 Pneumonia, unspecified organism: Secondary | ICD-10-CM | POA: Diagnosis not present

## 2017-02-19 DIAGNOSIS — F332 Major depressive disorder, recurrent severe without psychotic features: Secondary | ICD-10-CM | POA: Diagnosis not present

## 2017-02-19 DIAGNOSIS — G35 Multiple sclerosis: Secondary | ICD-10-CM | POA: Diagnosis not present

## 2017-02-19 DIAGNOSIS — M6281 Muscle weakness (generalized): Secondary | ICD-10-CM | POA: Diagnosis not present

## 2017-02-19 DIAGNOSIS — R1312 Dysphagia, oropharyngeal phase: Secondary | ICD-10-CM | POA: Diagnosis not present

## 2017-02-19 DIAGNOSIS — G20 Parkinson's disease: Secondary | ICD-10-CM | POA: Diagnosis not present

## 2017-02-20 DIAGNOSIS — J189 Pneumonia, unspecified organism: Secondary | ICD-10-CM | POA: Diagnosis not present

## 2017-02-20 DIAGNOSIS — R1312 Dysphagia, oropharyngeal phase: Secondary | ICD-10-CM | POA: Diagnosis not present

## 2017-02-20 DIAGNOSIS — M6281 Muscle weakness (generalized): Secondary | ICD-10-CM | POA: Diagnosis not present

## 2017-02-20 DIAGNOSIS — G35 Multiple sclerosis: Secondary | ICD-10-CM | POA: Diagnosis not present

## 2017-02-20 DIAGNOSIS — R293 Abnormal posture: Secondary | ICD-10-CM | POA: Diagnosis not present

## 2017-02-23 DIAGNOSIS — M6281 Muscle weakness (generalized): Secondary | ICD-10-CM | POA: Diagnosis not present

## 2017-02-23 DIAGNOSIS — R293 Abnormal posture: Secondary | ICD-10-CM | POA: Diagnosis not present

## 2017-02-23 DIAGNOSIS — G35 Multiple sclerosis: Secondary | ICD-10-CM | POA: Diagnosis not present

## 2017-02-23 DIAGNOSIS — R1312 Dysphagia, oropharyngeal phase: Secondary | ICD-10-CM | POA: Diagnosis not present

## 2017-02-23 DIAGNOSIS — J189 Pneumonia, unspecified organism: Secondary | ICD-10-CM | POA: Diagnosis not present

## 2017-02-24 DIAGNOSIS — R1312 Dysphagia, oropharyngeal phase: Secondary | ICD-10-CM | POA: Diagnosis not present

## 2017-02-24 DIAGNOSIS — G35 Multiple sclerosis: Secondary | ICD-10-CM | POA: Diagnosis not present

## 2017-02-24 DIAGNOSIS — M6281 Muscle weakness (generalized): Secondary | ICD-10-CM | POA: Diagnosis not present

## 2017-02-24 DIAGNOSIS — J189 Pneumonia, unspecified organism: Secondary | ICD-10-CM | POA: Diagnosis not present

## 2017-02-24 DIAGNOSIS — R293 Abnormal posture: Secondary | ICD-10-CM | POA: Diagnosis not present

## 2017-02-25 DIAGNOSIS — M6281 Muscle weakness (generalized): Secondary | ICD-10-CM | POA: Diagnosis not present

## 2017-02-25 DIAGNOSIS — R1312 Dysphagia, oropharyngeal phase: Secondary | ICD-10-CM | POA: Diagnosis not present

## 2017-02-25 DIAGNOSIS — G35 Multiple sclerosis: Secondary | ICD-10-CM | POA: Diagnosis not present

## 2017-02-25 DIAGNOSIS — J189 Pneumonia, unspecified organism: Secondary | ICD-10-CM | POA: Diagnosis not present

## 2017-02-25 DIAGNOSIS — R293 Abnormal posture: Secondary | ICD-10-CM | POA: Diagnosis not present

## 2017-02-26 DIAGNOSIS — R293 Abnormal posture: Secondary | ICD-10-CM | POA: Diagnosis not present

## 2017-02-26 DIAGNOSIS — G35 Multiple sclerosis: Secondary | ICD-10-CM | POA: Diagnosis not present

## 2017-02-26 DIAGNOSIS — R1312 Dysphagia, oropharyngeal phase: Secondary | ICD-10-CM | POA: Diagnosis not present

## 2017-02-26 DIAGNOSIS — J189 Pneumonia, unspecified organism: Secondary | ICD-10-CM | POA: Diagnosis not present

## 2017-02-26 DIAGNOSIS — M6281 Muscle weakness (generalized): Secondary | ICD-10-CM | POA: Diagnosis not present

## 2017-02-28 DIAGNOSIS — R293 Abnormal posture: Secondary | ICD-10-CM | POA: Diagnosis not present

## 2017-02-28 DIAGNOSIS — G35 Multiple sclerosis: Secondary | ICD-10-CM | POA: Diagnosis not present

## 2017-02-28 DIAGNOSIS — M6281 Muscle weakness (generalized): Secondary | ICD-10-CM | POA: Diagnosis not present

## 2017-02-28 DIAGNOSIS — R1312 Dysphagia, oropharyngeal phase: Secondary | ICD-10-CM | POA: Diagnosis not present

## 2017-02-28 DIAGNOSIS — J189 Pneumonia, unspecified organism: Secondary | ICD-10-CM | POA: Diagnosis not present

## 2017-03-02 DIAGNOSIS — J189 Pneumonia, unspecified organism: Secondary | ICD-10-CM | POA: Diagnosis not present

## 2017-03-02 DIAGNOSIS — R293 Abnormal posture: Secondary | ICD-10-CM | POA: Diagnosis not present

## 2017-03-02 DIAGNOSIS — I10 Essential (primary) hypertension: Secondary | ICD-10-CM | POA: Diagnosis not present

## 2017-03-02 DIAGNOSIS — G35 Multiple sclerosis: Secondary | ICD-10-CM | POA: Diagnosis not present

## 2017-03-02 DIAGNOSIS — G250 Essential tremor: Secondary | ICD-10-CM | POA: Diagnosis not present

## 2017-03-02 DIAGNOSIS — R1312 Dysphagia, oropharyngeal phase: Secondary | ICD-10-CM | POA: Diagnosis not present

## 2017-03-02 DIAGNOSIS — M6281 Muscle weakness (generalized): Secondary | ICD-10-CM | POA: Diagnosis not present

## 2017-03-02 DIAGNOSIS — E785 Hyperlipidemia, unspecified: Secondary | ICD-10-CM | POA: Diagnosis not present

## 2017-03-03 DIAGNOSIS — M6281 Muscle weakness (generalized): Secondary | ICD-10-CM | POA: Diagnosis not present

## 2017-03-03 DIAGNOSIS — G35 Multiple sclerosis: Secondary | ICD-10-CM | POA: Diagnosis not present

## 2017-03-03 DIAGNOSIS — R1312 Dysphagia, oropharyngeal phase: Secondary | ICD-10-CM | POA: Diagnosis not present

## 2017-03-03 DIAGNOSIS — J189 Pneumonia, unspecified organism: Secondary | ICD-10-CM | POA: Diagnosis not present

## 2017-03-03 DIAGNOSIS — R293 Abnormal posture: Secondary | ICD-10-CM | POA: Diagnosis not present

## 2017-03-04 DIAGNOSIS — R293 Abnormal posture: Secondary | ICD-10-CM | POA: Diagnosis not present

## 2017-03-04 DIAGNOSIS — R1312 Dysphagia, oropharyngeal phase: Secondary | ICD-10-CM | POA: Diagnosis not present

## 2017-03-04 DIAGNOSIS — J189 Pneumonia, unspecified organism: Secondary | ICD-10-CM | POA: Diagnosis not present

## 2017-03-04 DIAGNOSIS — G35 Multiple sclerosis: Secondary | ICD-10-CM | POA: Diagnosis not present

## 2017-03-04 DIAGNOSIS — M6281 Muscle weakness (generalized): Secondary | ICD-10-CM | POA: Diagnosis not present

## 2017-03-05 DIAGNOSIS — R1312 Dysphagia, oropharyngeal phase: Secondary | ICD-10-CM | POA: Diagnosis not present

## 2017-03-05 DIAGNOSIS — J189 Pneumonia, unspecified organism: Secondary | ICD-10-CM | POA: Diagnosis not present

## 2017-03-05 DIAGNOSIS — G35 Multiple sclerosis: Secondary | ICD-10-CM | POA: Diagnosis not present

## 2017-03-05 DIAGNOSIS — M6281 Muscle weakness (generalized): Secondary | ICD-10-CM | POA: Diagnosis not present

## 2017-03-05 DIAGNOSIS — R293 Abnormal posture: Secondary | ICD-10-CM | POA: Diagnosis not present

## 2017-03-06 DIAGNOSIS — E785 Hyperlipidemia, unspecified: Secondary | ICD-10-CM | POA: Diagnosis not present

## 2017-03-06 DIAGNOSIS — E039 Hypothyroidism, unspecified: Secondary | ICD-10-CM | POA: Diagnosis not present

## 2017-03-06 DIAGNOSIS — J189 Pneumonia, unspecified organism: Secondary | ICD-10-CM | POA: Diagnosis not present

## 2017-03-06 DIAGNOSIS — Z79899 Other long term (current) drug therapy: Secondary | ICD-10-CM | POA: Diagnosis not present

## 2017-03-06 DIAGNOSIS — D649 Anemia, unspecified: Secondary | ICD-10-CM | POA: Diagnosis not present

## 2017-03-06 DIAGNOSIS — I10 Essential (primary) hypertension: Secondary | ICD-10-CM | POA: Diagnosis not present

## 2017-03-06 DIAGNOSIS — R1312 Dysphagia, oropharyngeal phase: Secondary | ICD-10-CM | POA: Diagnosis not present

## 2017-03-06 DIAGNOSIS — M6281 Muscle weakness (generalized): Secondary | ICD-10-CM | POA: Diagnosis not present

## 2017-03-06 DIAGNOSIS — E559 Vitamin D deficiency, unspecified: Secondary | ICD-10-CM | POA: Diagnosis not present

## 2017-03-06 DIAGNOSIS — G35 Multiple sclerosis: Secondary | ICD-10-CM | POA: Diagnosis not present

## 2017-03-06 DIAGNOSIS — R293 Abnormal posture: Secondary | ICD-10-CM | POA: Diagnosis not present

## 2017-03-18 DIAGNOSIS — F413 Other mixed anxiety disorders: Secondary | ICD-10-CM | POA: Diagnosis not present

## 2017-03-18 DIAGNOSIS — G20 Parkinson's disease: Secondary | ICD-10-CM | POA: Diagnosis not present

## 2017-03-18 DIAGNOSIS — F419 Anxiety disorder, unspecified: Secondary | ICD-10-CM | POA: Diagnosis not present

## 2017-03-18 DIAGNOSIS — F4323 Adjustment disorder with mixed anxiety and depressed mood: Secondary | ICD-10-CM | POA: Diagnosis not present

## 2017-03-18 DIAGNOSIS — F332 Major depressive disorder, recurrent severe without psychotic features: Secondary | ICD-10-CM | POA: Diagnosis not present

## 2017-03-20 DIAGNOSIS — F411 Generalized anxiety disorder: Secondary | ICD-10-CM | POA: Diagnosis not present

## 2017-03-20 DIAGNOSIS — F321 Major depressive disorder, single episode, moderate: Secondary | ICD-10-CM | POA: Diagnosis not present

## 2017-03-25 DIAGNOSIS — G20 Parkinson's disease: Secondary | ICD-10-CM | POA: Diagnosis not present

## 2017-03-25 DIAGNOSIS — F4323 Adjustment disorder with mixed anxiety and depressed mood: Secondary | ICD-10-CM | POA: Diagnosis not present

## 2017-03-25 DIAGNOSIS — F413 Other mixed anxiety disorders: Secondary | ICD-10-CM | POA: Diagnosis not present

## 2017-03-25 DIAGNOSIS — F332 Major depressive disorder, recurrent severe without psychotic features: Secondary | ICD-10-CM | POA: Diagnosis not present

## 2017-04-01 DIAGNOSIS — G20 Parkinson's disease: Secondary | ICD-10-CM | POA: Diagnosis not present

## 2017-04-01 DIAGNOSIS — F4323 Adjustment disorder with mixed anxiety and depressed mood: Secondary | ICD-10-CM | POA: Diagnosis not present

## 2017-04-01 DIAGNOSIS — F332 Major depressive disorder, recurrent severe without psychotic features: Secondary | ICD-10-CM | POA: Diagnosis not present

## 2017-04-01 DIAGNOSIS — F413 Other mixed anxiety disorders: Secondary | ICD-10-CM | POA: Diagnosis not present

## 2017-04-07 DIAGNOSIS — Z79899 Other long term (current) drug therapy: Secondary | ICD-10-CM | POA: Diagnosis not present

## 2017-04-07 DIAGNOSIS — G9349 Other encephalopathy: Secondary | ICD-10-CM | POA: Diagnosis not present

## 2017-04-07 DIAGNOSIS — G35 Multiple sclerosis: Secondary | ICD-10-CM | POA: Diagnosis not present

## 2017-04-07 DIAGNOSIS — G252 Other specified forms of tremor: Secondary | ICD-10-CM | POA: Diagnosis not present

## 2017-04-28 DIAGNOSIS — I10 Essential (primary) hypertension: Secondary | ICD-10-CM | POA: Diagnosis not present

## 2017-04-28 DIAGNOSIS — G35 Multiple sclerosis: Secondary | ICD-10-CM | POA: Diagnosis not present

## 2017-04-28 DIAGNOSIS — F329 Major depressive disorder, single episode, unspecified: Secondary | ICD-10-CM | POA: Diagnosis not present

## 2017-04-28 DIAGNOSIS — G250 Essential tremor: Secondary | ICD-10-CM | POA: Diagnosis not present

## 2017-06-10 DIAGNOSIS — F4323 Adjustment disorder with mixed anxiety and depressed mood: Secondary | ICD-10-CM | POA: Diagnosis not present

## 2017-06-10 DIAGNOSIS — F413 Other mixed anxiety disorders: Secondary | ICD-10-CM | POA: Diagnosis not present

## 2017-06-10 DIAGNOSIS — G20 Parkinson's disease: Secondary | ICD-10-CM | POA: Diagnosis not present

## 2017-06-10 DIAGNOSIS — F332 Major depressive disorder, recurrent severe without psychotic features: Secondary | ICD-10-CM | POA: Diagnosis not present

## 2017-06-12 DIAGNOSIS — F411 Generalized anxiety disorder: Secondary | ICD-10-CM | POA: Diagnosis not present

## 2017-06-12 DIAGNOSIS — F321 Major depressive disorder, single episode, moderate: Secondary | ICD-10-CM | POA: Diagnosis not present

## 2017-07-14 DIAGNOSIS — I10 Essential (primary) hypertension: Secondary | ICD-10-CM | POA: Diagnosis not present

## 2017-07-14 DIAGNOSIS — G35 Multiple sclerosis: Secondary | ICD-10-CM | POA: Diagnosis not present

## 2017-07-14 DIAGNOSIS — F329 Major depressive disorder, single episode, unspecified: Secondary | ICD-10-CM | POA: Diagnosis not present

## 2017-08-21 DIAGNOSIS — F321 Major depressive disorder, single episode, moderate: Secondary | ICD-10-CM | POA: Diagnosis not present

## 2017-08-21 DIAGNOSIS — F411 Generalized anxiety disorder: Secondary | ICD-10-CM | POA: Diagnosis not present

## 2017-09-16 DIAGNOSIS — B079 Viral wart, unspecified: Secondary | ICD-10-CM | POA: Diagnosis not present

## 2017-09-16 DIAGNOSIS — D485 Neoplasm of uncertain behavior of skin: Secondary | ICD-10-CM | POA: Diagnosis not present

## 2017-09-16 DIAGNOSIS — L819 Disorder of pigmentation, unspecified: Secondary | ICD-10-CM | POA: Diagnosis not present

## 2017-09-16 DIAGNOSIS — L570 Actinic keratosis: Secondary | ICD-10-CM | POA: Diagnosis not present

## 2017-09-29 DIAGNOSIS — G35 Multiple sclerosis: Secondary | ICD-10-CM | POA: Diagnosis not present

## 2017-09-29 DIAGNOSIS — F329 Major depressive disorder, single episode, unspecified: Secondary | ICD-10-CM | POA: Diagnosis not present

## 2017-09-29 DIAGNOSIS — I10 Essential (primary) hypertension: Secondary | ICD-10-CM | POA: Diagnosis not present

## 2017-09-29 DIAGNOSIS — F419 Anxiety disorder, unspecified: Secondary | ICD-10-CM | POA: Diagnosis not present

## 2017-09-30 DIAGNOSIS — Z79899 Other long term (current) drug therapy: Secondary | ICD-10-CM | POA: Diagnosis not present

## 2017-09-30 DIAGNOSIS — G252 Other specified forms of tremor: Secondary | ICD-10-CM | POA: Diagnosis not present

## 2017-09-30 DIAGNOSIS — G9349 Other encephalopathy: Secondary | ICD-10-CM | POA: Diagnosis not present

## 2017-09-30 DIAGNOSIS — G35 Multiple sclerosis: Secondary | ICD-10-CM | POA: Diagnosis not present

## 2017-10-17 DIAGNOSIS — F411 Generalized anxiety disorder: Secondary | ICD-10-CM | POA: Diagnosis not present

## 2017-10-17 DIAGNOSIS — F321 Major depressive disorder, single episode, moderate: Secondary | ICD-10-CM | POA: Diagnosis not present

## 2017-11-05 DIAGNOSIS — H25813 Combined forms of age-related cataract, bilateral: Secondary | ICD-10-CM | POA: Diagnosis not present

## 2017-11-05 DIAGNOSIS — Z79899 Other long term (current) drug therapy: Secondary | ICD-10-CM | POA: Diagnosis not present

## 2017-11-05 DIAGNOSIS — H5213 Myopia, bilateral: Secondary | ICD-10-CM | POA: Diagnosis not present

## 2017-11-19 DIAGNOSIS — E039 Hypothyroidism, unspecified: Secondary | ICD-10-CM | POA: Diagnosis not present

## 2017-11-19 DIAGNOSIS — E785 Hyperlipidemia, unspecified: Secondary | ICD-10-CM | POA: Diagnosis not present

## 2017-11-19 DIAGNOSIS — G35 Multiple sclerosis: Secondary | ICD-10-CM | POA: Diagnosis not present

## 2017-11-19 DIAGNOSIS — I10 Essential (primary) hypertension: Secondary | ICD-10-CM | POA: Diagnosis not present

## 2017-11-28 DIAGNOSIS — I10 Essential (primary) hypertension: Secondary | ICD-10-CM | POA: Diagnosis not present

## 2017-11-28 DIAGNOSIS — E039 Hypothyroidism, unspecified: Secondary | ICD-10-CM | POA: Diagnosis not present

## 2017-11-28 DIAGNOSIS — D649 Anemia, unspecified: Secondary | ICD-10-CM | POA: Diagnosis not present

## 2017-11-28 DIAGNOSIS — E785 Hyperlipidemia, unspecified: Secondary | ICD-10-CM | POA: Diagnosis not present

## 2017-11-28 DIAGNOSIS — Z5181 Encounter for therapeutic drug level monitoring: Secondary | ICD-10-CM | POA: Diagnosis not present

## 2017-11-28 DIAGNOSIS — E119 Type 2 diabetes mellitus without complications: Secondary | ICD-10-CM | POA: Diagnosis not present

## 2017-12-07 DIAGNOSIS — E039 Hypothyroidism, unspecified: Secondary | ICD-10-CM | POA: Diagnosis not present

## 2017-12-07 DIAGNOSIS — I10 Essential (primary) hypertension: Secondary | ICD-10-CM | POA: Diagnosis not present

## 2017-12-07 DIAGNOSIS — E785 Hyperlipidemia, unspecified: Secondary | ICD-10-CM | POA: Diagnosis not present

## 2017-12-15 DIAGNOSIS — G35 Multiple sclerosis: Secondary | ICD-10-CM | POA: Diagnosis not present

## 2017-12-15 DIAGNOSIS — I10 Essential (primary) hypertension: Secondary | ICD-10-CM | POA: Diagnosis not present

## 2017-12-15 DIAGNOSIS — H25813 Combined forms of age-related cataract, bilateral: Secondary | ICD-10-CM | POA: Diagnosis not present

## 2017-12-15 DIAGNOSIS — R7989 Other specified abnormal findings of blood chemistry: Secondary | ICD-10-CM | POA: Diagnosis not present

## 2017-12-15 DIAGNOSIS — D649 Anemia, unspecified: Secondary | ICD-10-CM | POA: Diagnosis not present

## 2017-12-17 DIAGNOSIS — D72819 Decreased white blood cell count, unspecified: Secondary | ICD-10-CM | POA: Diagnosis not present

## 2017-12-26 DIAGNOSIS — R41841 Cognitive communication deficit: Secondary | ICD-10-CM | POA: Diagnosis not present

## 2017-12-26 DIAGNOSIS — E785 Hyperlipidemia, unspecified: Secondary | ICD-10-CM | POA: Diagnosis not present

## 2017-12-27 DIAGNOSIS — E785 Hyperlipidemia, unspecified: Secondary | ICD-10-CM | POA: Diagnosis not present

## 2017-12-27 DIAGNOSIS — R41841 Cognitive communication deficit: Secondary | ICD-10-CM | POA: Diagnosis not present

## 2017-12-28 DIAGNOSIS — R41841 Cognitive communication deficit: Secondary | ICD-10-CM | POA: Diagnosis not present

## 2017-12-28 DIAGNOSIS — E785 Hyperlipidemia, unspecified: Secondary | ICD-10-CM | POA: Diagnosis not present

## 2017-12-29 DIAGNOSIS — E785 Hyperlipidemia, unspecified: Secondary | ICD-10-CM | POA: Diagnosis not present

## 2017-12-29 DIAGNOSIS — R41841 Cognitive communication deficit: Secondary | ICD-10-CM | POA: Diagnosis not present

## 2017-12-31 DIAGNOSIS — E785 Hyperlipidemia, unspecified: Secondary | ICD-10-CM | POA: Diagnosis not present

## 2017-12-31 DIAGNOSIS — R41841 Cognitive communication deficit: Secondary | ICD-10-CM | POA: Diagnosis not present

## 2018-01-02 DIAGNOSIS — E785 Hyperlipidemia, unspecified: Secondary | ICD-10-CM | POA: Diagnosis not present

## 2018-01-02 DIAGNOSIS — R41841 Cognitive communication deficit: Secondary | ICD-10-CM | POA: Diagnosis not present

## 2018-01-03 DIAGNOSIS — E785 Hyperlipidemia, unspecified: Secondary | ICD-10-CM | POA: Diagnosis not present

## 2018-01-03 DIAGNOSIS — R41841 Cognitive communication deficit: Secondary | ICD-10-CM | POA: Diagnosis not present

## 2018-01-04 DIAGNOSIS — E785 Hyperlipidemia, unspecified: Secondary | ICD-10-CM | POA: Diagnosis not present

## 2018-01-04 DIAGNOSIS — R41841 Cognitive communication deficit: Secondary | ICD-10-CM | POA: Diagnosis not present

## 2018-01-05 DIAGNOSIS — R41841 Cognitive communication deficit: Secondary | ICD-10-CM | POA: Diagnosis not present

## 2018-01-05 DIAGNOSIS — E785 Hyperlipidemia, unspecified: Secondary | ICD-10-CM | POA: Diagnosis not present

## 2018-01-08 DIAGNOSIS — E785 Hyperlipidemia, unspecified: Secondary | ICD-10-CM | POA: Diagnosis not present

## 2018-01-08 DIAGNOSIS — R41841 Cognitive communication deficit: Secondary | ICD-10-CM | POA: Diagnosis not present

## 2018-01-09 DIAGNOSIS — E785 Hyperlipidemia, unspecified: Secondary | ICD-10-CM | POA: Diagnosis not present

## 2018-01-09 DIAGNOSIS — R41841 Cognitive communication deficit: Secondary | ICD-10-CM | POA: Diagnosis not present

## 2018-01-10 DIAGNOSIS — R41841 Cognitive communication deficit: Secondary | ICD-10-CM | POA: Diagnosis not present

## 2018-01-10 DIAGNOSIS — E785 Hyperlipidemia, unspecified: Secondary | ICD-10-CM | POA: Diagnosis not present

## 2018-01-11 DIAGNOSIS — E785 Hyperlipidemia, unspecified: Secondary | ICD-10-CM | POA: Diagnosis not present

## 2018-01-11 DIAGNOSIS — R41841 Cognitive communication deficit: Secondary | ICD-10-CM | POA: Diagnosis not present

## 2018-01-12 DIAGNOSIS — E785 Hyperlipidemia, unspecified: Secondary | ICD-10-CM | POA: Diagnosis not present

## 2018-01-12 DIAGNOSIS — R41841 Cognitive communication deficit: Secondary | ICD-10-CM | POA: Diagnosis not present

## 2018-01-13 DIAGNOSIS — G35 Multiple sclerosis: Secondary | ICD-10-CM | POA: Diagnosis not present

## 2018-01-13 DIAGNOSIS — E785 Hyperlipidemia, unspecified: Secondary | ICD-10-CM | POA: Diagnosis not present

## 2018-01-13 DIAGNOSIS — R41841 Cognitive communication deficit: Secondary | ICD-10-CM | POA: Diagnosis not present

## 2018-01-14 DIAGNOSIS — Z5181 Encounter for therapeutic drug level monitoring: Secondary | ICD-10-CM | POA: Diagnosis not present

## 2018-01-14 DIAGNOSIS — I10 Essential (primary) hypertension: Secondary | ICD-10-CM | POA: Diagnosis not present

## 2018-01-14 DIAGNOSIS — E785 Hyperlipidemia, unspecified: Secondary | ICD-10-CM | POA: Diagnosis not present

## 2018-01-14 DIAGNOSIS — Z79899 Other long term (current) drug therapy: Secondary | ICD-10-CM | POA: Diagnosis not present

## 2018-01-18 DIAGNOSIS — E785 Hyperlipidemia, unspecified: Secondary | ICD-10-CM | POA: Diagnosis not present

## 2018-01-18 DIAGNOSIS — R41841 Cognitive communication deficit: Secondary | ICD-10-CM | POA: Diagnosis not present

## 2018-01-18 DIAGNOSIS — G35 Multiple sclerosis: Secondary | ICD-10-CM | POA: Diagnosis not present

## 2018-01-19 DIAGNOSIS — E785 Hyperlipidemia, unspecified: Secondary | ICD-10-CM | POA: Diagnosis not present

## 2018-01-19 DIAGNOSIS — G35 Multiple sclerosis: Secondary | ICD-10-CM | POA: Diagnosis not present

## 2018-01-19 DIAGNOSIS — E039 Hypothyroidism, unspecified: Secondary | ICD-10-CM | POA: Diagnosis not present

## 2018-01-19 DIAGNOSIS — D72819 Decreased white blood cell count, unspecified: Secondary | ICD-10-CM | POA: Diagnosis not present

## 2018-01-19 DIAGNOSIS — R41841 Cognitive communication deficit: Secondary | ICD-10-CM | POA: Diagnosis not present

## 2018-01-20 DIAGNOSIS — F329 Major depressive disorder, single episode, unspecified: Secondary | ICD-10-CM | POA: Diagnosis not present

## 2018-01-20 DIAGNOSIS — E785 Hyperlipidemia, unspecified: Secondary | ICD-10-CM | POA: Diagnosis not present

## 2018-01-20 DIAGNOSIS — F419 Anxiety disorder, unspecified: Secondary | ICD-10-CM | POA: Diagnosis not present

## 2018-01-20 DIAGNOSIS — G35 Multiple sclerosis: Secondary | ICD-10-CM | POA: Diagnosis not present

## 2018-01-20 DIAGNOSIS — I10 Essential (primary) hypertension: Secondary | ICD-10-CM | POA: Diagnosis not present

## 2018-01-20 DIAGNOSIS — R41841 Cognitive communication deficit: Secondary | ICD-10-CM | POA: Diagnosis not present

## 2018-01-23 DIAGNOSIS — F321 Major depressive disorder, single episode, moderate: Secondary | ICD-10-CM | POA: Diagnosis not present

## 2018-01-23 DIAGNOSIS — F411 Generalized anxiety disorder: Secondary | ICD-10-CM | POA: Diagnosis not present

## 2018-03-04 DIAGNOSIS — Z931 Gastrostomy status: Secondary | ICD-10-CM | POA: Diagnosis not present

## 2018-03-04 DIAGNOSIS — R1110 Vomiting, unspecified: Secondary | ICD-10-CM | POA: Diagnosis not present

## 2018-03-04 DIAGNOSIS — R1310 Dysphagia, unspecified: Secondary | ICD-10-CM | POA: Diagnosis not present

## 2018-03-04 DIAGNOSIS — R141 Gas pain: Secondary | ICD-10-CM | POA: Diagnosis not present

## 2018-03-11 DIAGNOSIS — E039 Hypothyroidism, unspecified: Secondary | ICD-10-CM | POA: Diagnosis not present

## 2018-03-11 DIAGNOSIS — I10 Essential (primary) hypertension: Secondary | ICD-10-CM | POA: Diagnosis not present

## 2018-03-11 DIAGNOSIS — G35 Multiple sclerosis: Secondary | ICD-10-CM | POA: Diagnosis not present

## 2018-03-11 DIAGNOSIS — E785 Hyperlipidemia, unspecified: Secondary | ICD-10-CM | POA: Diagnosis not present

## 2018-03-24 DIAGNOSIS — G252 Other specified forms of tremor: Secondary | ICD-10-CM | POA: Diagnosis not present

## 2018-03-24 DIAGNOSIS — G9349 Other encephalopathy: Secondary | ICD-10-CM | POA: Diagnosis not present

## 2018-03-24 DIAGNOSIS — G35 Multiple sclerosis: Secondary | ICD-10-CM | POA: Diagnosis not present

## 2018-03-24 DIAGNOSIS — Z79899 Other long term (current) drug therapy: Secondary | ICD-10-CM | POA: Diagnosis not present

## 2018-03-26 DIAGNOSIS — F321 Major depressive disorder, single episode, moderate: Secondary | ICD-10-CM | POA: Diagnosis not present

## 2018-03-26 DIAGNOSIS — F411 Generalized anxiety disorder: Secondary | ICD-10-CM | POA: Diagnosis not present

## 2018-04-21 DIAGNOSIS — G35 Multiple sclerosis: Secondary | ICD-10-CM | POA: Diagnosis not present

## 2018-04-21 DIAGNOSIS — E785 Hyperlipidemia, unspecified: Secondary | ICD-10-CM | POA: Diagnosis not present

## 2018-04-21 DIAGNOSIS — E039 Hypothyroidism, unspecified: Secondary | ICD-10-CM | POA: Diagnosis not present

## 2018-05-07 DIAGNOSIS — F321 Major depressive disorder, single episode, moderate: Secondary | ICD-10-CM | POA: Diagnosis not present

## 2018-05-07 DIAGNOSIS — F411 Generalized anxiety disorder: Secondary | ICD-10-CM | POA: Diagnosis not present

## 2018-06-03 DIAGNOSIS — L989 Disorder of the skin and subcutaneous tissue, unspecified: Secondary | ICD-10-CM | POA: Diagnosis not present

## 2018-06-04 DIAGNOSIS — F411 Generalized anxiety disorder: Secondary | ICD-10-CM | POA: Diagnosis not present

## 2018-06-04 DIAGNOSIS — F321 Major depressive disorder, single episode, moderate: Secondary | ICD-10-CM | POA: Diagnosis not present

## 2018-07-02 DIAGNOSIS — E782 Mixed hyperlipidemia: Secondary | ICD-10-CM | POA: Diagnosis not present

## 2018-07-02 DIAGNOSIS — D649 Anemia, unspecified: Secondary | ICD-10-CM | POA: Diagnosis not present

## 2018-07-02 DIAGNOSIS — E559 Vitamin D deficiency, unspecified: Secondary | ICD-10-CM | POA: Diagnosis not present

## 2018-07-02 DIAGNOSIS — Z5181 Encounter for therapeutic drug level monitoring: Secondary | ICD-10-CM | POA: Diagnosis not present

## 2018-07-02 DIAGNOSIS — E785 Hyperlipidemia, unspecified: Secondary | ICD-10-CM | POA: Diagnosis not present

## 2018-07-02 DIAGNOSIS — E039 Hypothyroidism, unspecified: Secondary | ICD-10-CM | POA: Diagnosis not present

## 2018-07-02 DIAGNOSIS — N184 Chronic kidney disease, stage 4 (severe): Secondary | ICD-10-CM | POA: Diagnosis not present

## 2018-07-02 DIAGNOSIS — Z79899 Other long term (current) drug therapy: Secondary | ICD-10-CM | POA: Diagnosis not present

## 2018-07-05 DIAGNOSIS — I10 Essential (primary) hypertension: Secondary | ICD-10-CM | POA: Diagnosis not present

## 2018-07-05 DIAGNOSIS — E785 Hyperlipidemia, unspecified: Secondary | ICD-10-CM | POA: Diagnosis not present

## 2018-07-05 DIAGNOSIS — D72819 Decreased white blood cell count, unspecified: Secondary | ICD-10-CM | POA: Diagnosis not present

## 2018-07-05 DIAGNOSIS — E039 Hypothyroidism, unspecified: Secondary | ICD-10-CM | POA: Diagnosis not present

## 2018-07-08 DIAGNOSIS — E875 Hyperkalemia: Secondary | ICD-10-CM | POA: Diagnosis not present

## 2018-07-08 DIAGNOSIS — E876 Hypokalemia: Secondary | ICD-10-CM | POA: Diagnosis not present

## 2018-07-09 DIAGNOSIS — F411 Generalized anxiety disorder: Secondary | ICD-10-CM | POA: Diagnosis not present

## 2018-07-09 DIAGNOSIS — F321 Major depressive disorder, single episode, moderate: Secondary | ICD-10-CM | POA: Diagnosis not present

## 2018-07-19 ENCOUNTER — Other Ambulatory Visit: Payer: Self-pay | Admitting: Neurology

## 2018-07-19 ENCOUNTER — Other Ambulatory Visit (HOSPITAL_COMMUNITY): Payer: Self-pay | Admitting: Neurology

## 2018-07-19 DIAGNOSIS — G35 Multiple sclerosis: Secondary | ICD-10-CM

## 2018-07-23 ENCOUNTER — Ambulatory Visit (HOSPITAL_COMMUNITY)
Admission: RE | Admit: 2018-07-23 | Discharge: 2018-07-23 | Disposition: A | Discharge status: Home / Self Care | Payer: Medicare Other | Source: Ambulatory Visit | Attending: Neurology | Admitting: Neurology

## 2018-07-23 ENCOUNTER — Other Ambulatory Visit: Payer: Self-pay

## 2018-07-23 DIAGNOSIS — Z8669 Personal history of other diseases of the nervous system and sense organs: Secondary | ICD-10-CM | POA: Diagnosis not present

## 2018-07-23 DIAGNOSIS — G35 Multiple sclerosis: Secondary | ICD-10-CM

## 2018-07-23 DIAGNOSIS — R262 Difficulty in walking, not elsewhere classified: Secondary | ICD-10-CM | POA: Diagnosis not present

## 2018-07-23 DIAGNOSIS — M4802 Spinal stenosis, cervical region: Secondary | ICD-10-CM | POA: Diagnosis not present

## 2018-07-23 DIAGNOSIS — M47812 Spondylosis without myelopathy or radiculopathy, cervical region: Secondary | ICD-10-CM | POA: Diagnosis not present

## 2018-07-23 DIAGNOSIS — M50223 Other cervical disc displacement at C6-C7 level: Secondary | ICD-10-CM | POA: Diagnosis not present

## 2018-07-23 MED ORDER — GADOBUTROL 1 MMOL/ML IV SOLN
4.0000 mL | Freq: Once | INTRAVENOUS | Status: AC | PRN
  Administered 2018-07-23: 4 mL via INTRAVENOUS

## 2018-07-27 DIAGNOSIS — G35 Multiple sclerosis: Secondary | ICD-10-CM | POA: Diagnosis not present

## 2018-07-27 DIAGNOSIS — Z79899 Other long term (current) drug therapy: Secondary | ICD-10-CM | POA: Diagnosis not present

## 2018-07-27 DIAGNOSIS — G9349 Other encephalopathy: Secondary | ICD-10-CM | POA: Diagnosis not present

## 2018-07-27 DIAGNOSIS — G252 Other specified forms of tremor: Secondary | ICD-10-CM | POA: Diagnosis not present

## 2018-08-02 DIAGNOSIS — H25813 Combined forms of age-related cataract, bilateral: Secondary | ICD-10-CM | POA: Diagnosis not present

## 2018-08-06 DIAGNOSIS — F321 Major depressive disorder, single episode, moderate: Secondary | ICD-10-CM | POA: Diagnosis not present

## 2018-08-06 DIAGNOSIS — F411 Generalized anxiety disorder: Secondary | ICD-10-CM | POA: Diagnosis not present

## 2018-09-01 DIAGNOSIS — E785 Hyperlipidemia, unspecified: Secondary | ICD-10-CM | POA: Diagnosis not present

## 2018-09-01 DIAGNOSIS — E039 Hypothyroidism, unspecified: Secondary | ICD-10-CM | POA: Diagnosis not present

## 2018-09-01 DIAGNOSIS — G35 Multiple sclerosis: Secondary | ICD-10-CM | POA: Diagnosis not present

## 2018-09-03 DIAGNOSIS — F321 Major depressive disorder, single episode, moderate: Secondary | ICD-10-CM | POA: Diagnosis not present

## 2018-09-03 DIAGNOSIS — F411 Generalized anxiety disorder: Secondary | ICD-10-CM | POA: Diagnosis not present

## 2018-09-28 DIAGNOSIS — Z20828 Contact with and (suspected) exposure to other viral communicable diseases: Secondary | ICD-10-CM | POA: Diagnosis not present

## 2018-10-04 DIAGNOSIS — Z931 Gastrostomy status: Secondary | ICD-10-CM | POA: Diagnosis not present

## 2018-10-04 DIAGNOSIS — R1310 Dysphagia, unspecified: Secondary | ICD-10-CM | POA: Diagnosis not present

## 2018-10-04 DIAGNOSIS — Z20828 Contact with and (suspected) exposure to other viral communicable diseases: Secondary | ICD-10-CM | POA: Diagnosis not present

## 2018-10-11 DIAGNOSIS — Z20828 Contact with and (suspected) exposure to other viral communicable diseases: Secondary | ICD-10-CM | POA: Diagnosis not present

## 2018-10-19 DIAGNOSIS — Z20828 Contact with and (suspected) exposure to other viral communicable diseases: Secondary | ICD-10-CM | POA: Diagnosis not present

## 2018-10-21 DIAGNOSIS — E039 Hypothyroidism, unspecified: Secondary | ICD-10-CM | POA: Diagnosis not present

## 2018-10-21 DIAGNOSIS — G35 Multiple sclerosis: Secondary | ICD-10-CM | POA: Diagnosis not present

## 2018-10-21 DIAGNOSIS — I10 Essential (primary) hypertension: Secondary | ICD-10-CM | POA: Diagnosis not present

## 2018-10-21 DIAGNOSIS — G250 Essential tremor: Secondary | ICD-10-CM | POA: Diagnosis not present

## 2018-10-22 DIAGNOSIS — Z20828 Contact with and (suspected) exposure to other viral communicable diseases: Secondary | ICD-10-CM | POA: Diagnosis not present

## 2018-10-27 DIAGNOSIS — Z20828 Contact with and (suspected) exposure to other viral communicable diseases: Secondary | ICD-10-CM | POA: Diagnosis not present

## 2018-11-01 DIAGNOSIS — E785 Hyperlipidemia, unspecified: Secondary | ICD-10-CM | POA: Diagnosis not present

## 2018-11-01 DIAGNOSIS — K2990 Gastroduodenitis, unspecified, without bleeding: Secondary | ICD-10-CM | POA: Diagnosis not present

## 2018-11-01 DIAGNOSIS — E039 Hypothyroidism, unspecified: Secondary | ICD-10-CM | POA: Diagnosis not present

## 2018-11-01 DIAGNOSIS — D72819 Decreased white blood cell count, unspecified: Secondary | ICD-10-CM | POA: Diagnosis not present

## 2018-11-02 DIAGNOSIS — D649 Anemia, unspecified: Secondary | ICD-10-CM | POA: Diagnosis not present

## 2018-11-02 DIAGNOSIS — E785 Hyperlipidemia, unspecified: Secondary | ICD-10-CM | POA: Diagnosis not present

## 2018-11-02 DIAGNOSIS — E039 Hypothyroidism, unspecified: Secondary | ICD-10-CM | POA: Diagnosis not present

## 2018-11-02 DIAGNOSIS — E782 Mixed hyperlipidemia: Secondary | ICD-10-CM | POA: Diagnosis not present

## 2018-11-02 DIAGNOSIS — N184 Chronic kidney disease, stage 4 (severe): Secondary | ICD-10-CM | POA: Diagnosis not present

## 2018-11-02 DIAGNOSIS — E119 Type 2 diabetes mellitus without complications: Secondary | ICD-10-CM | POA: Diagnosis not present

## 2018-11-02 DIAGNOSIS — E559 Vitamin D deficiency, unspecified: Secondary | ICD-10-CM | POA: Diagnosis not present

## 2018-11-02 DIAGNOSIS — Z79899 Other long term (current) drug therapy: Secondary | ICD-10-CM | POA: Diagnosis not present

## 2018-11-04 DIAGNOSIS — Z20828 Contact with and (suspected) exposure to other viral communicable diseases: Secondary | ICD-10-CM | POA: Diagnosis not present

## 2018-11-08 DIAGNOSIS — B078 Other viral warts: Secondary | ICD-10-CM | POA: Diagnosis not present

## 2018-11-08 DIAGNOSIS — Z20828 Contact with and (suspected) exposure to other viral communicable diseases: Secondary | ICD-10-CM | POA: Diagnosis not present

## 2018-11-09 ENCOUNTER — Other Ambulatory Visit (HOSPITAL_COMMUNITY): Payer: Self-pay | Admitting: Nurse Practitioner

## 2018-11-09 DIAGNOSIS — Z431 Encounter for attention to gastrostomy: Secondary | ICD-10-CM

## 2018-11-13 DIAGNOSIS — Z20828 Contact with and (suspected) exposure to other viral communicable diseases: Secondary | ICD-10-CM | POA: Diagnosis not present

## 2018-11-17 ENCOUNTER — Ambulatory Visit (HOSPITAL_COMMUNITY)
Admission: RE | Admit: 2018-11-17 | Discharge: 2018-11-17 | Disposition: A | Discharge status: Home / Self Care | Payer: Medicare Other | Source: Ambulatory Visit | Attending: Nurse Practitioner | Admitting: Nurse Practitioner

## 2018-11-17 ENCOUNTER — Encounter (HOSPITAL_COMMUNITY): Payer: Self-pay | Admitting: Interventional Radiology

## 2018-11-17 ENCOUNTER — Other Ambulatory Visit: Payer: Self-pay

## 2018-11-17 DIAGNOSIS — Z431 Encounter for attention to gastrostomy: Secondary | ICD-10-CM | POA: Diagnosis not present

## 2018-11-17 DIAGNOSIS — T182XXA Foreign body in stomach, initial encounter: Secondary | ICD-10-CM | POA: Diagnosis not present

## 2018-11-17 DIAGNOSIS — X58XXXA Exposure to other specified factors, initial encounter: Secondary | ICD-10-CM | POA: Insufficient documentation

## 2018-11-17 DIAGNOSIS — G35 Multiple sclerosis: Secondary | ICD-10-CM | POA: Diagnosis not present

## 2018-11-17 HISTORY — PX: IR REPLC GASTRO/COLONIC TUBE PERCUT W/FLUORO: IMG2333

## 2018-11-17 MED ORDER — IOHEXOL 300 MG/ML  SOLN
50.0000 mL | Freq: Once | INTRAMUSCULAR | Status: AC | PRN
  Administered 2018-11-17: 15:00:00 15 mL

## 2018-11-17 MED ORDER — LIDOCAINE VISCOUS HCL 2 % MT SOLN
OROMUCOSAL | Status: AC
  Filled 2018-11-17: qty 15

## 2018-11-17 NOTE — Procedures (Signed)
Pre procedural Dx: Dysphagia, poorly functioning feeding tube. Post procedural Dx: Same  Successful fluoroscopic guided replacement of exisitng 20 Fr gastrostomy tube.   The new feeding tube is ready for immediate use.  EBL: None Complications: Inadvertent retention of the disc from the removed pull-through gastrostomy tube.   PLAN:  - Retention of the disc from the pull-through gastrostomy tube was discussed with the on-call GI physician, Dr. Tarri Glenn, and as the patient does NOT have a history of abdominal surgery or previous enteric obstruction and given his multiple medical comorbidities, the decision was made NOT to proceed with endoscopic retrieval at this time in hopes disc will pass on its own.  - The patient demonstrated excellent understanding of this conversation and knows to call the interventional radiology department if he were to experience unexplained abdominal pain or obstructive symptoms.    Ronny Bacon, MD Pager #: [phone]

## 2018-12-07 DIAGNOSIS — R41841 Cognitive communication deficit: Secondary | ICD-10-CM | POA: Diagnosis not present

## 2018-12-07 DIAGNOSIS — G35 Multiple sclerosis: Secondary | ICD-10-CM | POA: Diagnosis not present

## 2018-12-07 DIAGNOSIS — R1312 Dysphagia, oropharyngeal phase: Secondary | ICD-10-CM | POA: Diagnosis not present

## 2018-12-08 DIAGNOSIS — R1312 Dysphagia, oropharyngeal phase: Secondary | ICD-10-CM | POA: Diagnosis not present

## 2018-12-08 DIAGNOSIS — G35 Multiple sclerosis: Secondary | ICD-10-CM | POA: Diagnosis not present

## 2018-12-08 DIAGNOSIS — R41841 Cognitive communication deficit: Secondary | ICD-10-CM | POA: Diagnosis not present

## 2018-12-09 DIAGNOSIS — R1312 Dysphagia, oropharyngeal phase: Secondary | ICD-10-CM | POA: Diagnosis not present

## 2018-12-09 DIAGNOSIS — G35 Multiple sclerosis: Secondary | ICD-10-CM | POA: Diagnosis not present

## 2018-12-09 DIAGNOSIS — R41841 Cognitive communication deficit: Secondary | ICD-10-CM | POA: Diagnosis not present

## 2018-12-11 DIAGNOSIS — R1312 Dysphagia, oropharyngeal phase: Secondary | ICD-10-CM | POA: Diagnosis not present

## 2018-12-11 DIAGNOSIS — G35 Multiple sclerosis: Secondary | ICD-10-CM | POA: Diagnosis not present

## 2018-12-11 DIAGNOSIS — R41841 Cognitive communication deficit: Secondary | ICD-10-CM | POA: Diagnosis not present

## 2018-12-13 DIAGNOSIS — G35 Multiple sclerosis: Secondary | ICD-10-CM | POA: Diagnosis not present

## 2018-12-13 DIAGNOSIS — R1312 Dysphagia, oropharyngeal phase: Secondary | ICD-10-CM | POA: Diagnosis not present

## 2018-12-13 DIAGNOSIS — R41841 Cognitive communication deficit: Secondary | ICD-10-CM | POA: Diagnosis not present

## 2018-12-14 DIAGNOSIS — E785 Hyperlipidemia, unspecified: Secondary | ICD-10-CM | POA: Diagnosis not present

## 2018-12-14 DIAGNOSIS — R7989 Other specified abnormal findings of blood chemistry: Secondary | ICD-10-CM | POA: Diagnosis not present

## 2018-12-14 DIAGNOSIS — G35 Multiple sclerosis: Secondary | ICD-10-CM | POA: Diagnosis not present

## 2018-12-14 DIAGNOSIS — I10 Essential (primary) hypertension: Secondary | ICD-10-CM | POA: Diagnosis not present

## 2018-12-16 DIAGNOSIS — E785 Hyperlipidemia, unspecified: Secondary | ICD-10-CM | POA: Diagnosis not present

## 2019-08-15 IMAGING — CT CT HEAD W/O CM
3 of 6 series · 15 of 47 positions shown, 18 images · non-contrast
Comparison: Brain MRI 04/09/2012.  Head CT 04/08/2012.

CLINICAL DATA: 56-year-old male with confusion, fever, difficulty
swallowing. Chronic multiple sclerosis.

EXAM:
CT HEAD WITHOUT CONTRAST
TECHNIQUE: Contiguous axial images were obtained from the base of the skull
through the vertex without intravenous contrast.

[Series 2: head wo · axial · 0.49mm/px · z∈[+38,+198]mm · 10 of 38 slices shown, 13 images]
[im 3/38  brain]
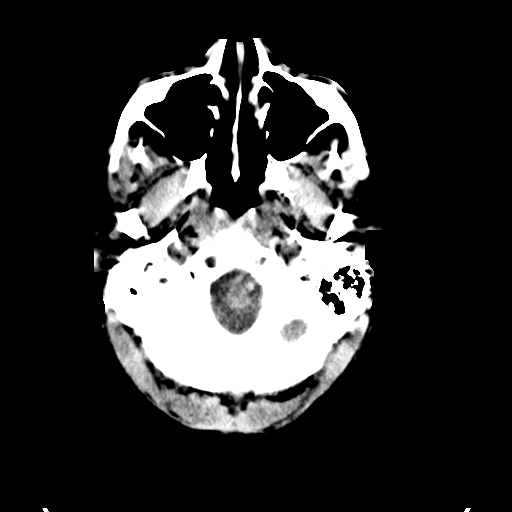
[im 3/38  bone]
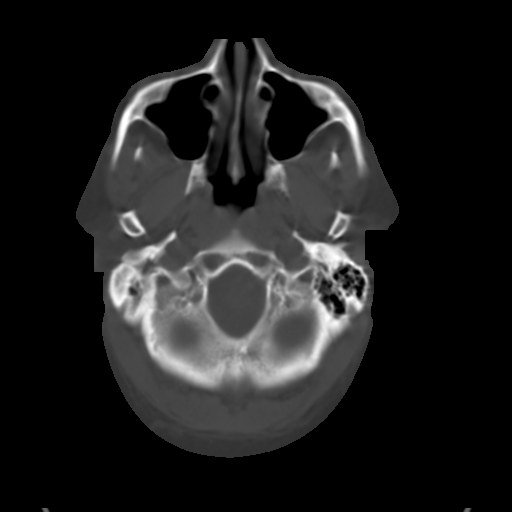
[im 6/38  brain]
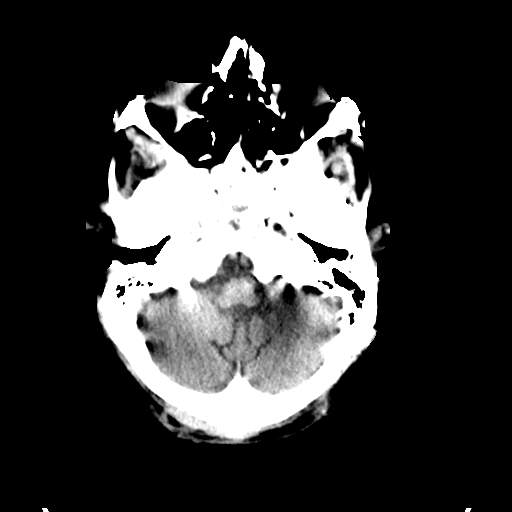
[im 11/38  brain]
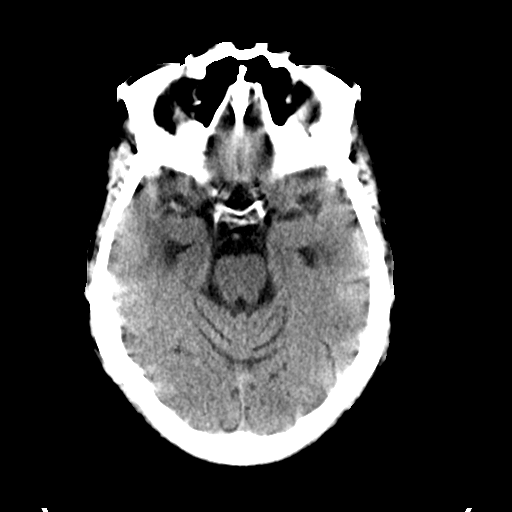
[im 14/38  brain]
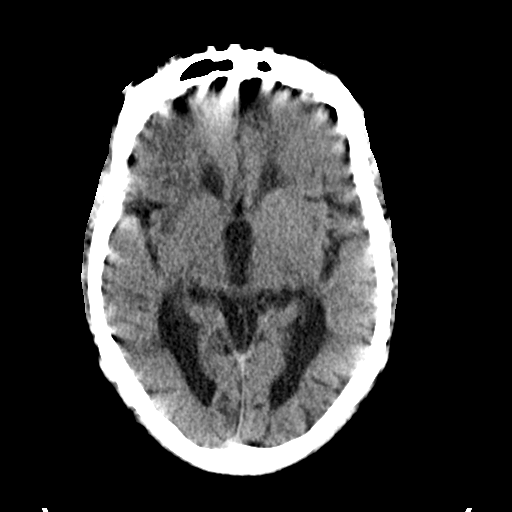
[im 16/38  brain]
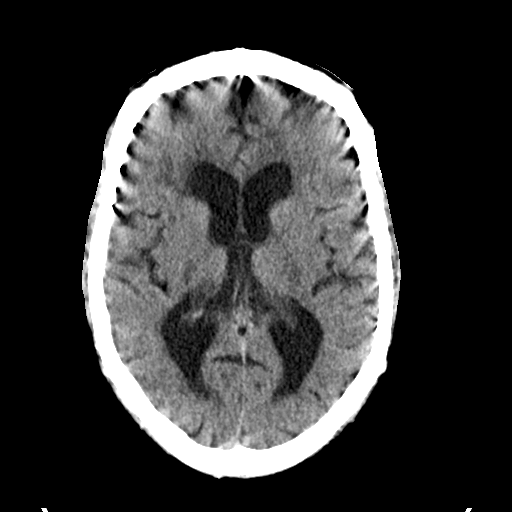
[im 16/38  bone]
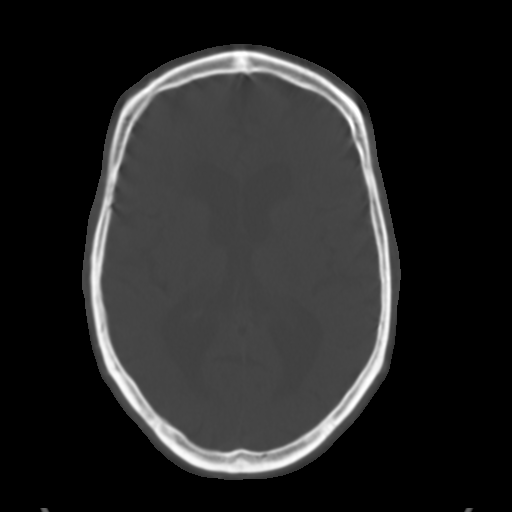
[im 22/38  brain]
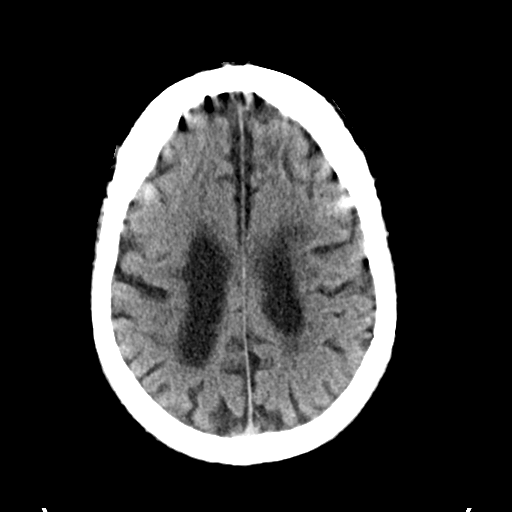
[im 24/38  brain]
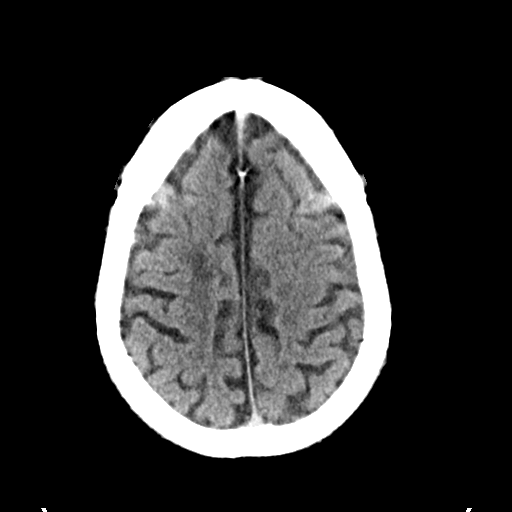
[im 27/38  brain]
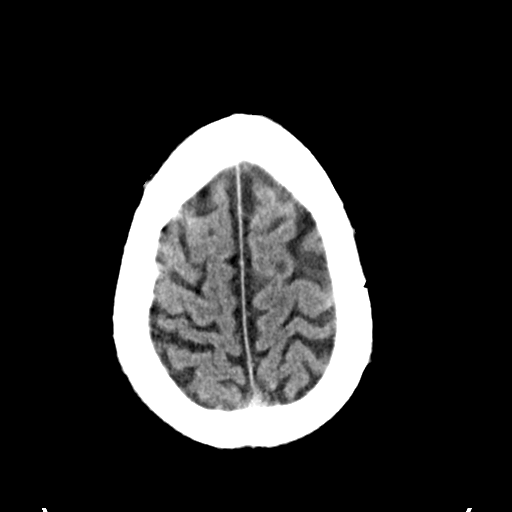
[im 32/38  brain]
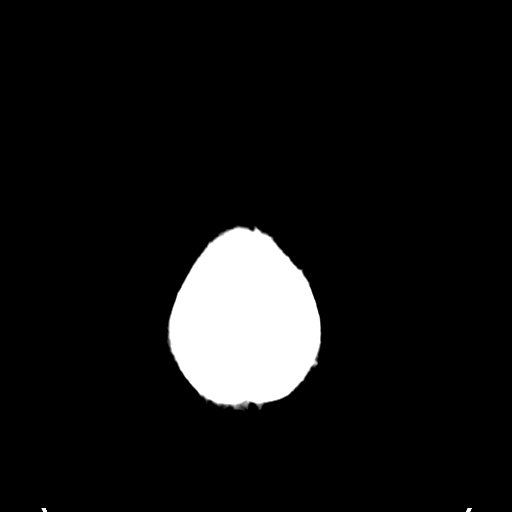
[im 32/38  bone]
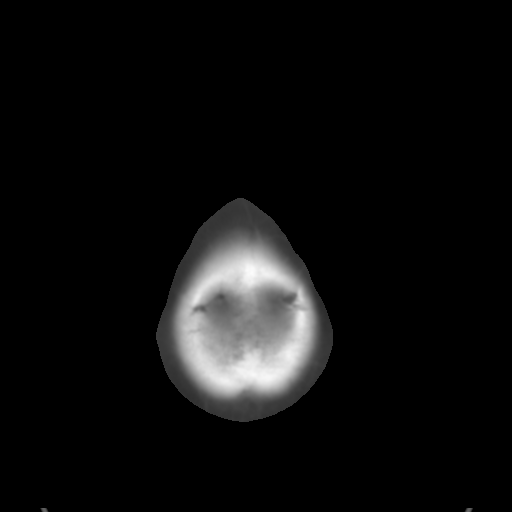
[im 35/38  brain]
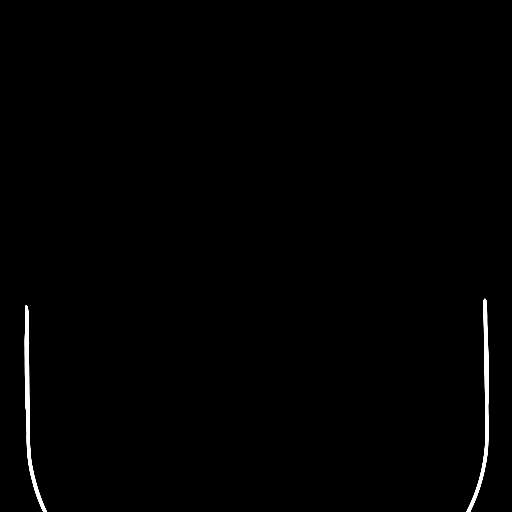

[Series 4: coronal soft tissue · coronal · 0.37mm/px · 3 of 81 slices shown]
[im 21/81  brain]
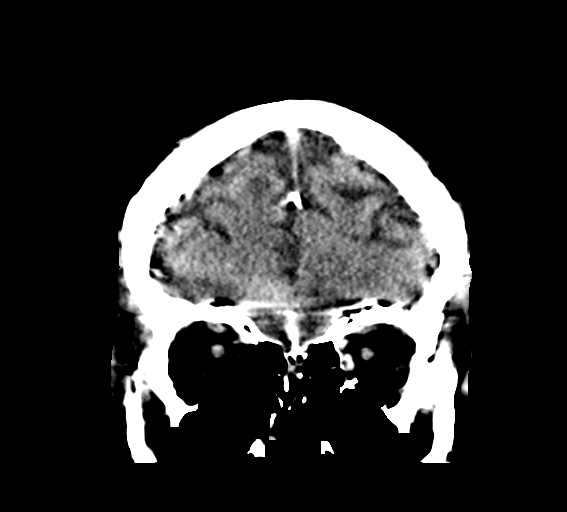
[im 41/81  brain]
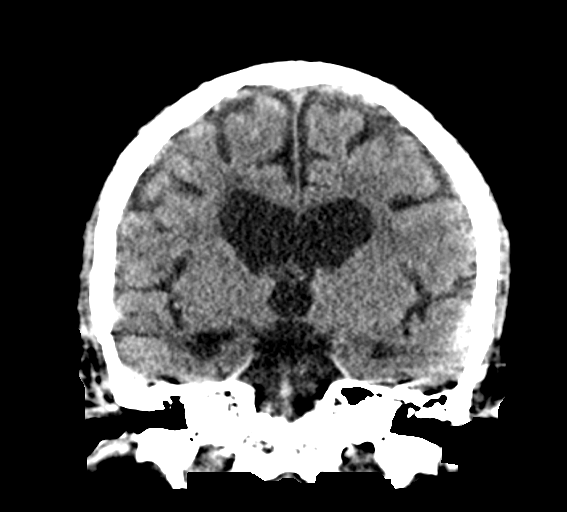
[im 61/81  brain]
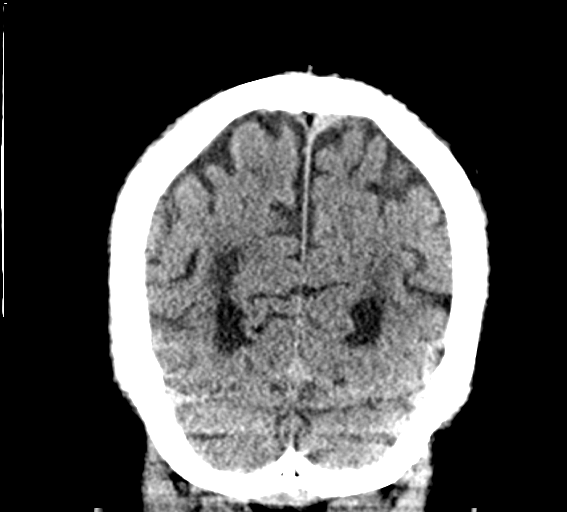

[Series 9: sagittal soft tissue · sagittal · 0.39mm/px · 2 of 67 slices shown]
[im 23/67  brain]
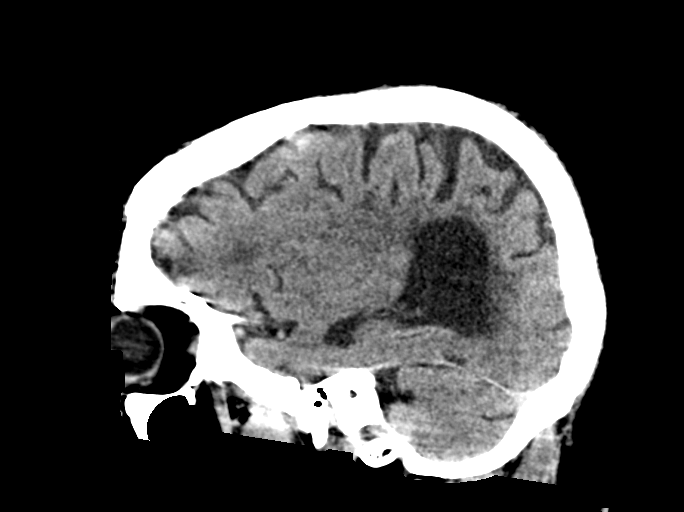
[im 45/67  brain]
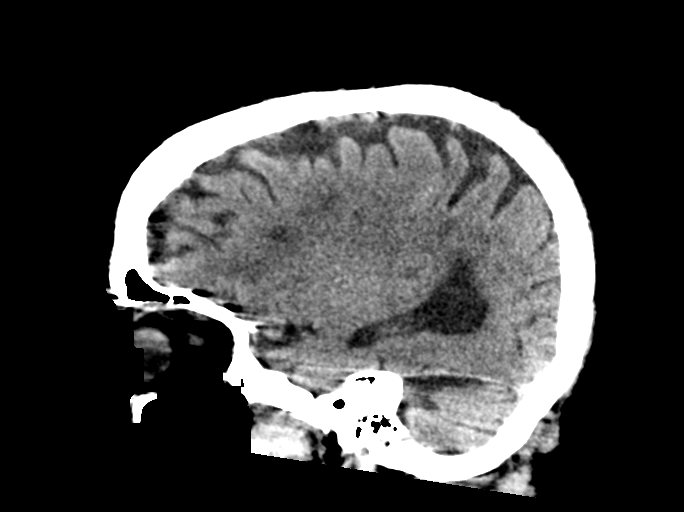

[15 of 47 positions shown; findings below may reference images not displayed]

FINDINGS: Brain: Chronically age advanced cerebral volume loss with ex vacuo
enlargement of the ventricles. Better demonstrated on the prior MRI,
this primarily corresponds to cerebral white matter volume loss,
with associated chronic white matter signal abnormality compatible
with the given history of chronic demyelinating disease.

No midline shift,mass effect, evidence of mass lesion, intracranial
hemorrhage or evidence of cortically based acute infarction.

Vascular: No suspicious intracranial vascular hyperdensity.

Skull: Intact.  No acute osseous abnormality identified.

Sinuses/Orbits: Mild sphenoid sinus mucosal thickening is new since

Other visible paranasal sinuses and mastoids are stable and well
pneumatized.

Other: Visualized orbits and scalp soft tissues are within normal
limits.
IMPRESSION: 1. Chronically advanced cerebral white matter volume loss in keeping
with severe chronic multiple sclerosis as demonstrated by MRI in
2.  No acute intracranial abnormality.

## 2020-01-03 ENCOUNTER — Other Ambulatory Visit: Payer: Self-pay

## 2020-01-03 ENCOUNTER — Institutional Professional Consult (permissible substitution): Payer: Medicare Other | Admitting: Pulmonary Disease

## 2020-01-26 ENCOUNTER — Institutional Professional Consult (permissible substitution): Payer: Medicare Other | Admitting: Internal Medicine

## 2020-01-27 ENCOUNTER — Encounter: Payer: Self-pay | Admitting: Pulmonary Disease

## 2020-01-27 ENCOUNTER — Ambulatory Visit (INDEPENDENT_AMBULATORY_CARE_PROVIDER_SITE_OTHER): Payer: Medicare Other | Admitting: Pulmonary Disease

## 2020-01-27 ENCOUNTER — Institutional Professional Consult (permissible substitution): Payer: Medicare Other | Admitting: Internal Medicine

## 2020-01-27 ENCOUNTER — Other Ambulatory Visit: Payer: Self-pay

## 2020-01-27 VITALS — BP 124/76 | HR 66 | Temp 97.1°F

## 2020-01-27 DIAGNOSIS — R0602 Shortness of breath: Secondary | ICD-10-CM

## 2020-01-27 DIAGNOSIS — R1313 Dysphagia, pharyngeal phase: Secondary | ICD-10-CM | POA: Diagnosis not present

## 2020-01-27 NOTE — Patient Instructions (Signed)
  No concerns from a pulmonary standpoint If your supervising physician has any concerns, please have them call us

## 2020-01-27 NOTE — Progress Notes (Signed)
Subjective:    Patient ID: Mark Kramer, male    DOB: December 21, 1960, 60 y.o.   MRN: [id number]  HPI  Chief Complaint  Patient presents with  . Pulmonary Consult    Referred by Dr Judi Cong for SOB. Pt states he is not having any SOB or other respiratory problems.    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 On questioning the patient, he denies dyspnea, cough, wheezing or recurrent pneumonias. I note a hospitalization 09/2016 for pneumonia and acute hypoxic respiratory failure with sepsis which is attributed to aspiration.  He subsequently required a PEG and has been on PEG feeds since then.  He does supplement orally. He is a complete assist and needs assistance with feeding.  I called the nursing home and spoke to supervising nurse who was unable to give any  cause for referral.  I have reviewed his epic chart and care everywhere. He does not have any sleep-related symptoms He is a never smoker    Past Medical History:  Diagnosis Date  . Dysphagia   . Grave's disease   . High cholesterol   . Hypothyroidism   . Multiple sclerosis ([place])   . Muscle weakness   . Pneumonia    Past Surgical History:  Procedure Laterality Date  . APPENDECTOMY    . BIOPSY  10/31/2016   Procedure: BIOPSY;  Surgeon: Danie Binder, MD;  [facility];  Service: Endoscopy;;  gastric   . ESOPHAGOGASTRODUODENOSCOPY N/A 10/31/2016   Procedure: ESOPHAGOGASTRODUODENOSCOPY (EGD);  Surgeon: Danie Binder, MD;  [facility];  Service: Endoscopy;  Laterality: N/A;  2:00pm  . IR GASTROSTOMY TUBE MOD SED  11/14/2016  . IR REPLC GASTRO/COLONIC TUBE PERCUT W/FLUORO  11/17/2018    No Known Allergies  Social History   Socioeconomic History  . Marital status: Divorced    Spouse name: Not on file  . Number of children: Not on file  . Years of education: Not on file  . Highest  education level: Not on file  Occupational History  . Not on file  Tobacco Use  . Smoking status: Never Smoker  . Smokeless tobacco: Never Used  Substance and Sexual Activity  . Alcohol use: No  . Drug use: No  . Sexual activity: Never  Other Topics Concern  . Not on file  Social History Narrative  . Not on file   Social Determinants of Health   Financial Resource Strain: Not on file  Food Insecurity: Not on file  Transportation Needs: Not on file  Physical Activity: Not on file  Stress: Not on file  Social Connections: Not on file  Intimate Partner Violence: Not on file     No family history on file.   Review of Systems Constitutional: negative for anorexia, fevers and sweats  Eyes: negative for irritation, redness and visual disturbance  Ears, nose, mouth, throat, and face: negative for earaches, epistaxis, nasal congestion and sore throat  Respiratory: negative for cough, dyspnea on exertion, sputum and wheezing  Cardiovascular: negative for chest pain, dyspnea, lower extremity edema, orthopnea, palpitations and syncope  Gastrointestinal: negative for abdominal pain, constipation, diarrhea, melena, nausea and vomiting  Genitourinary:negative for dysuria, frequency and hematuria  Hematologic/lymphatic: negative for bleeding, easy bruising and lymphadenopathy  Musculoskeletal:negative for arthralgias, muscle weakness and stiff joints  Neurological: +for coordination problems, gait problems,  and weakness  No headaches Endocrine: negative for diabetic symptoms including polydipsia,  polyuria and weight loss     Objective:   Physical Exam  Gen. Pleasant, well-nourished, in no distress, wheelchair ENT - no thrush, no pallor/icterus,no post nasal drip,, soft voice Neck: No JVD, no thyromegaly, no carotid bruits Lungs: no use of accessory muscles, no dullness to percussion, clear without rales or rhonchi  Cardiovascular: Rhythm regular, heart sounds  normal, no murmurs  or gallops, no peripheral edema Musculoskeletal: No deformities, no cyanosis or clubbing  Neuro -upper extremities power 3/5, lower extremities 2/5       Assessment & Plan:    No pulmonary pathology detected.  He does have dysphagia and is at risk for aspiration.  He does supplement orally. I note recent ED visit for dislodged G-tube which was replaced No further testing required from pulmonary standpoint. We will be available as needed I have asked his supervising physician to reach out to [place] if he has any concerns/questions

## 2021-07-01 ENCOUNTER — Ambulatory Visit (INDEPENDENT_AMBULATORY_CARE_PROVIDER_SITE_OTHER): Payer: Medicare Other | Admitting: Podiatry

## 2021-07-01 ENCOUNTER — Encounter: Payer: Self-pay | Admitting: Podiatry

## 2021-07-01 DIAGNOSIS — B351 Tinea unguium: Secondary | ICD-10-CM

## 2021-07-01 DIAGNOSIS — M79675 Pain in left toe(s): Secondary | ICD-10-CM

## 2021-07-01 DIAGNOSIS — M79674 Pain in right toe(s): Secondary | ICD-10-CM

## 2021-07-01 DIAGNOSIS — L600 Ingrowing nail: Secondary | ICD-10-CM | POA: Diagnosis not present

## 2021-07-01 NOTE — Progress Notes (Signed)
Subjective:   Patient ID: Mark Kramer, male   DOB: 61 y.o.   MRN: [id number]   HPI Patient presents with caregiver with chronic nail disease 1-5 both feet and just recently has lost the hallux nail and the second nail left and was just concerned about this   Review of Systems  All other systems reviewed and are negative.       Objective:  Physical Exam Vitals and nursing note reviewed.  Constitutional:      Appearance: He is well-developed.  Pulmonary:     Effort: Pulmonary effort is normal.  Musculoskeletal:        General: Normal range of motion.  Skin:    General: Skin is warm.  Neurological:     Mental Status: He is alert.     Neurovascular status found to be moderately diminished but intact but weak with patient found of diminished range of motion and in a wheelchair with history of stroke.  Found to have thick yellow brittle nailbeds 1-5 right 345 left better hard for him to cut and caregiver also cannot take care of.  Good digital perfusion well oriented and in wheelchair     Assessment:  Chronic mycotic nail infections with pain 1-5 right 345 left     Plan:  Nail debridement 1-5 right 345 left no angiogenic bleeding reappoint as needed for routine care earlier if any issues were to occur

## 2022-02-25 ENCOUNTER — Institutional Professional Consult (permissible substitution): Payer: Medicare Other | Admitting: Neurology

## 2022-03-18 ENCOUNTER — Encounter: Payer: Self-pay | Admitting: Neurology

## 2022-03-18 ENCOUNTER — Ambulatory Visit (INDEPENDENT_AMBULATORY_CARE_PROVIDER_SITE_OTHER): Payer: Medicare Other | Admitting: Neurology

## 2022-03-18 VITALS — BP 121/70 | HR 53 | Ht 74.0 in

## 2022-03-18 DIAGNOSIS — F32A Depression, unspecified: Secondary | ICD-10-CM

## 2022-03-18 DIAGNOSIS — Z993 Dependence on wheelchair: Secondary | ICD-10-CM

## 2022-03-18 DIAGNOSIS — R5383 Other fatigue: Secondary | ICD-10-CM

## 2022-03-18 DIAGNOSIS — F09 Unspecified mental disorder due to known physiological condition: Secondary | ICD-10-CM

## 2022-03-18 DIAGNOSIS — E559 Vitamin D deficiency, unspecified: Secondary | ICD-10-CM

## 2022-03-18 DIAGNOSIS — G35 Multiple sclerosis: Secondary | ICD-10-CM

## 2022-03-18 DIAGNOSIS — Z79899 Other long term (current) drug therapy: Secondary | ICD-10-CM

## 2022-03-18 NOTE — Progress Notes (Signed)
[facility]  PATIENT: Mark Kramer DOB: [birth year]  REFERRING DOCTOR OR PCP:  Stoney Bang SOURCE: records and imaging reports and labs from [place]  _________________________________   HISTORICAL  CHIEF COMPLAINT:  Chief Complaint  Patient presents with   Consult    Pt in room 11,alone pt is in wheelchair. Here for consult for tremors. patient has MS, reports tremors in hands pt said this is not new. Lives in [place] home.    HISTORY OF PRESENT ILLNESS:  Mr. Papenfuss is a 62 year old man with multiple sclerosis who I last saw in 2018.[name]  His MS was diagnosed in 2000 .   Currently he is on fingolimod and tolerates it well.   He has no recent exacerbation but has noted slow progression.       Currently, Gait progressively worsened and he became wheelchair dependent around 2015.  He has bilateral leg weakness   He does not assist in a meaningful way with transfers and a Harrel Lemon lift is used..   Both legs are fairly symmetrically weak and spastic -- slightly better on left at times.   He also has severe ataxia and tremor.   The tremor is very high amplitude.      For the tremor, he is on propranolol   Due to the tremor/ataxia, he needs assistance to eat.    He denies any significant numbness or dysesthesia.    He takes nadolol and primidone for the tremor.   He denies numbness or dysesthesia.     Bladder function is poor with little voluntary control.  He usually uses adult diaper.      He denies any MS related vision issue.  He wears corrective lenses.    He denies fatigue most days and sleeps 8-9 hours of sleep most days.    He used to have DPSD but now sleeps on a more regular schedule.   He denies any depression but appears despondent.   He denies anxiety.     He is on duloxetine, Wellbutrin and clonazepam and tolerates that well.  Social: He is a resident of [place] in [place]   He used to work in [employer].      MS  History:   He was diagnosed with MS around 2000 .  He presented with a reduced gait -- foot drop.  He had multiple exacerbations his first few years and lost ability to write due to a severe tremor.    Initially,  he was on Rebif and was switched to [place] in 2014.  I had seen him previously at [facility]  and then again for a couple visits between 2016 and 2018 at [facility].  He tolerates Gilenya well.       IMAGING: Personally reviewed MRI brain 07/23/2018 showed advanced cortical atrophy, mild cerebellar atrophy.  There is corpus callosum atrophy.  There are scattered T2/FLAIR hyperintense foci in the periventricular, juxtacortical and deep white matter of both hemispheres.  Additional foci are present in the pons and medulla.  Compared to the study from 04/09/2012, there are no definite new lesions.  The study from 2020 was incomplete and showed a fair amount of movement artifact hindering comparison.  MRI of the cervical spine 07/23/2018 showed mild spinal cord atrophy, especially adjacent to C6.  Scattered foci are noted in the spinal cord no movement artifact limits interpretation.  There is mild disc degenerative changes at C3-C4 and C6-C7.  No spinal stenosis.  REVIEW OF SYSTEMS: Constitutional:  No fevers, chills, sweats, or change in appetite Eyes: No visual changes, double vision, eye pain Ear, nose and throat: No hearing loss, ear pain, nasal congestion, sore throat Cardiovascular: No chest pain, palpitations Respiratory:  No shortness of breath at rest or with exertion.   No wheezes GastrointestinaI: No nausea, vomiting, diarrhea, abdominal pain, fecal incontinence Genitourinary:  as above Musculoskeletal:  No neck pain, back pain Integumentary: No rash, pruritus, skin lesions Neurological: as above Psychiatric: No depression at this time.  No anxiety Endocrine: No palpitations, diaphoresis, change in appetite, change in weigh or increased  thirst Hematologic/Lymphatic:  No anemia, purpura, petechiae. Allergic/Immunologic: No itchy/runny eyes, nasal congestion, recent allergic reactions, rashes  ALLERGIES: No Known Allergies  HOME MEDICATIONS:  Current Outpatient Medications:    acetaminophen-codeine (TYLENOL #3) 300-30 MG tablet, Take 1 tablet by mouth every 6 (six) hours as needed., Disp: , Rfl:    amLODipine (NORVASC) 10 MG tablet, Take 10 mg by mouth daily., Disp: , Rfl:    buPROPion (WELLBUTRIN XL) 150 MG 24 hr tablet, Take 150 mg by mouth every morning., Disp: , Rfl:    calcium carbonate (TUMS - DOSED IN MG ELEMENTAL CALCIUM) 500 MG chewable tablet, Chew 1 tablet by mouth 2 (two) times daily., Disp: , Rfl:    Cholecalciferol (VITAMIN D3) 10 MCG (400 UNIT) tablet, Take 400 Units by mouth daily., Disp: , Rfl:    clonazePAM (KLONOPIN) 0.5 MG tablet, Take 0.5 mg by mouth 2 (two) times daily as needed for anxiety., Disp: , Rfl:    Fingolimod HCl (GILENYA) 0.5 MG CAPS, Take 1 capsule by mouth daily. For Multiple Sclerosis, Disp: , Rfl:    Icosapent Ethyl (VASCEPA) 1 g CAPS, Take 2 g by mouth 2 (two) times daily., Disp: , Rfl:    levothyroxine (SYNTHROID) 75 MCG tablet, Take 75 mcg by mouth daily before breakfast., Disp: , Rfl:    Multiple Vitamin (MULTIVITAMIN) capsule, Take 1 capsule by mouth daily., Disp: , Rfl:    omeprazole (PRILOSEC) 20 MG capsule, Take 20 mg by mouth daily., Disp: , Rfl:    polyethylene glycol powder (GLYCOLAX/MIRALAX) powder, Take 17 g by mouth daily. , Disp: , Rfl:    propranolol (INDERAL) 60 MG tablet, Take 60 mg by mouth 2 (two) times daily., Disp: , Rfl:    atorvastatin (LIPITOR) 80 MG tablet, Take 80 mg by mouth daily. (Patient not taking: Reported on 03/18/2022), Disp: , Rfl:    DULoxetine (CYMBALTA) 30 MG capsule, Take 30 mg by mouth daily. (Patient not taking: Reported on 03/18/2022), Disp: , Rfl:    furosemide (LASIX) 20 MG tablet, Take 20 mg by mouth daily. (Patient not taking: Reported on  03/18/2022), Disp: , Rfl:    ibuprofen (ADVIL,MOTRIN) 800 MG tablet, Take 800 mg by mouth every 8 (eight) hours as needed for mild pain or moderate pain.  (Patient not taking: Reported on 03/18/2022), Disp: , Rfl:    ipratropium-albuterol (DUONEB) 0.5-2.5 (3) MG/3ML SOLN, Take 3 mLs by nebulization every 6 (six) hours as needed (SOB / Wheezing).  (Patient not taking: Reported on 03/18/2022), Disp: , Rfl:    levothyroxine (SYNTHROID, LEVOTHROID) 112 MCG tablet, Take 112 mcg by mouth daily before breakfast. (Patient not taking: Reported on 03/18/2022), Disp: , Rfl:    ondansetron (ZOFRAN) 4 MG tablet, Take 4 mg by mouth every 6 (six) hours as needed for nausea or vomiting. (Patient not taking: Reported on 03/18/2022), Disp: , Rfl:   PAST MEDICAL HISTORY: Past Medical History:  Diagnosis  Date   Dysphagia    Grave's disease    High cholesterol    Hypothyroidism    Multiple sclerosis (HCC)    Muscle weakness    Pneumonia     PAST SURGICAL HISTORY: Past Surgical History:  Procedure Laterality Date   APPENDECTOMY     BIOPSY  10/31/2016   Procedure: BIOPSY;  Surgeon: Danie Binder, MD;  [facility];  Service: Endoscopy;;  gastric    ESOPHAGOGASTRODUODENOSCOPY N/A 10/31/2016   Procedure: ESOPHAGOGASTRODUODENOSCOPY (EGD);  Surgeon: Danie Binder, MD;  Location: [facility];  Service: Endoscopy;  Laterality: N/A;  2:00pm   IR GASTROSTOMY TUBE MOD SED  11/14/2016   IR REPLC GASTRO/COLONIC TUBE PERCUT W/FLUORO  11/17/2018    FAMILY HISTORY: No family history on file.  SOCIAL HISTORY:  Social History   Socioeconomic History   Marital status: Divorced    Spouse name: Not on file   Number of children: Not on file   Years of education: Not on file   Highest education level: Not on file  Occupational History   Not on file  Tobacco Use   Smoking status: Never   Smokeless tobacco: Never  Substance and Sexual Activity   Alcohol use: No   Drug use: No   Sexual activity: Never   Other Topics Concern   Not on file  Social History Narrative   Not on file   Social Determinants of Health   Financial Resource Strain: Not on file  Food Insecurity: Not on file  Transportation Needs: Not on file  Physical Activity: Not on file  Stress: Not on file  Social Connections: Not on file  Intimate Partner Violence: Not on file     PHYSICAL EXAM  Vitals:   03/18/22 0845  BP: 121/70  Pulse: (!) 53  Height: '6\' 2"'$  (1.88 m)    Body mass index is 25.68 kg/m.   General: The patient is well-developed and well-nourished and in no acute distress  Neck: The neck is supple, no carotid bruits are noted.  The neck is nontender.  Cardiovascular: The heart has a regular rate and rhythm with a normal S1 and S2. There were no murmurs, gallops or rubs.   Musculoskeletal:  Back is nontender  Neurologic Exam  Mental status: The patient is alert and oriented x 3 at the time of the examination. The patient has apparent normal recent and remote memory, with a reduced attention span and concentration ability.   Speech content is normal.  Cranial nerves: Extraocular movements show reduced upgaze..  Pupils are equal, round, and reactive to light and accomodation.  Visual fields are full.  Facial symmetry is present. There is good facial sensation to soft touch bilaterally.Facial strength is normal.  Trapezius and sternocleidomastoid strength is normal. Mild cerebellar speech.  .  The tongue is midline, and the patient has symmetric elevation of the soft palate. No obvious hearing deficits are noted.  Motor:  There is a mild rest tremor but a more severe intention tremor, left is worse than right.  Muscle bulk is normal.   Tone is increased in the legs, left worse than right. Strength is 2 -/5 left and 2/5 right.  Arm strength is at least 4+/5  Sensory: Sensory testing is intact to pinprick, soft touch and vibration sensation in all 4 extremities.  Coordination: Cerebellar testing  reveals poor finger-nose-finger.  Left greater than right ataxia.  He is unable to do heel-to-shin..  Gait and station: He is  unable to stand or walk.   Reflexes: Deep tendon reflexes are normal in the arms but increase in the legs bilaterally.  No ankle clonus      ASSESSMENT AND PLAN  Multiple sclerosis (HCC)  Cognitive dysfunction  Depression, unspecified depression type  Other fatigue  Wheelchair dependence  In summary, Mr. Toor is a 62 year old man with a long history of multiple sclerosis who has been wheelchair dependent since about 2015.  He has not had any recent exacerbations and continues on fingolimod.  Unfortunately, he has severe neurologic impairments and is wheelchair dependent, unable to transfer independently and has leg weakness and left greater than right tremor.  MRI shows severe atrophy.   1.  Continue Gilenya for MS. I will check CBC with differential and hepatic function test today.  If lymphocyte count is very low, consider changing Gilenya to every other day..  Additional imaging at this time 2.   Continue propranolol for tremor  3.   Continue Wellbutrin and duloxetine and clonazepam for mood.  3.   Return to see me in 12 months or sooner if there are new or worsening neurologic symptoms.   Keelan A. Felecia Shelling, MD, PhD [id number], 123456 AM Certified in Neurology, Clinical Neurophysiology, Sleep Medicine, Pain Medicine and Neuroimaging  [facility] [address] [place], [place] [address] [phone]

## 2022-03-19 ENCOUNTER — Encounter: Payer: Self-pay | Admitting: *Deleted

## 2022-03-19 ENCOUNTER — Telehealth: Payer: Self-pay | Admitting: *Deleted

## 2022-03-19 LAB — HEPATIC FUNCTION PANEL
ALT: 12 IU/L (ref 0–44)
AST: 23 IU/L (ref 0–40)
Albumin: 4.4 g/dL (ref 3.8–4.9)
Alkaline Phosphatase: 125 IU/L — ABNORMAL HIGH (ref 44–121)
Bilirubin Total: 1 mg/dL (ref 0.0–1.2)
Bilirubin, Direct: 0.26 mg/dL (ref 0.00–0.40)
Total Protein: 6.8 g/dL (ref 6.0–8.5)

## 2022-03-19 LAB — CBC WITH DIFFERENTIAL/PLATELET
Basophils Absolute: 0 10*3/uL (ref 0.0–0.2)
Basos: 0 %
EOS (ABSOLUTE): 0 10*3/uL (ref 0.0–0.4)
Eos: 1 %
Hematocrit: 51.6 % — ABNORMAL HIGH (ref 37.5–51.0)
Hemoglobin: 16.8 g/dL (ref 13.0–17.7)
Immature Grans (Abs): 0 10*3/uL (ref 0.0–0.1)
Immature Granulocytes: 1 %
Lymphocytes Absolute: 0.2 10*3/uL — ABNORMAL LOW (ref 0.7–3.1)
Lymphs: 7 %
MCH: 27.7 pg (ref 26.6–33.0)
MCHC: 32.6 g/dL (ref 31.5–35.7)
MCV: 85 fL (ref 79–97)
Monocytes Absolute: 0.4 10*3/uL (ref 0.1–0.9)
Monocytes: 13 %
Neutrophils Absolute: 2.5 10*3/uL (ref 1.4–7.0)
Neutrophils: 78 %
Platelets: 210 10*3/uL (ref 150–450)
RBC: 6.06 x10E6/uL — ABNORMAL HIGH (ref 4.14–5.80)
RDW: 14.3 % (ref 11.6–15.4)
WBC: 3.3 10*3/uL — ABNORMAL LOW (ref 3.4–10.8)

## 2022-03-19 LAB — VITAMIN D 25 HYDROXY (VIT D DEFICIENCY, FRACTURES): Vit D, 25-Hydroxy: 42.7 ng/mL (ref 30.0–100.0)

## 2022-03-19 NOTE — Telephone Encounter (Signed)
[facility] at  [phone]. Informed that they are now called [facility] for Nursing/Rehab. She transferred me to nurse. Spoke w/ Sherlon Handing. Would like letter/results faxed to them at [phone]. Faxed letter/results to them. Received fax confirmation.  [facility] for Nursing and [facility] in [place], [place] Address: [address], [place], [place] [address] Hours: Open 24 hours Phone: [phone] Fax: [phone]

## 2022-03-19 NOTE — Telephone Encounter (Signed)
-----   Message from [name], MD sent at 03/19/2022  8:40 AM EST ----- The lymphocyte count was lower than typical for fingolimod.  Therefore, I want him to change the dose to every other day.  He lives in a facility and we would likely have to send a signed order

## 2022-05-19 ENCOUNTER — Institutional Professional Consult (permissible substitution): Payer: Medicare Other | Admitting: Neurology

## 2022-06-20 ENCOUNTER — Ambulatory Visit (INDEPENDENT_AMBULATORY_CARE_PROVIDER_SITE_OTHER): Payer: Medicare Other | Admitting: Podiatry

## 2022-06-20 ENCOUNTER — Encounter: Payer: Self-pay | Admitting: Podiatry

## 2022-06-20 DIAGNOSIS — M79675 Pain in left toe(s): Secondary | ICD-10-CM

## 2022-06-20 DIAGNOSIS — M79674 Pain in right toe(s): Secondary | ICD-10-CM | POA: Diagnosis not present

## 2022-06-20 DIAGNOSIS — B351 Tinea unguium: Secondary | ICD-10-CM

## 2022-06-22 NOTE — Progress Notes (Signed)
Subjective:   Patient ID: Mark Kramer, male   DOB: 62 y.o.   MRN: [id number]   HPI Patient presents with all nails being thickened and aggravating for him patient is in wheelchair cannot take care of this himself   ROS      Objective:  Physical Exam  Neurovascular status unchanged thick yellow brittle nailbeds 1-5 both feet painful     Assessment:  Chronic mycotic nail infection with pain 1-5 both feet     Plan:  Debridement nailbeds 1-5 both feet neurogenic bleeding reappoint routine care

## 2022-12-08 ENCOUNTER — Other Ambulatory Visit (HOSPITAL_COMMUNITY): Payer: Medicare Other

## 2022-12-19 NOTE — H&P (Signed)
Surgical History & Physical  Patient Name: Mark Kramer  DOB: 07-30-1960  Surgery: Cataract extraction with intraocular lens implant phacoemulsification; Right Eye Surgeon: Pecolia Ades MD Surgery Date: 12/26/2022 Pre-Op Date: 11/26/2022  HPI: A 42 Yr. old male patient present for complete eye exam/cataract eval. Patient reads and watches TV. His vision is making this more difficult. Vision has gradually changed over time in both eyes. Blurred vision is constant. This is negatively affecting the patient's quality of life and the patient is unable to function adequately in life with the current level of vision.  Medical History: Cataracts  High Blood Pressure LDL Lung Problems MS, ADHD, Acid reflux, Depression, Anxiety, Cognitive Disorder, Gastrostomy feeding tube, hypoglycemia, tremor Thyroid Problems  Review of Systems Cardiovascular High Blood Pressure Endocrine Hypothyroidism, hypoglycemia Gastrointestinal acid reflux Musculoskeletal Stiffness, wheelchair bound Neurological Multiple Sclerosis, Tremors Psychiatry Anxiety, Depression, ADHD All recorded systems are negative except as noted above.  Social Never smoked   Medication Ibuprofen, Miralax, Multivitamin, Vitamin D2,  Vascepa ,  propranolol ,  clonazepam ,  Tubersol ,  omeprazole ,  levothyroxine ,  Gilenya ,  bupropion HCl ,  atorvastatin ,  amlodipine-benazepril   Sx/Procedures Feeding tube  Drug Allergies  NKDA  History & Physical: Heent: cataracts  NECK: supple without bruits LUNGS: lungs clear to auscultation CV: regular rate and rhythm Abdomen: soft and non-tender  Impression & Plan: Assessment: 1.  COMBINED FORMS AGE RELATED CATARACT; Both Eyes (H25.813) 2.  Myopia ; Both Eyes (H52.13) 3.  MULTIPLE SCLEROSIS MS ; Both Eyes (G35) 4.  OPTIC NERVE ATROPHY OTHER ; Both Eyes (H47.292)  Plan: 1.  Cataracts are visually significant and account for the patient's complaints. Discussed all risks,  benefits, procedures and recovery, including infection, loss of vision and eye, need for glasses after surgery or additional procedures. Patient understands changing glasses will not improve vision. Patient indicated understanding of procedure. All questions answered. Patient desires to have surgery, recommend phacoemulsification with intraocular lens. Patient to have preliminary testing necessary ([employer]os/IOL Master, Mac OCT, TOPO) Educational materials provided:Cataract. RECOMMEND STANDARD VS EYEHANCE VS TORIC VS VIVTY  Plan: - Proceed with cataract surgery OD followed by OS - DIB00 with -1.50 target in each eye - Discussed vision will still be limited by optic neuropathy - MS with limited mobility - No prior eye surgery  2.  Continue with current for now  3.  Stable. Findings, prognosis and treatment options reviewed. Signs of prior resolved optic neuritis.  4.  Stable.

## 2022-12-22 ENCOUNTER — Encounter (HOSPITAL_COMMUNITY)
Admission: RE | Admit: 2022-12-22 | Discharge: 2022-12-22 | Disposition: A | Discharge status: Home / Self Care | Payer: Medicare Other | Source: Ambulatory Visit | Attending: Optometry | Admitting: Optometry

## 2022-12-26 ENCOUNTER — Encounter (HOSPITAL_COMMUNITY)
Admission: RE | Disposition: A | Discharge status: Skilled Nursing Facility | Payer: Self-pay | Source: Home / Self Care | Attending: Optometry

## 2022-12-26 ENCOUNTER — Other Ambulatory Visit: Payer: Self-pay

## 2022-12-26 ENCOUNTER — Ambulatory Visit (HOSPITAL_COMMUNITY)
Admission: RE | Admit: 2022-12-26 | Discharge: 2022-12-26 | Disposition: A | Discharge status: Skilled Nursing Facility | Payer: Medicare Other | Attending: Optometry | Admitting: Optometry

## 2022-12-26 ENCOUNTER — Ambulatory Visit (HOSPITAL_COMMUNITY): Payer: Medicare Other | Admitting: Anesthesiology

## 2022-12-26 DIAGNOSIS — H2511 Age-related nuclear cataract, right eye: Secondary | ICD-10-CM | POA: Diagnosis not present

## 2022-12-26 DIAGNOSIS — I10 Essential (primary) hypertension: Secondary | ICD-10-CM | POA: Diagnosis not present

## 2022-12-26 DIAGNOSIS — G35 Multiple sclerosis: Secondary | ICD-10-CM | POA: Diagnosis not present

## 2022-12-26 DIAGNOSIS — E039 Hypothyroidism, unspecified: Secondary | ICD-10-CM | POA: Diagnosis not present

## 2022-12-26 DIAGNOSIS — H5213 Myopia, bilateral: Secondary | ICD-10-CM | POA: Diagnosis not present

## 2022-12-26 DIAGNOSIS — H25813 Combined forms of age-related cataract, bilateral: Secondary | ICD-10-CM | POA: Insufficient documentation

## 2022-12-26 DIAGNOSIS — F32A Depression, unspecified: Secondary | ICD-10-CM | POA: Diagnosis not present

## 2022-12-26 DIAGNOSIS — H4720 Unspecified optic atrophy: Secondary | ICD-10-CM | POA: Insufficient documentation

## 2022-12-26 HISTORY — PX: CATARACT EXTRACTION W/PHACO: SHX586

## 2022-12-26 LAB — GLUCOSE, CAPILLARY: Glucose-Capillary: 88 mg/dL (ref 70–99)

## 2022-12-26 SURGERY — PHACOEMULSIFICATION, CATARACT, WITH IOL INSERTION
Anesthesia: Monitor Anesthesia Care | Site: Eye | Laterality: Right

## 2022-12-26 MED ORDER — SODIUM CHLORIDE 0.9% FLUSH
10.0000 mL | Freq: Two times a day (BID) | INTRAVENOUS | Status: DC
  Administered 2022-12-26: 5 mL via INTRAVENOUS

## 2022-12-26 MED ORDER — PHENYLEPHRINE-KETOROLAC 1-0.3 % IO SOLN
INTRAOCULAR | Status: DC | PRN
  Administered 2022-12-26: 500 mL via OPHTHALMIC

## 2022-12-26 MED ORDER — MIDAZOLAM HCL 5 MG/5ML IJ SOLN
INTRAMUSCULAR | Status: DC | PRN
  Administered 2022-12-26: 1 mg via INTRAVENOUS

## 2022-12-26 MED ORDER — MOXIFLOXACIN HCL 5 MG/ML IO SOLN
INTRAOCULAR | Status: DC | PRN
  Administered 2022-12-26: .3 [drp] via OPHTHALMIC

## 2022-12-26 MED ORDER — TETRACAINE HCL 0.5 % OP SOLN
1.0000 [drp] | OPHTHALMIC | Status: AC
  Administered 2022-12-26 (×3): 1 [drp] via OPHTHALMIC

## 2022-12-26 MED ORDER — BSS IO SOLN
INTRAOCULAR | Status: DC | PRN
  Administered 2022-12-26: 15 mL via INTRAOCULAR

## 2022-12-26 MED ORDER — SIGHTPATH DOSE#1 NA HYALUR & NA CHOND-NA HYALUR IO KIT
PACK | INTRAOCULAR | Status: DC | PRN
  Administered 2022-12-26: 1 via OPHTHALMIC

## 2022-12-26 MED ORDER — MIDAZOLAM HCL 2 MG/2ML IJ SOLN
INTRAMUSCULAR | Status: AC
  Filled 2022-12-26: qty 2

## 2022-12-26 MED ORDER — POVIDONE-IODINE 5 % OP SOLN
OPHTHALMIC | Status: DC | PRN
  Administered 2022-12-26: 1 via OPHTHALMIC

## 2022-12-26 MED ORDER — PHENYLEPHRINE HCL 2.5 % OP SOLN
1.0000 [drp] | OPHTHALMIC | Status: AC
  Administered 2022-12-26 (×3): 1 [drp] via OPHTHALMIC

## 2022-12-26 MED ORDER — STERILE WATER FOR IRRIGATION IR SOLN
Status: DC | PRN
  Administered 2022-12-26: 250 mL

## 2022-12-26 MED ORDER — LIDOCAINE HCL 3.5 % OP GEL
1.0000 | Freq: Once | OPHTHALMIC | Status: AC
  Administered 2022-12-26: 1 via OPHTHALMIC

## 2022-12-26 MED ORDER — LIDOCAINE HCL (PF) 1 % IJ SOLN
INTRAMUSCULAR | Status: DC | PRN
  Administered 2022-12-26: 1 mL

## 2022-12-26 MED ORDER — TROPICAMIDE 1 % OP SOLN
1.0000 [drp] | OPHTHALMIC | Status: AC
  Administered 2022-12-26 (×3): 1 [drp] via OPHTHALMIC

## 2022-12-26 SURGICAL SUPPLY — 14 items
CATARACT SUITE SIGHTPATH (MISCELLANEOUS) ×1
CLOTH BEACON ORANGE TIMEOUT ST (SAFETY) ×1 IMPLANT
DRSG TEGADERM 4X4.75 (GAUZE/BANDAGES/DRESSINGS) ×1 IMPLANT
EYE SHIELD UNIVERSAL CLEAR (GAUZE/BANDAGES/DRESSINGS) IMPLANT
FEE CATARACT SUITE SIGHTPATH (MISCELLANEOUS) ×1 IMPLANT
GLOVE BIOGEL PI IND STRL 7.0 (GLOVE) ×2 IMPLANT
LENS IOL TECNIS EYHANCE 11.5 (Intraocular Lens) IMPLANT
NDL HYPO 18GX1.5 BLUNT FILL (NEEDLE) ×1 IMPLANT
NEEDLE HYPO 18GX1.5 BLUNT FILL (NEEDLE) ×1
PAD ARMBOARD 7.5X6 YLW CONV (MISCELLANEOUS) ×1 IMPLANT
POSITIONER HEAD 8X9X4 ADT (SOFTGOODS) ×1 IMPLANT
SYR TB 1ML LL NO SAFETY (SYRINGE) ×1 IMPLANT
TAPE SURG TRANSPORE 1 IN (GAUZE/BANDAGES/DRESSINGS) IMPLANT
WATER STERILE IRR 250ML POUR (IV SOLUTION) ×1 IMPLANT

## 2022-12-26 NOTE — Interval H&P Note (Signed)
History and Physical Interval Note:  12/26/2022 7:18 AM  The H and P was reviewed and updated. The patient was examined.  No changes were found after exam.  The surgical eye was marked.   Katharyn Schauer

## 2022-12-26 NOTE — Discharge Instructions (Signed)
Please discharge patient when stable, will follow up today with Dr. Tod Abrahamsen at the [facility] office immediately following discharge.  Leave shield in place until visit.  All paperwork with discharge instructions will be given at the office.  [facility] Address:  [address]  [place], [place] [address]  Dr. Feliciano Wynter's Phone: [phone]  

## 2022-12-26 NOTE — Transfer of Care (Signed)
Immediate Anesthesia Transfer of Care Note  Patient: Mark Kramer  Procedure(s) Performed: CATARACT EXTRACTION PHACO AND INTRAOCULAR LENS PLACEMENT (IOC) (Right: Eye)  Patient Location: PACU  Anesthesia Type:MAC  Level of Consciousness: awake  Airway & Oxygen Therapy: Patient Spontanous Breathing  Post-op Assessment: Report given to RN and Post -op Vital signs reviewed and stable  Post vital signs: Reviewed and stable  Last Vitals:  Vitals Value Taken Time  BP    Temp    Pulse    Resp    SpO2      Last Pain:  Vitals:   12/26/22 0723  PainSc: 0-No pain         Complications: No notable events documented.

## 2022-12-26 NOTE — Anesthesia Postprocedure Evaluation (Signed)
Anesthesia Post Note  Patient: Guerry Ponzi  Procedure(s) Performed: CATARACT EXTRACTION PHACO AND INTRAOCULAR LENS PLACEMENT (IOC) (Right: Eye)  Patient location during evaluation: Phase II Anesthesia Type: MAC Level of consciousness: awake Pain management: pain level controlled Vital Signs Assessment: post-procedure vital signs reviewed and stable Respiratory status: spontaneous breathing and respiratory function stable Cardiovascular status: blood pressure returned to baseline and stable Postop Assessment: no headache and no apparent nausea or vomiting Anesthetic complications: no Comments: Late entry   No notable events documented.   Last Vitals:  Vitals:   12/26/22 0723 12/26/22 0854  BP: 127/71 121/75  Pulse: (!) 49 (!) 48  Resp: 12 12  Temp: 36.5 C 36.5 C  SpO2: 100% 96%    Last Pain:  Vitals:   12/26/22 0854  TempSrc: Oral  PainSc: 0-No pain                 Windell Norfolk

## 2022-12-26 NOTE — Anesthesia Preprocedure Evaluation (Signed)
Anesthesia Evaluation  Patient identified by MRN, date of birth, ID band Patient awake    Reviewed: Allergy & Precautions, H&P , NPO status , Patient's Chart, lab work & pertinent test results, reviewed documented beta blocker date and time   Airway Mallampati: II  TM Distance: >3 FB Neck ROM: full    Dental no notable dental hx.    Pulmonary neg pulmonary ROS, pneumonia   Pulmonary exam normal breath sounds clear to auscultation       Cardiovascular Exercise Tolerance: Good hypertension, negative cardio ROS  Rhythm:regular Rate:Normal     Neuro/Psych  PSYCHIATRIC DISORDERS  Depression    negative neurological ROS  negative psych ROS   GI/Hepatic negative GI ROS, Neg liver ROS,,,  Endo/Other  negative endocrine ROSHypothyroidism    Renal/GU negative Renal ROS  negative genitourinary   Musculoskeletal   Abdominal   Peds  Hematology negative hematology ROS (+)   Anesthesia Other Findings   Reproductive/Obstetrics negative OB ROS                             Anesthesia Physical Anesthesia Plan  ASA: 3  Anesthesia Plan: MAC   Post-op Pain Management:    Induction:   PONV Risk Score and Plan:   Airway Management Planned:   Additional Equipment:   Intra-op Plan:   Post-operative Plan:   Informed Consent: I have reviewed the patients History and Physical, chart, labs and discussed the procedure including the risks, benefits and alternatives for the proposed anesthesia with the patient or authorized representative who has indicated his/her understanding and acceptance.     Dental Advisory Given  Plan Discussed with: CRNA  Anesthesia Plan Comments:        Anesthesia Quick Evaluation

## 2022-12-26 NOTE — Op Note (Signed)
Date of procedure: 12/26/22  Pre-operative diagnosis: Visually significant age-related nuclear cataract, Right Eye (H25.11)  Post-operative diagnosis: Visually significant age-related nuclear cataract, Right Eye  Procedure: Removal of cataract via phacoemulsification and insertion of intra-ocular lens J&J DIBOO +11.5D into the capsular bag of the Right Eye  Attending surgeon: Pecolia Ades, MD  Anesthesia: MAC, Topical Akten  Complications: None  Estimated Blood Loss: <67mL (minimal)  Specimens: None  Implants:  Implant Name Type Inv. Item Serial No. Manufacturer Lot No. LRB No. Used Action  LENS IOL TECNIS EYHANCE 11.5 - [id number] Intraocular Lens LENS IOL TECNIS EYHANCE 11.5 [id number] [employer]IGHTPATH  Right 1 Implanted    Indications:  Visually significant age-related cataract, Right Eye  Procedure:  The patient was seen and identified in the pre-operative area. The operative eye was identified and dilated.  The operative eye was marked.  Topical anesthesia was administered to the operative eye.     The patient was then to the operative suite and placed in the supine position.  A timeout was performed confirming the patient, procedure to be performed, and all other relevant information.   The patient's face was prepped and draped in the usual fashion for intra-ocular surgery.  A lid speculum was placed into the operative eye and the surgical microscope moved into place and focused.  A superotemporal paracentesis was created using a 20 gauge paracentesis blade.  BSS mixed with Omidria, followed by 1% lidocaine was injected into the anterior chamber.  Viscoelastic was injected into the anterior chamber.  A temporal clear-corneal main wound incision was created using a 2.84mm microkeratome.  A continuous curvilinear capsulorrhexis was initiated using an irrigating cystitome and completed using capsulorrhexis forceps.  Hydrodissection and hydrodeliniation were performed.  Viscoelastic was  injected into the anterior chamber.  A phacoemulsification handpiece and a chopper as a second instrument were used to remove the nucleus and epinucleus. The irrigation/aspiration handpiece was used to remove any remaining cortical material.   The capsular bag was reinflated with viscoelastic, checked, and found to be intact.  The intraocular lens was inserted into the capsular bag.  The irrigation/aspiration handpiece was used to remove any remaining viscoelastic.  The clear corneal wound and paracentesis wounds were then hydrated and checked with Weck-Cels to be watertight. Moxifloxacin was instilled into the anterior chamber.  The lid-speculum and drape was removed, and the patient's face was cleaned with a wet and dry 4x4. A clear shield was taped over the eye. The patient was taken to the post-operative care unit in good condition, having tolerated the procedure well.  Post-Op Instructions: The patient will follow up at [facility] for a same day post-operative evaluation and will receive all other orders and instructions.

## 2022-12-31 ENCOUNTER — Encounter (HOSPITAL_COMMUNITY): Payer: Self-pay | Admitting: Optometry

## 2023-01-01 NOTE — H&P (Signed)
Surgical History & Physical  Patient Name: Mark Kramer  DOB: 1960-04-18  Surgery: Cataract extraction with intraocular lens implant phacoemulsification; Left Eye Surgeon: Pecolia Ades MD Surgery Date: 01/09/2023 Pre-Op Date: 12/30/2022  HPI: A 57 Yr. old male patient 1. The patient is returning after cataract surgery. The right eye is affected. Status post cataract surgery, which began 11 days ago: Since the last visit, the affected area is doing well. The patient's vision is improved. Patient is not taking medications. He states he has trouble filling out forms. The patient is having cat sx 12/27 on od. This is negatively affecting the patient's quality of life and the patient is unable to function adequately in life with the current level of vision.HPI was performed by Pecolia Ades .  Medical History: Cataracts High Blood Pressure LDL Lung Problems MS ADHD Acid reflux Depression Anxiety Cognitive Disorder, Gastrostomy-feeling tube, hypoglycemia, tremor Thyroid Problems  Review of Systems Cardiovascular High Blood Pressure Endocrine Hypothyroidism, hypoglycemia Gastrointestinal acid reflux Musculoskeletal Stiffness, wheelchair bound Neurological Multiple Sclerosis, Tremors Psychiatry Anxiety, Depression, ADHD All recorded systems are negative except as noted above.  Social Never smoked   Medication Ciprofloxacin hcl, Prednisolone acetate,  Ibuprofen, Miralax, Multivitamin, Vitamin D2,  Vascepa ,  propranolol ,  clonazepam ,  Tubersol ,  omeprazole ,  levothyroxine ,  Gilenya ,  bupropion HCl ,  atorvastatin ,  amlodipine-benazepril   Sx/Procedures Phaco c IOL OD,  Feeding tube  Drug Allergies  NKDA  History & Physical: Heent: cataract  NECK: supple without bruits LUNGS: lungs clear to auscultation CV: regular rate and rhythm Abdomen: soft and non-tender  Impression & Plan: Assessment: 1.  CATARACT EXTRACTION STATUS; Right Eye (Z98.41) 2.  INTRAOCULAR  LENS IOL (Z96.1) 3.  COMBINED FORMS AGE RELATED CATARACT; Both Eyes (H25.813)  Plan: 1.  POD#5 Continue with eye drops as directed. Reviewed all post-op precautions. Call with increased redness, swelling, pain or loss of vision. Avoid swimming pools and hot tubs for 1 week.  Healing well, near target as planned.  2.  Doing well since surgery  3.  Cataracts are visually significant and account for the patient's complaints. Discussed all risks, benefits, procedures and recovery, including infection, loss of vision and eye, need for glasses after surgery or additional procedures. Patient understands changing glasses will not improve vision. Patient indicated understanding of procedure. All questions answered. Patient desires to have surgery, recommend phacoemulsification with intraocular lens. Patient to have preliminary testing necessary ([employer]os/IOL Master, Mac OCT, TOPO) Educational materials provided: Cataract. RECOMMEND STANDARD VS EYEHANCE VS TORIC VS VIVTY  Plan: - Proceed with cataract surgery OS - DIB00 with -1.50 target in each eye - Discussed vision will still be limited by optic neuropathy - MS with limited mobility - No prior eye surgery

## 2023-01-05 ENCOUNTER — Encounter (HOSPITAL_COMMUNITY)
Admission: RE | Admit: 2023-01-05 | Discharge: 2023-01-05 | Disposition: A | Discharge status: Home / Self Care | Payer: Medicare Other | Source: Ambulatory Visit | Attending: Optometry | Admitting: Optometry

## 2023-01-05 ENCOUNTER — Encounter (HOSPITAL_COMMUNITY): Payer: Self-pay

## 2023-01-09 ENCOUNTER — Ambulatory Visit (HOSPITAL_COMMUNITY)
Admission: RE | Admit: 2023-01-09 | Discharge: 2023-01-09 | Disposition: A | Discharge status: Skilled Nursing Facility | Payer: Medicare Other | Attending: Optometry | Admitting: Optometry

## 2023-01-09 ENCOUNTER — Encounter (HOSPITAL_COMMUNITY)
Admission: RE | Disposition: A | Discharge status: Skilled Nursing Facility | Payer: Self-pay | Source: Home / Self Care | Attending: Optometry

## 2023-01-09 ENCOUNTER — Other Ambulatory Visit: Payer: Self-pay

## 2023-01-09 ENCOUNTER — Ambulatory Visit (HOSPITAL_COMMUNITY): Payer: Medicare Other | Admitting: Anesthesiology

## 2023-01-09 ENCOUNTER — Encounter (HOSPITAL_COMMUNITY): Payer: Self-pay | Admitting: Optometry

## 2023-01-09 DIAGNOSIS — G35 Multiple sclerosis: Secondary | ICD-10-CM | POA: Diagnosis not present

## 2023-01-09 DIAGNOSIS — H2512 Age-related nuclear cataract, left eye: Secondary | ICD-10-CM

## 2023-01-09 DIAGNOSIS — H25812 Combined forms of age-related cataract, left eye: Secondary | ICD-10-CM | POA: Diagnosis present

## 2023-01-09 DIAGNOSIS — Z9841 Cataract extraction status, right eye: Secondary | ICD-10-CM | POA: Diagnosis not present

## 2023-01-09 DIAGNOSIS — E039 Hypothyroidism, unspecified: Secondary | ICD-10-CM | POA: Insufficient documentation

## 2023-01-09 DIAGNOSIS — Z961 Presence of intraocular lens: Secondary | ICD-10-CM | POA: Diagnosis not present

## 2023-01-09 DIAGNOSIS — F32A Depression, unspecified: Secondary | ICD-10-CM | POA: Insufficient documentation

## 2023-01-09 DIAGNOSIS — I10 Essential (primary) hypertension: Secondary | ICD-10-CM | POA: Insufficient documentation

## 2023-01-09 HISTORY — PX: CATARACT EXTRACTION W/PHACO: SHX586

## 2023-01-09 SURGERY — PHACOEMULSIFICATION, CATARACT, WITH IOL INSERTION
Anesthesia: Monitor Anesthesia Care | Site: Eye | Laterality: Left

## 2023-01-09 MED ORDER — SIGHTPATH DOSE#1 NA HYALUR & NA CHOND-NA HYALUR IO KIT
PACK | INTRAOCULAR | Status: DC | PRN
  Administered 2023-01-09: 1 via OPHTHALMIC

## 2023-01-09 MED ORDER — BSS IO SOLN
INTRAOCULAR | Status: DC | PRN
  Administered 2023-01-09: 15 mL via INTRAOCULAR

## 2023-01-09 MED ORDER — LIDOCAINE HCL (PF) 1 % IJ SOLN
INTRAMUSCULAR | Status: AC
Start: 2023-01-09 — End: ?
  Filled 2023-01-09: qty 2

## 2023-01-09 MED ORDER — MIDAZOLAM HCL 2 MG/2ML IJ SOLN
INTRAMUSCULAR | Status: AC
  Filled 2023-01-09: qty 2

## 2023-01-09 MED ORDER — STERILE WATER FOR IRRIGATION IR SOLN
Status: DC | PRN
  Administered 2023-01-09: 250 mL

## 2023-01-09 MED ORDER — PHENYLEPHRINE HCL 2.5 % OP SOLN
1.0000 [drp] | OPHTHALMIC | Status: AC | PRN
  Administered 2023-01-09 (×3): 1 [drp] via OPHTHALMIC

## 2023-01-09 MED ORDER — SODIUM CHLORIDE 0.9% FLUSH
INTRAVENOUS | Status: DC | PRN
  Administered 2023-01-09: 5 mL via INTRAVENOUS

## 2023-01-09 MED ORDER — TETRACAINE HCL 0.5 % OP SOLN
1.0000 [drp] | OPHTHALMIC | Status: AC | PRN
Start: 2023-01-09 — End: 2023-01-09
  Administered 2023-01-09 (×3): 1 [drp] via OPHTHALMIC

## 2023-01-09 MED ORDER — PHENYLEPHRINE-KETOROLAC 1-0.3 % IO SOLN
INTRAOCULAR | Status: AC
  Filled 2023-01-09: qty 4

## 2023-01-09 MED ORDER — PHENYLEPHRINE-KETOROLAC 1-0.3 % IO SOLN
INTRAOCULAR | Status: DC | PRN
  Administered 2023-01-09: 500 mL via OPHTHALMIC

## 2023-01-09 MED ORDER — MIDAZOLAM HCL 2 MG/2ML IJ SOLN
INTRAMUSCULAR | Status: DC | PRN
  Administered 2023-01-09: 1 mg via INTRAVENOUS

## 2023-01-09 MED ORDER — TROPICAMIDE 1 % OP SOLN
1.0000 [drp] | OPHTHALMIC | Status: AC | PRN
  Administered 2023-01-09 (×3): 1 [drp] via OPHTHALMIC

## 2023-01-09 MED ORDER — MOXIFLOXACIN HCL 5 MG/ML IO SOLN
INTRAOCULAR | Status: DC | PRN
  Administered 2023-01-09: .2 mL via INTRACAMERAL

## 2023-01-09 MED ORDER — POVIDONE-IODINE 5 % OP SOLN
OPHTHALMIC | Status: DC | PRN
  Administered 2023-01-09: 1 via OPHTHALMIC

## 2023-01-09 MED ORDER — LIDOCAINE HCL (PF) 1 % IJ SOLN
INTRAMUSCULAR | Status: DC | PRN
  Administered 2023-01-09: 1 mL

## 2023-01-09 MED ORDER — LIDOCAINE HCL 3.5 % OP GEL
1.0000 | Freq: Once | OPHTHALMIC | Status: AC
  Administered 2023-01-09: 1 via OPHTHALMIC

## 2023-01-09 SURGICAL SUPPLY — 14 items
CATARACT SUITE SIGHTPATH (MISCELLANEOUS) ×1
CLOTH BEACON ORANGE TIMEOUT ST (SAFETY) ×1 IMPLANT
DRSG TEGADERM 4X4.75 (GAUZE/BANDAGES/DRESSINGS) ×1 IMPLANT
EYE SHIELD UNIVERSAL CLEAR (GAUZE/BANDAGES/DRESSINGS) IMPLANT
FEE CATARACT SUITE SIGHTPATH (MISCELLANEOUS) ×1 IMPLANT
GLOVE BIOGEL PI IND STRL 7.0 (GLOVE) ×2 IMPLANT
LENS IOL TECNIS EYHANCE 11.5 (Intraocular Lens) IMPLANT
NDL HYPO 18GX1.5 BLUNT FILL (NEEDLE) ×1 IMPLANT
NEEDLE HYPO 18GX1.5 BLUNT FILL (NEEDLE) ×1
PAD ARMBOARD 7.5X6 YLW CONV (MISCELLANEOUS) ×1 IMPLANT
POSITIONER HEAD 8X9X4 ADT (SOFTGOODS) ×1 IMPLANT
SYR TB 1ML LL NO SAFETY (SYRINGE) ×1 IMPLANT
TAPE SURG TRANSPORE 1 IN (GAUZE/BANDAGES/DRESSINGS) IMPLANT
WATER STERILE IRR 250ML POUR (IV SOLUTION) ×1 IMPLANT

## 2023-01-09 NOTE — Transfer of Care (Signed)
Immediate Anesthesia Transfer of Care Note  Patient: Mark Kramer  Procedure(s) Performed: CATARACT EXTRACTION PHACO AND INTRAOCULAR LENS PLACEMENT (IOC) (Left: Eye)  Patient Location: Short Stay  Anesthesia Type:MAC  Level of Consciousness: awake, alert , and oriented  Airway & Oxygen Therapy: Patient Spontanous Breathing  Post-op Assessment: Report given to RN and Post -op Vital signs reviewed and stable  Post vital signs: Reviewed and stable  Last Vitals:  Vitals Value Taken Time  BP 130/72 01/09/23 0925  Temp 36.7 C 01/09/23 0925  Pulse 55 01/09/23 0925  Resp 14 01/09/23 0925  SpO2 99 % 01/09/23 0925    Last Pain:  Vitals:   01/09/23 0925  TempSrc: Oral  PainSc: 0-No pain         Complications: No notable events documented.

## 2023-01-09 NOTE — Anesthesia Postprocedure Evaluation (Signed)
Anesthesia Post Note  Patient: Dierks Mclauchlan  Procedure(s) Performed: CATARACT EXTRACTION PHACO AND INTRAOCULAR LENS PLACEMENT (IOC) (Left: Eye)  Patient location during evaluation: PACU Anesthesia Type: MAC Level of consciousness: awake and alert Pain management: pain level controlled Vital Signs Assessment: post-procedure vital signs reviewed and stable Respiratory status: spontaneous breathing, nonlabored ventilation, respiratory function stable and patient connected to nasal cannula oxygen Cardiovascular status: stable and blood pressure returned to baseline Postop Assessment: no apparent nausea or vomiting Anesthetic complications: no   There were no known notable events for this encounter.   Last Vitals:  Vitals:   01/09/23 0845 01/09/23 0925  BP: (!) 142/87 130/72  Pulse: (!) 59 (!) 55  Resp: 13 14  Temp: 36.7 C 36.7 C  SpO2: 100% 99%    Last Pain:  Vitals:   01/09/23 0925  TempSrc: Oral  PainSc: 0-No pain                 Evyn Kooyman L Atalia Litzinger

## 2023-01-09 NOTE — Op Note (Signed)
Date of procedure: 01/09/23  Pre-operative diagnosis: Visually significant age-related nuclear cataract, Left Eye (H25.12)  Post-operative diagnosis: Visually significant age-related nuclear cataract, Left Eye  Procedure: Removal of cataract via phacoemulsification and insertion of intra-ocular lens J&J DIB00 +11.5D into the capsular bag of the Left Eye  Attending surgeon: Ronal Fear, MD  Anesthesia: MAC, Topical Akten  Complications: None  Estimated Blood Loss: <42mL (minimal)  Specimens: None  Implants:  Implant Name Type Inv. Item Serial No. Manufacturer Lot No. LRB No. Used Action  LENS IOL TECNIS EYHANCE 11.5 - [id number] Intraocular Lens LENS IOL TECNIS EYHANCE 11.5 [id number] [employer]  Left 1 Implanted    Indications:  Visually significant age-related cataract, Left Eye  Procedure:  The patient was seen and identified in the pre-operative area. The operative eye was identified and dilated.  The operative eye was marked.  Topical anesthesia was administered to the operative eye.     The patient was then to the operative suite and placed in the supine position.  A timeout was performed confirming the patient, procedure to be performed, and all other relevant information.   The patient's face was prepped and draped in the usual fashion for intra-ocular surgery.  A lid speculum was placed into the operative eye and the surgical microscope moved into place and focused.  An inferotemporal paracentesis was created using a 20 gauge paracentesis blade.  BSS mixed with Omidria, followed by 1% lidocaine was injected into the anterior chamber.  Viscoelastic was injected into the anterior chamber.  A temporal clear-corneal main wound incision was created using a 2.31mm microkeratome.  A continuous curvilinear capsulorrhexis was initiated using an irrigating cystitome and completed using capsulorrhexis forceps.  Hydrodissection and hydrodeliniation were performed.  Viscoelastic was  injected into the anterior chamber.  A phacoemulsification handpiece and a chopper as a second instrument were used to remove the nucleus and epinucleus. The irrigation/aspiration handpiece was used to remove any remaining cortical material.   The capsular bag was reinflated with viscoelastic, checked, and found to be intact.  The intraocular lens was inserted into the capsular bag.  The irrigation/aspiration handpiece was used to remove any remaining viscoelastic.  The clear corneal wound and paracentesis wounds were then hydrated and checked with Weck-Cels to be watertight. Moxifloxacin was instilled into the anterior chamber.  The lid-speculum and drape was removed, and the patient's face was cleaned with a wet and dry 4x4.  A clear shield was taped over the eye. The patient was taken to the post-operative care unit in good condition, having tolerated the procedure well.  Post-Op Instructions: The patient will follow up at [facility] for a same day post-operative evaluation and will receive all other orders and instructions.

## 2023-01-09 NOTE — Discharge Instructions (Signed)
Please discharge patient when stable, will follow up today with Dr. Tod Abrahamsen at the [facility] office immediately following discharge.  Leave shield in place until visit.  All paperwork with discharge instructions will be given at the office.  [facility] Address:  [address]  [place], [place] [address]  Dr. Feliciano Wynter's Phone: [phone]  

## 2023-01-09 NOTE — Interval H&P Note (Signed)
History and Physical Interval Note:  01/09/2023 8:28 AM  The H and P was reviewed and updated. The patient was examined.  No changes were found after exam.  The surgical eye was marked.  Mark Kramer

## 2023-01-09 NOTE — Anesthesia Preprocedure Evaluation (Signed)
Anesthesia Evaluation  Patient identified by MRN, date of birth, ID band Patient awake    Reviewed: Allergy & Precautions, H&P , NPO status , Patient's Chart, lab work & pertinent test results, reviewed documented beta blocker date and time   Airway Mallampati: II  TM Distance: >3 FB Neck ROM: full    Dental no notable dental hx. (+) Dental Advisory Given, Teeth Intact   Pulmonary neg pulmonary ROS, pneumonia   Pulmonary exam normal breath sounds clear to auscultation       Cardiovascular Exercise Tolerance: Good hypertension, negative cardio ROS Normal cardiovascular exam Rhythm:regular Rate:Normal     Neuro/Psych  PSYCHIATRIC DISORDERS  Depression    Multiple sclerosis negative neurological ROS  negative psych ROS   GI/Hepatic negative GI ROS, Neg liver ROS,,,  Endo/Other  negative endocrine ROSHypothyroidism  Grave's disease.  Renal/GU negative Renal ROS  negative genitourinary   Musculoskeletal   Abdominal   Peds  Hematology negative hematology ROS (+)   Anesthesia Other Findings   Reproductive/Obstetrics negative OB ROS                             Anesthesia Physical Anesthesia Plan  ASA: 3  Anesthesia Plan: MAC   Post-op Pain Management: Minimal or no pain anticipated   Induction: Intravenous  PONV Risk Score and Plan: Propofol infusion  Airway Management Planned: Nasal Cannula and Natural Airway  Additional Equipment: None  Intra-op Plan:   Post-operative Plan:   Informed Consent: I have reviewed the patients History and Physical, chart, labs and discussed the procedure including the risks, benefits and alternatives for the proposed anesthesia with the patient or authorized representative who has indicated his/her understanding and acceptance.     Dental Advisory Given  Plan Discussed with: CRNA  Anesthesia Plan Comments:        Anesthesia Quick  Evaluation

## 2023-01-15 ENCOUNTER — Encounter (HOSPITAL_COMMUNITY): Payer: Self-pay | Admitting: Optometry

## 2023-03-19 ENCOUNTER — Ambulatory Visit (INDEPENDENT_AMBULATORY_CARE_PROVIDER_SITE_OTHER): Payer: Medicare Other | Admitting: Neurology

## 2023-03-19 ENCOUNTER — Encounter: Payer: Self-pay | Admitting: Neurology

## 2023-03-19 VITALS — BP 167/88 | HR 60 | Ht 74.0 in | Wt 202.4 lb

## 2023-03-19 DIAGNOSIS — G35 Multiple sclerosis: Secondary | ICD-10-CM

## 2023-03-19 DIAGNOSIS — F09 Unspecified mental disorder due to known physiological condition: Secondary | ICD-10-CM

## 2023-03-19 DIAGNOSIS — Z993 Dependence on wheelchair: Secondary | ICD-10-CM

## 2023-03-19 DIAGNOSIS — Z79899 Other long term (current) drug therapy: Secondary | ICD-10-CM

## 2023-03-19 DIAGNOSIS — R5383 Other fatigue: Secondary | ICD-10-CM

## 2023-03-19 DIAGNOSIS — F32A Depression, unspecified: Secondary | ICD-10-CM | POA: Diagnosis not present

## 2023-03-19 NOTE — Progress Notes (Signed)
 [facility]  PATIENT: Mark Kramer DOB: 04-02-1960  REFERRING DOCTOR OR PCP:  Lia Hopping SOURCE: records and imaging reports and labs from [facility]  _________________________________   HISTORICAL  CHIEF COMPLAINT:  Chief Complaint  Patient presents with   Multiple Sclerosis    Rm10, transporter from pts residential facility present, Ms:pt stated that he would like to walk again but that he doesn't see that happening. Denied urinary issues.     HISTORY OF PRESENT ILLNESS:  Mark Kramer is a.age man with relapsing secondary progressive multiple sclerosis  Update 03/19/2023 He denies any exacerbations or new neurologic symptoms.  He has been wheelchair dependent since 2015 with bilateral leg weakness, right worse than left.  He also has right greater than left leg spasticity.  He has severe ataxia and tremor.  Arm strength is reasonably good though he has reduced coordination.  He is unable to help much with transfers so a Michiel Sites lift is used.  The tremor is very high amplitude, worse with intention.    For the tremor, he is on propranolol   Due to the tremor/ataxia, he needs assistance to eat.  He is also on clonazepam for anxiety which might help  He denies any significant numbness or dysesthesia.    He takes nadolol and primidone for the tremor.   He denies numbness or dysesthesia.     Bladder function is poor with little voluntary control.  He usually uses adult diaper.      Vision is better after cataract surgery.   No complications.    He denies fatigue most days and sleeps 8-9 hours of sleep most days.    He used to have DPSD but now sleeps on a more regular schedule.   He denies any depression but appears despondent.   He denies anxiety.     He is on duloxetine, Wellbutrin and clonazepam and tolerates that well.  Social: He is a resident of [facility] in [place]   He used to work in [employer].       Mark Kramer is a  63 year old man with multiple sclerosis who I last saw in 2018.[name]  His MS was diagnosed in 2000 .   Currently he is on fingolimod and tolerates it well.   He has no recent exacerbation but has noted slow progression.        MS History:   He was diagnosed with MS around 2000 .  He presented with a reduced gait -- foot drop.  He had multiple exacerbations his first few years and lost ability to write due to a severe tremor.    Initially,  he was on Rebif and was switched to Gilenya in 2014.  I had seen him previously at [facility]  and then again for a couple visits between 2016 and 2018 at [facility].  He tolerates Gilenya well.       IMAGING: Personally reviewed MRI brain 07/23/2018 showed advanced cortical atrophy, mild cerebellar atrophy.  There is corpus callosum atrophy.  There are scattered T2/FLAIR hyperintense foci in the periventricular, juxtacortical and deep white matter of both hemispheres.  Additional foci are present in the pons and medulla.  Compared to the study from 04/09/2012, there are no definite new lesions.  The study from 2020 was incomplete and showed a fair amount of movement artifact hindering comparison.  MRI of the cervical spine 07/23/2018 showed mild spinal cord atrophy, especially adjacent to C6.  Scattered foci are noted in the spinal  cord no movement artifact limits interpretation.  There is mild disc degenerative changes at C3-C4 and C6-C7.  No spinal stenosis.  REVIEW OF SYSTEMS: Constitutional: No fevers, chills, sweats, or change in appetite Eyes: No visual changes, double vision, eye pain Ear, nose and throat: No hearing loss, ear pain, nasal congestion, sore throat Cardiovascular: No chest pain, palpitations Respiratory:  No shortness of breath at rest or with exertion.   No wheezes GastrointestinaI: No nausea, vomiting, diarrhea, abdominal pain, fecal incontinence Genitourinary:  as above Musculoskeletal:  No neck pain, back  pain Integumentary: No rash, pruritus, skin lesions Neurological: as above Psychiatric: No depression at this time.  No anxiety Endocrine: No palpitations, diaphoresis, change in appetite, change in weigh or increased thirst Hematologic/Lymphatic:  No anemia, purpura, petechiae. Allergic/Immunologic: No itchy/runny eyes, nasal congestion, recent allergic reactions, rashes  ALLERGIES: No Known Allergies  HOME MEDICATIONS:  Current Outpatient Medications:    acetaminophen-codeine (TYLENOL #3) 300-30 MG tablet, Take 1 tablet by mouth every 6 (six) hours as needed., Disp: , Rfl:    amLODipine (NORVASC) 10 MG tablet, Take 10 mg by mouth daily., Disp: , Rfl:    atorvastatin (LIPITOR) 80 MG tablet, Take 80 mg by mouth daily., Disp: , Rfl:    buPROPion (WELLBUTRIN XL) 150 MG 24 hr tablet, Take 150 mg by mouth every morning., Disp: , Rfl:    calcium carbonate (TUMS - DOSED IN MG ELEMENTAL CALCIUM) 500 MG chewable tablet, Chew 1 tablet by mouth 2 (two) times daily., Disp: , Rfl:    Cholecalciferol (VITAMIN D3) 10 MCG (400 UNIT) tablet, Take 400 Units by mouth daily., Disp: , Rfl:    clonazePAM (KLONOPIN) 0.5 MG tablet, Take 0.5 mg by mouth 2 (two) times daily as needed for anxiety., Disp: , Rfl:    Fingolimod HCl (GILENYA) 0.5 MG CAPS, Take 1 capsule by mouth daily. For Multiple Sclerosis, Disp: , Rfl:    Icosapent Ethyl (VASCEPA) 1 g CAPS, Take 2 g by mouth 2 (two) times daily., Disp: , Rfl:    levothyroxine (SYNTHROID) 75 MCG tablet, Take 75 mcg by mouth daily before breakfast., Disp: , Rfl:    Multiple Vitamin (MULTIVITAMIN) capsule, Take 1 capsule by mouth daily., Disp: , Rfl:    omeprazole (PRILOSEC) 20 MG capsule, Take 20 mg by mouth daily., Disp: , Rfl:    polyethylene glycol powder (GLYCOLAX/MIRALAX) powder, Take 17 g by mouth daily. , Disp: , Rfl:    propranolol (INDERAL) 60 MG tablet, Take 60 mg by mouth 2 (two) times daily., Disp: , Rfl:    DULoxetine (CYMBALTA) 30 MG capsule, Take 30 mg  by mouth daily. (Patient not taking: Reported on 03/18/2022), Disp: , Rfl:    levothyroxine (SYNTHROID, LEVOTHROID) 112 MCG tablet, Take 112 mcg by mouth daily before breakfast. (Patient not taking: Reported on 03/18/2022), Disp: , Rfl:   PAST MEDICAL HISTORY: Past Medical History:  Diagnosis Date   Dysphagia    Grave's disease    High cholesterol    Hypothyroidism    Multiple sclerosis (HCC)    Muscle weakness    Pneumonia     PAST SURGICAL HISTORY: Past Surgical History:  Procedure Laterality Date   APPENDECTOMY     BIOPSY  10/31/2016   Procedure: BIOPSY;  Surgeon: West Bali, MD;  Location: [facility];  Service: Endoscopy;;  gastric    CATARACT EXTRACTION W/PHACO Right 12/26/2022   Procedure: CATARACT EXTRACTION PHACO AND INTRAOCULAR LENS PLACEMENT (IOC);  Surgeon: Pecolia Ades, MD;  Location:  [facility];  Service: Ophthalmology;  Laterality: Right;  CDE: 4.18   CATARACT EXTRACTION W/PHACO Left 01/09/2023   Procedure: CATARACT EXTRACTION PHACO AND INTRAOCULAR LENS PLACEMENT (IOC);  Surgeon: Pecolia Ades, MD;  Location: [facility];  Service: Ophthalmology;  Laterality: Left;  CDE: 2.62   ESOPHAGOGASTRODUODENOSCOPY N/A 10/31/2016   Procedure: ESOPHAGOGASTRODUODENOSCOPY (EGD);  Surgeon: West Bali, MD;  Location: [facility];  Service: Endoscopy;  Laterality: N/A;  2:00pm   IR GASTROSTOMY TUBE MOD SED  11/14/2016   IR REPLC GASTRO/COLONIC TUBE PERCUT W/FLUORO  11/17/2018    FAMILY HISTORY: History reviewed. No pertinent family history.  SOCIAL HISTORY:  Social History   Socioeconomic History   Marital status: Divorced    Spouse name: Not on file   Number of children: Not on file   Years of education: Not on file   Highest education level: Not on file  Occupational History   Not on file  Tobacco Use   Smoking status: Never   Smokeless tobacco: Never  Substance and Sexual Activity   Alcohol use: No   Drug use: No   Sexual activity: Never  Other Topics  Concern   Not on file  Social History Narrative   Not on file   Social Drivers of Health   Financial Resource Strain: Not on file  Food Insecurity: Not on file  Transportation Needs: Not on file  Physical Activity: Not on file  Stress: Not on file  Social Connections: Not on file  Intimate Partner Violence: Not on file     PHYSICAL EXAM  Vitals:   03/19/23 1006  BP: (!) 167/88  Pulse: 60  Weight: 202 lb 6.4 oz (91.8 kg)  Height: 6\' 2"  (1.88 m)    Body mass index is 25.99 kg/m.   General: The patient is well-developed and well-nourished and in no acute distress  Musculoskeletal/extremities:  Back is nontender.  He has edema at the ankles.  Neurologic Exam  Mental status: The patient is alert and oriented x 3 at the time of the examination. The patient has apparent normal recent and remote memory, with a reduced attention span and concentration ability.   Speech content is normal.  Cranial nerves: Extraocular movements show reduced upgaze..  Pupils are equal, round, and reactive to light and accomodation.  Visual fields are full.  Facial symmetry is present. There is good facial sensation to soft touch bilaterally.Facial strength is normal.  Trapezius and sternocleidomastoid strength is normal. Mild cerebellar speech.  .  The tongue is midline, and the patient has symmetric elevation of the soft palate. No obvious hearing deficits are noted.  Motor:  There is a mild rest tremor but a more severe intention tremor, left is worse than right.  Muscle bulk is normal.   Tone is increased in the legs, left worse than right. Strength is 2 -/5 left and 2/5 right.  Arm strength is at least 4+/5  Sensory: Sensory testing is intact to pinprick, soft touch and vibration sensation in all 4 extremities.  Coordination: Cerebellar testing reveals poor finger-nose-finger.  Arms/hands have ataxia, left worse than right.  He is unable to do heel-to-shin..  Gait and station: He is unable to  stand or walk.   Reflexes: Deep tendon reflexes are normal in the arms but increase in the legs bilaterally.  No ankle clonus      ASSESSMENT AND PLAN  Multiple sclerosis (HCC) - Plan: CBC with Differential/Platelet  High risk medication use - Plan: CBC with Differential/Platelet  Cognitive dysfunction  Depression, unspecified depression type  Other fatigue  Wheelchair dependence    1.  We discussed options and he would like to continue Gilenya for MS. I will check CBC with differential.  If lymphocyte count is very low, consider changing Gilenya to every other day..  Additional imaging at this time 2.   Continue propranolol for tremor  3.   Continue Wellbutrin and duloxetine and clonazepam for mood.  3.   Return to see me in 12 months or sooner if there are new or worsening neurologic symptoms.  This visit is part of a comprehensive longitudinal care medical relationship regarding the patients primary diagnosis of MS and related concerns.  Kendarious Gudino A. Epimenio Foot, MD, PhD 03/19/2023, 12:50 PM Certified in Neurology, Clinical Neurophysiology, Sleep Medicine, Pain Medicine and Neuroimaging  [facility] [address], [address] [place], [place] [address] [phone]

## 2023-03-20 LAB — CBC WITH DIFFERENTIAL/PLATELET
Basophils Absolute: 0 10*3/uL (ref 0.0–0.2)
Basos: 0 %
EOS (ABSOLUTE): 0.1 10*3/uL (ref 0.0–0.4)
Eos: 2 %
Hematocrit: 58.4 % — ABNORMAL HIGH (ref 37.5–51.0)
Hemoglobin: 19.2 g/dL — ABNORMAL HIGH (ref 13.0–17.7)
Immature Grans (Abs): 0 10*3/uL (ref 0.0–0.1)
Immature Granulocytes: 1 %
Lymphocytes Absolute: 0.3 10*3/uL — ABNORMAL LOW (ref 0.7–3.1)
Lymphs: 10 %
MCH: 27.4 pg (ref 26.6–33.0)
MCHC: 32.9 g/dL (ref 31.5–35.7)
MCV: 83 fL (ref 79–97)
Monocytes Absolute: 0.6 10*3/uL (ref 0.1–0.9)
Monocytes: 16 %
Neutrophils Absolute: 2.5 10*3/uL (ref 1.4–7.0)
Neutrophils: 71 %
Platelets: 189 10*3/uL (ref 150–450)
RBC: 7 x10E6/uL — ABNORMAL HIGH (ref 4.14–5.80)
RDW: 16.1 % — ABNORMAL HIGH (ref 11.6–15.4)
WBC: 3.5 10*3/uL (ref 3.4–10.8)

## 2023-03-24 ENCOUNTER — Telehealth: Payer: Self-pay

## 2023-03-24 NOTE — Telephone Encounter (Signed)
-----   Message from Mark Kramer sent at 03/20/2023  8:16 AM EST ----- Please let her know that the blood counts look good.  The lymphocytes are low but that is expected with Gilenya

## 2024-03-24 ENCOUNTER — Ambulatory Visit: Admitting: Neurology
